# Patient Record
Sex: Male | Born: 1960 | Race: Black or African American | Hispanic: No | Marital: Single | Smoking: Current every day smoker
Health system: Southern US, Community
[De-identification: ages and names within clinical notes are randomized; demographics above are authoritative.]

## PROBLEM LIST (undated history)

## (undated) DIAGNOSIS — I10 Essential (primary) hypertension: Secondary | ICD-10-CM

## (undated) DIAGNOSIS — K852 Alcohol induced acute pancreatitis without necrosis or infection: Secondary | ICD-10-CM

## (undated) DIAGNOSIS — F1010 Alcohol abuse, uncomplicated: Secondary | ICD-10-CM

---

## 1999-12-11 ENCOUNTER — Emergency Department (HOSPITAL_COMMUNITY): Admission: EM | Admit: 1999-12-11 | Discharge: 1999-12-11 | Payer: Self-pay | Admitting: Emergency Medicine

## 2003-07-14 ENCOUNTER — Emergency Department (HOSPITAL_COMMUNITY): Admission: EM | Admit: 2003-07-14 | Discharge: 2003-07-14 | Payer: Self-pay

## 2018-01-30 ENCOUNTER — Emergency Department (HOSPITAL_COMMUNITY): Payer: BLUE CROSS/BLUE SHIELD

## 2018-01-30 ENCOUNTER — Emergency Department (HOSPITAL_COMMUNITY)
Admission: EM | Admit: 2018-01-30 | Discharge: 2018-01-30 | Disposition: A | Discharge status: Home / Self Care | Payer: BLUE CROSS/BLUE SHIELD | Attending: Emergency Medicine | Admitting: Emergency Medicine

## 2018-01-30 ENCOUNTER — Other Ambulatory Visit: Payer: Self-pay

## 2018-01-30 ENCOUNTER — Encounter (HOSPITAL_COMMUNITY): Payer: Self-pay | Admitting: *Deleted

## 2018-01-30 DIAGNOSIS — R221 Localized swelling, mass and lump, neck: Secondary | ICD-10-CM

## 2018-01-30 DIAGNOSIS — I159 Secondary hypertension, unspecified: Secondary | ICD-10-CM

## 2018-01-30 DIAGNOSIS — I10 Essential (primary) hypertension: Secondary | ICD-10-CM | POA: Diagnosis not present

## 2018-01-30 DIAGNOSIS — M25511 Pain in right shoulder: Secondary | ICD-10-CM | POA: Insufficient documentation

## 2018-01-30 HISTORY — DX: Essential (primary) hypertension: I10

## 2018-01-30 LAB — BASIC METABOLIC PANEL
ANION GAP: 13 (ref 5–15)
BUN: 7 mg/dL (ref 6–20)
CHLORIDE: 99 mmol/L — AB (ref 101–111)
CO2: 24 mmol/L (ref 22–32)
Calcium: 9.9 mg/dL (ref 8.9–10.3)
Creatinine, Ser: 0.61 mg/dL (ref 0.61–1.24)
GFR calc Af Amer: >60 mL/min (ref >60)
GLUCOSE: 132 mg/dL — AB (ref 65–99)
POTASSIUM: 3.5 mmol/L (ref 3.5–5.1)
Sodium: 136 mmol/L (ref 135–145)

## 2018-01-30 LAB — I-STAT TROPONIN, ED: Troponin i, poc: 0.01 ng/mL (ref 0.00–0.08)

## 2018-01-30 LAB — CBC
HEMATOCRIT: 45.8 % (ref 39.0–52.0)
HEMOGLOBIN: 15.8 g/dL (ref 13.0–17.0)
MCH: 31.2 pg (ref 26.0–34.0)
MCHC: 34.5 g/dL (ref 30.0–36.0)
MCV: 90.3 fL (ref 78.0–100.0)
Platelets: 257 10*3/uL (ref 150–400)
RBC: 5.07 MIL/uL (ref 4.22–5.81)
RDW: 13.1 % (ref 11.5–15.5)
WBC: 3.8 10*3/uL — ABNORMAL LOW (ref 4.0–10.5)

## 2018-01-30 LAB — TSH: TSH: 0.913 u[IU]/mL (ref 0.350–4.500)

## 2018-01-30 LAB — T4, FREE: Free T4: 0.78 ng/dL (ref 0.61–1.12)

## 2018-01-30 MED ORDER — HYDROCHLOROTHIAZIDE 25 MG PO TABS
25.0000 mg | ORAL_TABLET | Freq: Every day | ORAL | Status: DC
  Administered 2018-01-30: 25 mg via ORAL
  Filled 2018-01-30: qty 1

## 2018-01-30 MED ORDER — HYDROCHLOROTHIAZIDE 25 MG PO TABS: 25.0000 mg | ORAL_TABLET | Freq: Every day | ORAL | 0 refills | Status: DC

## 2018-01-30 NOTE — ED Provider Notes (Signed)
[facility]  CSN: [id number] Arrival date & time: 01/30/18 1322  Chief Complaint(s) Hypertension  HPI Rochele RaringJerry L Vader is a 57 y.o. male with a history of uncontrolled hypertension presents to the emergency department from urgent care for significant hypotension with systolics in the 200s.  He presented to urgent care for right shoulder pain that has been ongoing for 2 weeks after he hit it on a door frame while walking.  Pain is exacerbated with palpation of the posterior right shoulder and range of motion.  Denies any additional trauma.  Denies any chest pain, shortness of breath, headache, abdominal pain swelling.  Denies any other physical complaints.  Plain film was obtained at the urgent care which also noted rightward deviation of the trachea.  Patient denies any known thyroid problems.  Denies any trouble swallowing or respiratory distress.  Patient states that he used to be on hydrochlorothiazide.  Stop taking it due to losing his primary care provider.  HPI  Right should pain, hit it 2 weeks ago.  UC noted HTN and right trache deviation on CXR Past Medical History Past Medical History:  Diagnosis Date  . Hypertension    There are no active problems to display for this patient.  Home Medication(s) Prior to Admission medications   Medication Sig Start Date End Date Taking? Authorizing Provider  acetaminophen (TYLENOL) 650 MG CR tablet Take 650 mg by mouth every 8 (eight) hours as needed for pain.   Yes [provider]  Multiple Vitamins-Minerals (MULTIVITAMIN WITH MINERALS) tablet Take 1 tablet by mouth daily.   Yes [provider]  Omega-3 Fatty Acids (FISH OIL) 1000 MG CAPS Take 2,000 mg by mouth daily.   Yes [provider]  hydrochlorothiazide (HYDRODIURIL) 25 MG tablet Take 1 tablet (25 mg total) by mouth daily. 01/30/18 03/01/18  Nira Connardama, Jc Veron Eduardo, MD                                                                                                Past Surgical History History reviewed. No pertinent surgical history. Family History No family history on file.  Social History Social History   Tobacco Use  . Smoking status: Not on file  Substance Use Topics  . Alcohol use: Not on file  . Drug use: Not on file   Allergies Patient has no known allergies.  Review of Systems Review of Systems All other systems are reviewed and are negative for acute change except as noted in the HPI  Physical Exam Vital Signs  I have reviewed the triage vital signs BP (!) 173/92   Pulse (!) 120   Temp 98.5 F (36.9 C)   Resp (!) 26   SpO2 99%   Physical Exam  Constitutional: He is oriented to person, place, and time. He appears well-developed and well-nourished. No distress.  HENT:  Head: Normocephalic and atraumatic.  Nose: Nose normal.  Eyes: Conjunctivae and EOM are normal. Pupils are equal, round, and reactive to light. Right eye exhibits no discharge. Left eye exhibits no discharge. No scleral icterus.  Neck: Normal  range of motion. Neck supple. Thyroid mass (just left of CT cartilage) present.  Cardiovascular: Normal rate and regular rhythm. Exam reveals no gallop and no friction rub.  No murmur heard. Pulmonary/Chest: Effort normal and breath sounds normal. No stridor. No respiratory distress. He has no rales.  Abdominal: Soft. He exhibits no distension. There is no tenderness.  Musculoskeletal: He exhibits no edema.       Right shoulder: He exhibits tenderness (mild. to posterior deltoid.). He exhibits normal range of motion, no bony tenderness, no crepitus and no deformity.  Neurological: He is alert and oriented to person, place, and time.  Skin: Skin is warm and dry. No rash noted. He is not diaphoretic. No erythema.  Psychiatric: He has a normal mood and affect.  Vitals reviewed.   ED Results and Treatments Labs (all labs  ordered are listed, but only abnormal results are displayed) Labs Reviewed  BASIC METABOLIC PANEL - Abnormal; Notable for the following components:      Result Value   Chloride 99 (*)    Glucose, Bld 132 (*)    All other components within normal limits  CBC - Abnormal; Notable for the following components:   WBC 3.8 (*)    All other components within normal limits  TSH  T4, FREE  I-STAT TROPONIN, ED                                                                                                                         EKG  EKG Interpretation  Date/Time:  Tuesday January 30 2018 13:33:29 EST Ventricular Rate:  112 PR Interval:  196 QRS Duration: 86 QT Interval:  326 QTC Calculation: 444 R Axis:   -55 Text Interpretation:  Sinus tachycardia Left anterior fascicular block Minimal voltage criteria for LVH, may be normal variant Cannot rule out Anterior infarct , age undetermined Abnormal ECG NO STEMI No old tracing to compare Confirmed by Drema Pry [phone]) on 01/30/2018 1:58:50 PM      Radiology No results found. Pertinent labs & imaging results that were available during my care of the patient were reviewed by me and considered in my medical decision making (see chart for details).  Medications Ordered in ED Medications - No data to display  Procedures Procedures  (including critical care time)  Medical Decision Making / ED Course I have reviewed the nursing notes for this encounter and the patient's prior records (if available in EHR or on provided paperwork).    Patient here with asymptomatic hypertension.  Labs without evidence of endorgan damage.  Plain film without acute injuries.  Nodular thyroid noted with normal thyroid panel. Patient was given home dose of hydrochlorothiazide with improved blood pressures. Recommended he establish care  and follow-up with a primary care provider for continued management of his pressure and further workup and management of possible thyroid nodule.  The patient appears reasonably screened and/or stabilized for discharge and I doubt any other medical condition or other [facility] requiring further screening, evaluation, or treatment in the ED at this time prior to discharge.  The patient is safe for discharge with strict return precautions.   Final Clinical Impression(s) / ED Diagnoses Final diagnoses:  Secondary hypertension  Mass of thyroid region   Disposition: Discharge  Condition: Good  I have discussed the results, Dx and Tx plan with the patient who expressed understanding and agree(s) with the plan. Discharge instructions discussed at great length. The patient was given strict return precautions who verbalized understanding of the instructions. No further questions at time of discharge.    ED Discharge Orders        Ordered    hydrochlorothiazide (HYDRODIURIL) 25 MG tablet  Daily     01/30/18 1603       Follow Up: Primary care provider  Call  For close follow up to assess for the thyroid mass noted during your visit      This chart was dictated using voice recognition software.  Despite best efforts to proofread,  errors can occur which can change the documentation meaning.   Nira Conn, MD 01/31/18 [phone]

## 2018-01-30 NOTE — ED Triage Notes (Signed)
Pt reports having elevated BP and rt shoulder and neck pain. [facility] Pt reports this has been ongoing for 1 week. Pt states he has not taken medication in >876months.

## 2018-01-30 NOTE — ED Notes (Signed)
Patient transported to X-ray 

## 2019-01-07 IMAGING — DX DG CHEST 2V
2 series · 2 of 2 positions shown · non-contrast
Comparison: None.

CLINICAL DATA: Chest pain

EXAM:
CHEST  2 VIEW

[chest pa]
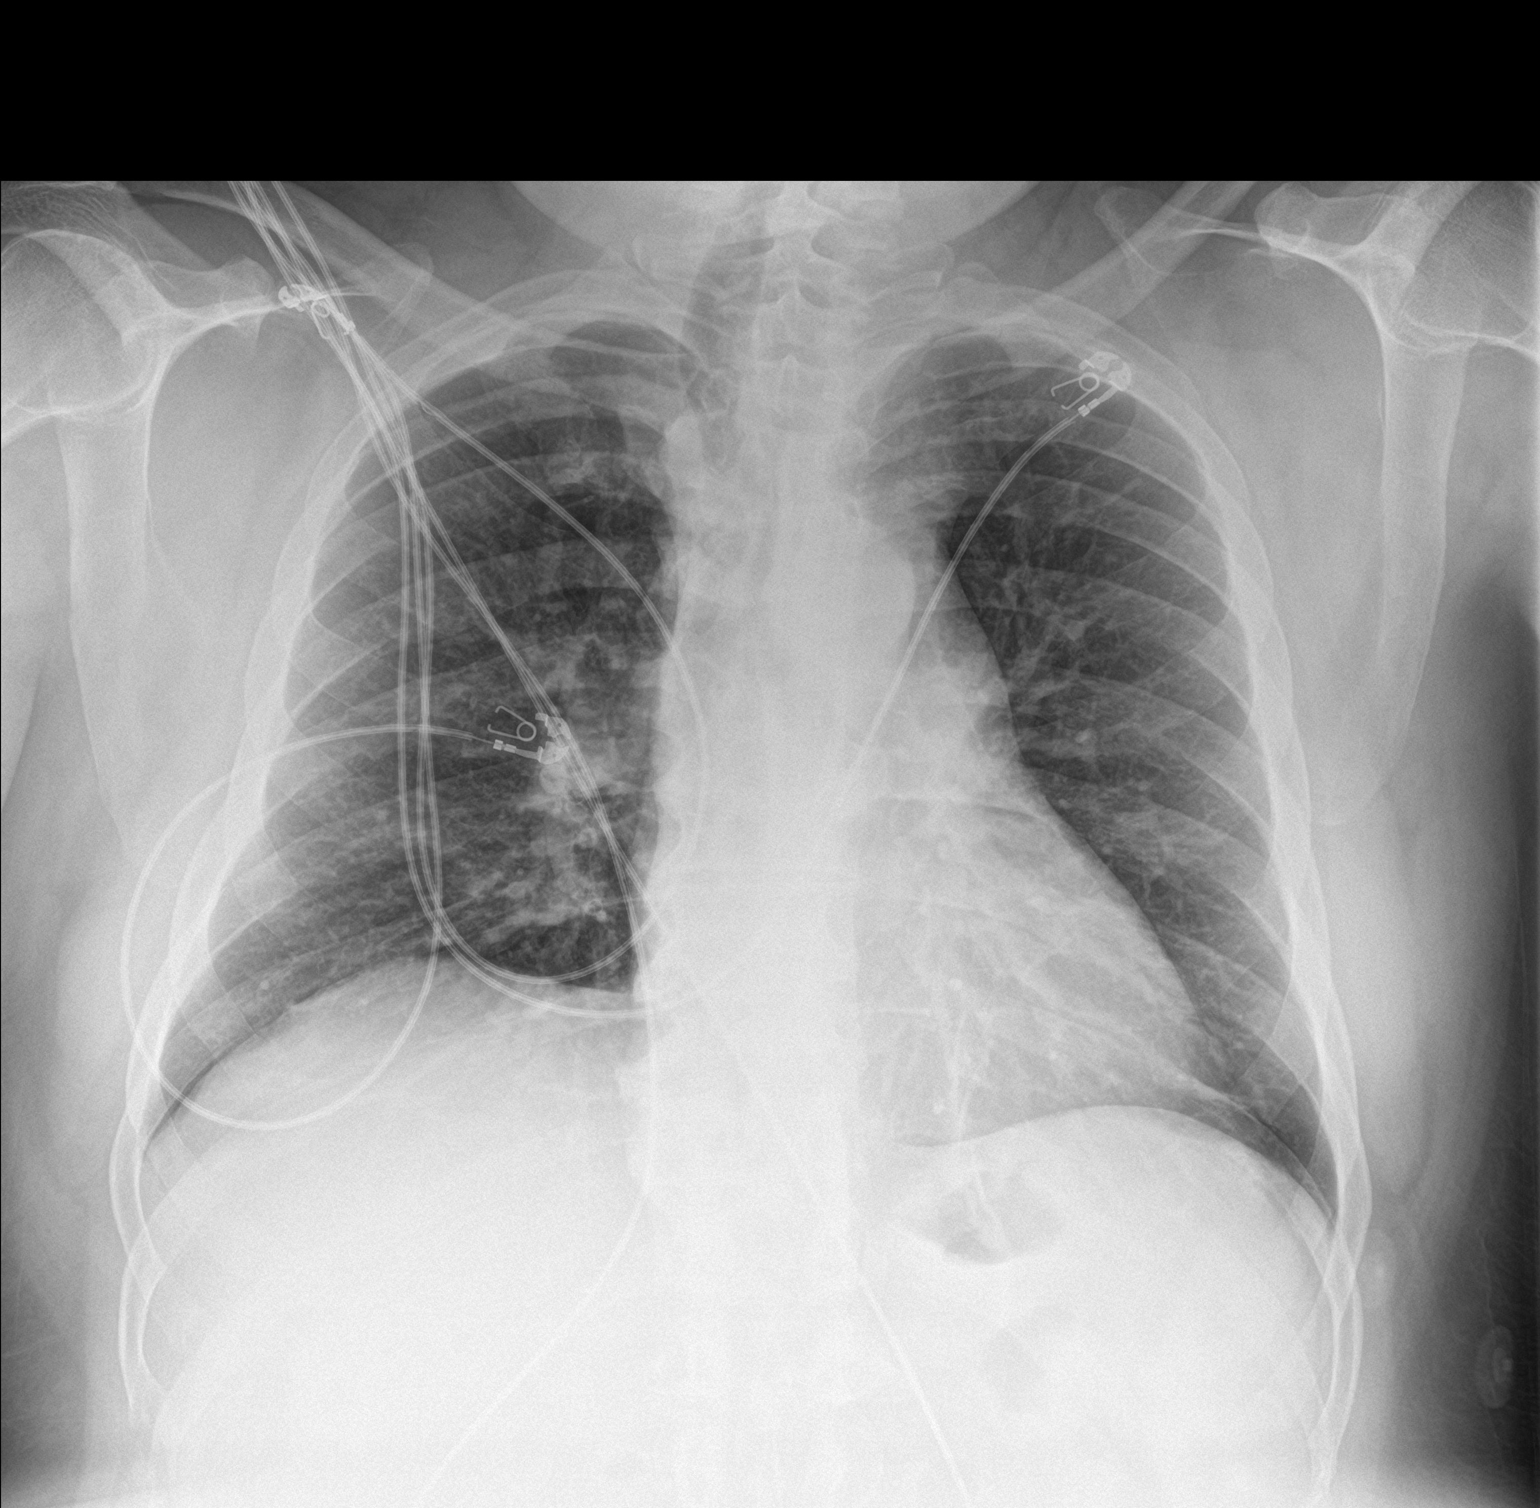

[chest lat]
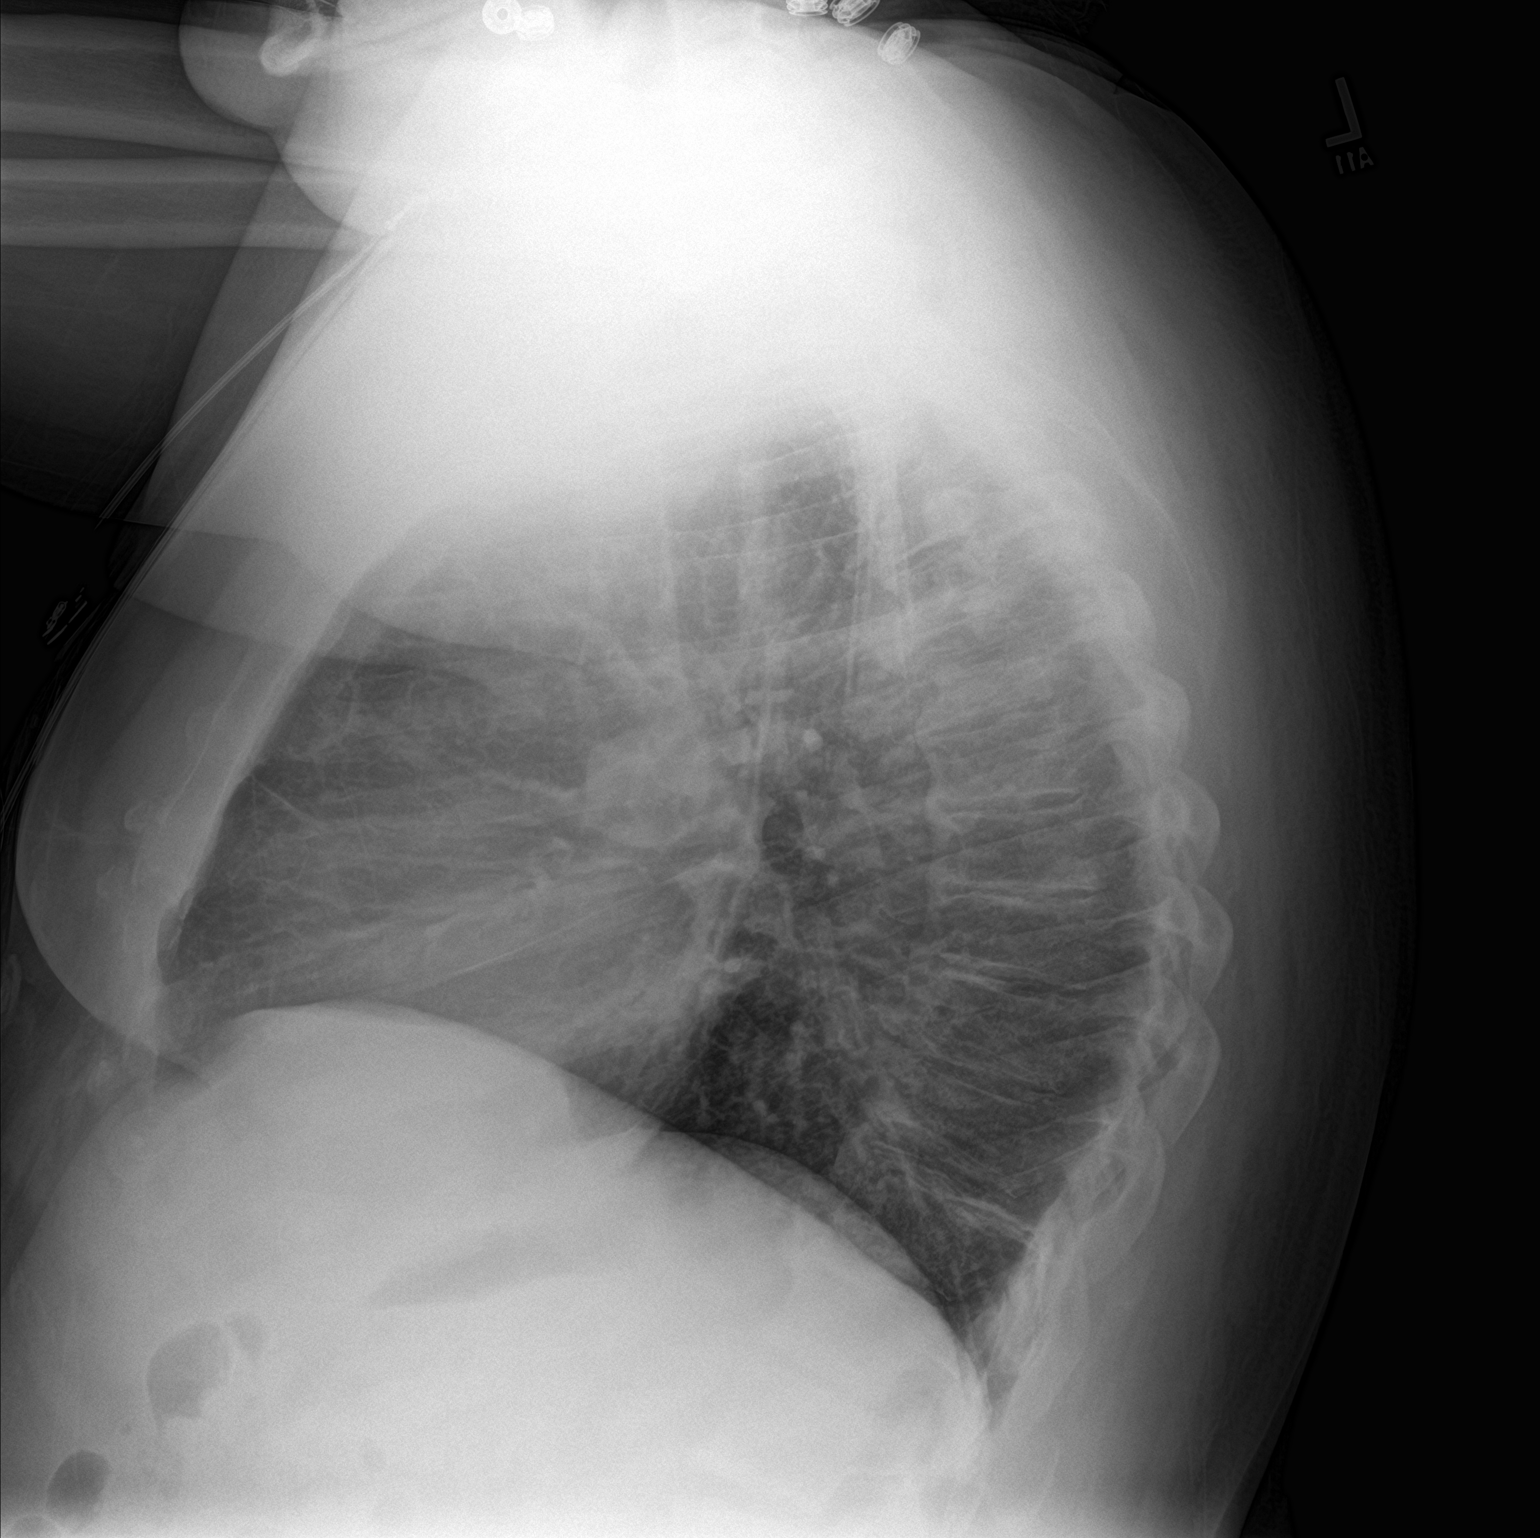

[2 of 2 positions shown; findings below may reference images not displayed]

FINDINGS: There is no edema or consolidation. The heart size and pulmonary
vascularity are normal. No adenopathy. There is degenerative change
in the thoracic spine. There is rightward deviation of the upper
thoracic trachea.
IMPRESSION: No edema or consolidation. Rightward deviation of the upper thoracic
trachea noted. Suspect thyroid enlargement causing this deviation.

## 2023-03-31 ENCOUNTER — Emergency Department (HOSPITAL_COMMUNITY): Payer: Self-pay

## 2023-03-31 ENCOUNTER — Inpatient Hospital Stay (HOSPITAL_COMMUNITY)
Admission: EM | Admit: 2023-03-31 | Discharge: 2023-04-05 | DRG: 439 | Disposition: A | Discharge status: Home / Self Care | Payer: Self-pay | Attending: Internal Medicine | Admitting: Internal Medicine

## 2023-03-31 ENCOUNTER — Other Ambulatory Visit: Payer: Self-pay

## 2023-03-31 DIAGNOSIS — Z5982 Transportation insecurity: Secondary | ICD-10-CM

## 2023-03-31 DIAGNOSIS — E876 Hypokalemia: Secondary | ICD-10-CM | POA: Insufficient documentation

## 2023-03-31 DIAGNOSIS — N200 Calculus of kidney: Secondary | ICD-10-CM | POA: Diagnosis present

## 2023-03-31 DIAGNOSIS — N400 Enlarged prostate without lower urinary tract symptoms: Secondary | ICD-10-CM | POA: Diagnosis present

## 2023-03-31 DIAGNOSIS — R7401 Elevation of levels of liver transaminase levels: Secondary | ICD-10-CM | POA: Insufficient documentation

## 2023-03-31 DIAGNOSIS — R680 Hypothermia, not associated with low environmental temperature: Secondary | ICD-10-CM | POA: Diagnosis present

## 2023-03-31 DIAGNOSIS — R599 Enlarged lymph nodes, unspecified: Secondary | ICD-10-CM | POA: Diagnosis present

## 2023-03-31 DIAGNOSIS — W19XXXA Unspecified fall, initial encounter: Secondary | ICD-10-CM | POA: Diagnosis present

## 2023-03-31 DIAGNOSIS — Z781 Physical restraint status: Secondary | ICD-10-CM

## 2023-03-31 DIAGNOSIS — R296 Repeated falls: Secondary | ICD-10-CM | POA: Diagnosis present

## 2023-03-31 DIAGNOSIS — K8500 Idiopathic acute pancreatitis without necrosis or infection: Secondary | ICD-10-CM

## 2023-03-31 DIAGNOSIS — E669 Obesity, unspecified: Secondary | ICD-10-CM | POA: Diagnosis present

## 2023-03-31 DIAGNOSIS — K7460 Unspecified cirrhosis of liver: Secondary | ICD-10-CM | POA: Diagnosis present

## 2023-03-31 DIAGNOSIS — K8080 Other cholelithiasis without obstruction: Secondary | ICD-10-CM

## 2023-03-31 DIAGNOSIS — R791 Abnormal coagulation profile: Secondary | ICD-10-CM | POA: Diagnosis present

## 2023-03-31 DIAGNOSIS — F1010 Alcohol abuse, uncomplicated: Secondary | ICD-10-CM | POA: Insufficient documentation

## 2023-03-31 DIAGNOSIS — Z7141 Alcohol abuse counseling and surveillance of alcoholic: Secondary | ICD-10-CM

## 2023-03-31 DIAGNOSIS — F10139 Alcohol abuse with withdrawal, unspecified: Secondary | ICD-10-CM | POA: Diagnosis not present

## 2023-03-31 DIAGNOSIS — I700 Atherosclerosis of aorta: Secondary | ICD-10-CM | POA: Diagnosis present

## 2023-03-31 DIAGNOSIS — T68XXXA Hypothermia, initial encounter: Secondary | ICD-10-CM | POA: Insufficient documentation

## 2023-03-31 DIAGNOSIS — I10 Essential (primary) hypertension: Secondary | ICD-10-CM | POA: Insufficient documentation

## 2023-03-31 DIAGNOSIS — E8729 Other acidosis: Secondary | ICD-10-CM | POA: Insufficient documentation

## 2023-03-31 DIAGNOSIS — E46 Unspecified protein-calorie malnutrition: Secondary | ICD-10-CM

## 2023-03-31 DIAGNOSIS — R5381 Other malaise: Secondary | ICD-10-CM | POA: Diagnosis present

## 2023-03-31 DIAGNOSIS — E8809 Other disorders of plasma-protein metabolism, not elsewhere classified: Secondary | ICD-10-CM | POA: Insufficient documentation

## 2023-03-31 DIAGNOSIS — R410 Disorientation, unspecified: Secondary | ICD-10-CM | POA: Diagnosis not present

## 2023-03-31 DIAGNOSIS — K8020 Calculus of gallbladder without cholecystitis without obstruction: Secondary | ICD-10-CM | POA: Diagnosis present

## 2023-03-31 DIAGNOSIS — I878 Other specified disorders of veins: Secondary | ICD-10-CM | POA: Diagnosis present

## 2023-03-31 DIAGNOSIS — K760 Fatty (change of) liver, not elsewhere classified: Secondary | ICD-10-CM | POA: Diagnosis present

## 2023-03-31 DIAGNOSIS — E041 Nontoxic single thyroid nodule: Secondary | ICD-10-CM | POA: Insufficient documentation

## 2023-03-31 DIAGNOSIS — R6510 Systemic inflammatory response syndrome (SIRS) of non-infectious origin without acute organ dysfunction: Secondary | ICD-10-CM | POA: Diagnosis present

## 2023-03-31 DIAGNOSIS — Z79899 Other long term (current) drug therapy: Secondary | ICD-10-CM

## 2023-03-31 DIAGNOSIS — K859 Acute pancreatitis without necrosis or infection, unspecified: Principal | ICD-10-CM | POA: Diagnosis present

## 2023-03-31 DIAGNOSIS — J9811 Atelectasis: Secondary | ICD-10-CM | POA: Diagnosis present

## 2023-03-31 LAB — COMPREHENSIVE METABOLIC PANEL
ALT: 53 U/L — ABNORMAL HIGH (ref 0–44)
AST: 180 U/L — ABNORMAL HIGH (ref 15–41)
Albumin: 2.7 g/dL — ABNORMAL LOW (ref 3.5–5.0)
Alkaline Phosphatase: 124 U/L (ref 38–126)
Anion gap: 18 — ABNORMAL HIGH (ref 5–15)
BUN: 8 mg/dL (ref 6–20)
CO2: 19 mmol/L — ABNORMAL LOW (ref 22–32)
Calcium: 7.9 mg/dL — ABNORMAL LOW (ref 8.9–10.3)
Chloride: 100 mmol/L (ref 98–111)
Creatinine, Ser: 0.63 mg/dL (ref 0.61–1.24)
GFR, Estimated: >60 mL/min (ref >60)
Glucose, Bld: 83 mg/dL (ref 70–99)
Potassium: 3 mmol/L — ABNORMAL LOW (ref 3.5–5.1)
Sodium: 137 mmol/L (ref 135–145)
Total Bilirubin: 7.5 mg/dL — ABNORMAL HIGH (ref 0.3–1.2)
Total Protein: 7.9 g/dL (ref 6.5–8.1)

## 2023-03-31 LAB — CBC WITH DIFFERENTIAL/PLATELET
Abs Immature Granulocytes: 0.03 10*3/uL (ref 0.00–0.07)
Basophils Absolute: 0.1 10*3/uL (ref 0.0–0.1)
Basophils Relative: 1 %
Eosinophils Absolute: 0 10*3/uL (ref 0.0–0.5)
Eosinophils Relative: 0 %
HCT: 38.9 % — ABNORMAL LOW (ref 39.0–52.0)
Hemoglobin: 13.5 g/dL (ref 13.0–17.0)
Immature Granulocytes: 1 %
Lymphocytes Relative: 12 %
Lymphs Abs: 0.6 10*3/uL — ABNORMAL LOW (ref 0.7–4.0)
MCH: 32.8 pg (ref 26.0–34.0)
MCHC: 34.7 g/dL (ref 30.0–36.0)
MCV: 94.6 fL (ref 80.0–100.0)
Monocytes Absolute: 0.3 10*3/uL (ref 0.1–1.0)
Monocytes Relative: 6 %
Neutro Abs: 3.9 10*3/uL (ref 1.7–7.7)
Neutrophils Relative %: 80 %
Platelets: 282 10*3/uL (ref 150–400)
RBC: 4.11 MIL/uL — ABNORMAL LOW (ref 4.22–5.81)
RDW: 13.8 % (ref 11.5–15.5)
WBC: 5 10*3/uL (ref 4.0–10.5)
nRBC: 0 % (ref 0.0–0.2)

## 2023-03-31 LAB — LIPASE, BLOOD: Lipase: 368 U/L — ABNORMAL HIGH (ref 11–51)

## 2023-03-31 MED ORDER — SODIUM CHLORIDE 0.9 % IV BOLUS
500.0000 mL | Freq: Once | INTRAVENOUS | Status: AC
  Administered 2023-03-31: 500 mL via INTRAVENOUS

## 2023-03-31 MED ORDER — SODIUM CHLORIDE (PF) 0.9 % IJ SOLN
INTRAMUSCULAR | Status: AC
  Filled 2023-03-31: qty 100

## 2023-03-31 MED ORDER — IOHEXOL 350 MG/ML SOLN
100.0000 mL | Freq: Once | INTRAVENOUS | Status: AC | PRN
  Administered 2023-03-31: 100 mL via INTRAVENOUS

## 2023-03-31 MED ORDER — SODIUM CHLORIDE 0.9 % IV BOLUS
1000.0000 mL | Freq: Once | INTRAVENOUS | Status: AC
  Administered 2023-03-31: 1000 mL via INTRAVENOUS

## 2023-03-31 NOTE — ED Provider Notes (Signed)
[place] EMERGENCY DEPARTMENT AT [facility] Provider Note   CSN: [id number] Arrival date & time: 03/31/23  1418     History  Chief Complaint  Patient presents with   Marletta Lor    LANDIN TALLON is a 62 y.o. male.  HPI   62 year old male with only reported medical history of HTN presents emergency department after reported fall 2 days ago with worsening weakness/debility.  Patient and his family member at bedside are poor historians.  Sometimes have contradicting stories.  Patient states that 2 days ago on Wednesday when he tried to get up from the commode in the morning he had a fall down onto his back.  He states he did not hit his head, there was no loss of consciousness.  However he was able to get off the ground by himself.  He typically ambulates independently.  He states that his son and wife had to help him into the living room which is where he has been for the past 2 days.  However he states he has been eating, drinking and compliant with his HTN medications.  He endorses generalized weakness and the family member at bedside has concern for "patient not seeming like himself".  The patient's sister is on her way and apparently has a more accurate history which I will attempt to get.  Home Medications Prior to Admission medications   Medication Sig Start Date End Date Taking? Authorizing Provider  acetaminophen (TYLENOL) 650 MG CR tablet Take 650 mg by mouth every 8 (eight) hours as needed for pain.    [provider]  hydrochlorothiazide (HYDRODIURIL) 25 MG tablet Take 1 tablet (25 mg total) by mouth daily. 01/30/18 03/01/18  Nira Conn, MD  Multiple Vitamins-Minerals (MULTIVITAMIN WITH MINERALS) tablet Take 1 tablet by mouth daily.    [provider]  Omega-3 Fatty Acids (FISH OIL) 1000 MG CAPS Take 2,000 mg by mouth daily.    [provider]      Allergies    Patient has no known allergies.    Review of Systems   Review of  Systems  Constitutional:  Positive for appetite change and fatigue. Negative for fever.  Respiratory:  Negative for shortness of breath.   Cardiovascular:  Negative for chest pain.  Gastrointestinal:  Negative for abdominal pain, diarrhea and vomiting.  Genitourinary:  Positive for frequency.  Musculoskeletal:  Negative for back pain, myalgias and neck pain.  Skin:  Negative for rash.  Neurological:  Negative for dizziness, syncope, numbness and headaches.    Physical Exam Updated Vital Signs BP (!) 156/91 (BP Location: Right Arm)   Pulse (!) 103   Temp (!) 93 F (33.9 C) (Rectal)   Resp 18   SpO2 98%  Physical Exam Vitals and nursing note reviewed.  Constitutional:      Appearance: He is obese.  HENT:     Head: Normocephalic.     Mouth/Throat:     Mouth: Mucous membranes are dry.  Eyes:     Extraocular Movements: Extraocular movements intact.     Pupils: Pupils are equal, round, and reactive to light.  Cardiovascular:     Rate and Rhythm: Tachycardia present.  Pulmonary:     Effort: Pulmonary effort is normal. No respiratory distress.     Comments: Decreased breath sounds b/l, exam limited by body habitus  Abdominal:     Palpations: Abdomen is soft.     Tenderness: There is no abdominal tenderness.  Musculoskeletal:  General: No deformity.     Cervical back: No rigidity or tenderness.     Comments: Chronic edema and skin breakdown of BLE with no open or weeping wounds, legs appear and feel vascularly in tact  Skin:    General: Skin is warm.  Neurological:     Mental Status: He is alert and oriented to person, place, and time. Mental status is at baseline.  Psychiatric:        Mood and Affect: Mood normal.     ED Results / Procedures / Treatments   Labs (all labs ordered are listed, but only abnormal results are displayed) Labs Reviewed  CBC WITH DIFFERENTIAL/PLATELET  COMPREHENSIVE METABOLIC PANEL  URINALYSIS, ROUTINE W REFLEX MICROSCOPIC     EKG None  Radiology No results found.  Procedures Procedures    Medications Ordered in ED Medications  sodium chloride 0.9 % bolus 500 mL (has no administration in time range)    ED Course/ Medical Decision Making/ A&P                             Medical Decision Making Amount and/or Complexity of Data Reviewed Labs: ordered. Radiology: ordered.  Risk Prescription drug management. Decision regarding hospitalization.   62 year old male presents emergency department with fall 2 days ago, weakness and general decline.  He is tachycardic on arrival, stable blood pressure, hypothermic.  Patient is a poor historian.  No acute traumatic findings on exam.  Blood work shows transaminitis consistent with daily alcohol use.  However he has an elevated bilirubin over 7. Presumed new.  CT imaging shows no traumatic injury however does identify pancreatitis, lipase is slightly elevated.  There is also identified cholelithiasis with gallbladder wall thickening.  CBD is measuring normal on ultrasound.  However given these findings consulted on-call gastroenterologist, Dr. Dulce Sellar.  He agrees with hospitalist admission and MRCP tomorrow for further evaluation.  Patient appears rather asymptomatic in regards to pancreatitis/gallbladder inflammation.  However he is amendable to staying, continues to be tachycardic, will continue with IV hydration and admit to hospitalist.  Patients evaluation and results requires admission for further treatment and care.  Spoke with hospitalist, reviewed patient's ED course and they accept admission.  Patient agrees with admission plan, offers no new complaints and is stable/unchanged at time of admit.        Final Clinical Impression(s) / ED Diagnoses Final diagnoses:  None    Rx / DC Orders ED Discharge Orders     None         Rozelle Logan, DO 03/31/23 2347

## 2023-03-31 NOTE — ED Notes (Signed)
Pt being transported to CT

## 2023-03-31 NOTE — H&P (Incomplete)
History and Physical    [name]: Brian [name], Brian [name]  [name] coming from: Home  Chief Complaint:  Chief Complaint  [name] presents with   Fall   HPI: Brian [name] is a 62 y.o. male with medical history significant of hypertension who presents to the emergency department due to increasing weakness which started about 2 days ago after sustaining a fall.  [name] was a poor historian and most of the history was obtained from ED physician and ED medical record.  [name] report, [name] sustained a fall to get up from the commode about 2 days ago, he fell on his back without hitting his head and there was Brian LOC.  He was able to eventually get up by himself, though, the wife and son had to help him to the living room he has been staying since the last 2 days he complained of weakness last alcohol intake was yesterday (2 shots of whiskey).  ED Course:  In the emergency department, temperature was 12F, he was tachycardic, BP was 156/91, respiratory rate 18/min, pulse 73 bpm.  O2 sat was 98% on room air.  Workup in the ED showed normal CBC except for hematocrit of 38.9.  BMP was normal except for potassium of 3.0 and bicarb of 19, albumin 2.7, AST 180, ALT 53, ALP 124, total bilirubin 7.5 with anion gap of 18, lipase 368 CT angiography chest, abdomen and pelvis with contrast showed 1. Brian pulmonary embolus. 2. A 4.6 cm left thyroid gland nodule with prevascular space mediastinal soft tissue density and enlarged lymph nodes. Recommend thyroid US  Given mediastinal findings, consider PET-CT following thyroid ultrasound. 3. Acute pancreatitis.  Brian pseudocyst formation. 4. Cholelithiasis with question associated gallbladder wall thickening. Consider right upper quadrant ultrasound for further evaluation. 5. Bilateral lower lobe atelectasis. Superimposed developing infection not fully  excluded. Followup PA and lateral chest X-ray is recommended in 3-4 weeks following therapy to ensure resolution. 6. Other imaging findings of potential clinical significance: Tiny hiatal hernia. Hepatic steatosis and hepatomegaly. Nonobstructive right nephrolithiasis measuring up to 5 mm. Colonic diverticulosis with Brian acute diverticulitis. Prostatomegaly. Trace right fat containing inguinal hernia. Small fat containing left inguinal hernia. Aortic Atherosclerosis 7. Brian acute intrathoracic, intra-abdominal, intrapelvic traumatic injury. 8. Please see separately dictated CT thoracolumbar spine 03/31/2023. CT cervical, thoracic and lumbar spine without contrast showed o acute displaced fracture or traumatic listhesis of the cervical, thoracic, lumbar spine. RUQ ultrasound showed Gallbladder filled with gallstones.  Gallbladder wall thickening"-5 mm, nonspecific.  Negative sonographic Murphy sign.  Brian biliary dilatation.  Hepatic steatosis IV hydration was provided.  Hospitalist was asked admit [name] for further evaluation and management.  Review of Systems: Review of systems as noted in the HPI. All other systems reviewed and are negative.   Past Medical History:  Diagnosis Date   Hypertension    Brian past surgical history on file.  Social History:  has Brian history on file for tobacco use, alcohol use, and drug use.   Brian Known Allergies  Family history: Brian pertinent family history   Prior to Admission medications   Medication Sig Start Date End Date Taking? Authorizing Provider  acetaminophen (TYLENOL) 650 MG CR tablet Take 650 mg by mouth every 8 (eight) hours as needed for pain.    [provider]  hydrochlorothiazide (HYDRODIURIL) 25 MG tablet Take 1 tablet (25 mg total) by mouth daily. 01/30/18  03/01/18  CardamaAmadeo Garnet, MD  Multiple Vitamins-Minerals (MULTIVITAMIN WITH MINERALS) tablet Take 1 tablet by mouth daily.    [provider]  Omega-3 Fatty Acids  (FISH OIL) 1000 MG CAPS Take 2,000 mg by mouth daily.    [provider]    Physical Exam: BP 133/75 (BP Location: Right Arm)   Pulse (!) 108   Temp 98.7 F (37.1 C) (Oral)   Resp 18   SpO2 100%   General: 62 y.o. year-old male ill appearing, but in Brian acute distress.  Alert and oriented x3. HEENT: NCAT, EOMI, dry mucous membrane. Neck: Supple, trachea medial Cardiovascular: Tachycardia.  Regular rate and rhythm with Brian rubs or gallops.  Brian thyromegaly or JVD noted.  +2 lower extremity edema bilaterally. 2/4 pulses in all 4 extremities. Respiratory: Clear to auscultation with Brian wheezes or rales. Good inspiratory effort. Abdomen: Soft, nontender nondistended with normal bowel sounds x4 quadrants. Muskuloskeletal: Brian cyanosis, clubbing  noted bilaterally Neuro: CN II-XII intact, strength 5/5 x 4, sensation, reflexes intact Skin: Bilateral chronic venous stasis.  Skin warm and dry Psychiatry: Judgement and insight appear normal. Mood is appropriate for condition and setting          Labs on Admission:  Basic Metabolic Panel: Recent Labs  Lab 03/31/23 1633  NA 137  K 3.0*  CL 100  CO2 19*  GLUCOSE 83  BUN 8  CREATININE 0.63  CALCIUM 7.9*   Liver Function Tests: Recent Labs  Lab 03/31/23 1633  AST 180*  ALT 53*  ALKPHOS 124  BILITOT 7.5*  PROT 7.9  ALBUMIN 2.7*   Recent Labs  Lab 03/31/23 1633  LIPASE 368*   Brian results for input(s): "AMMONIA" in the last 168 hours. CBC: Recent Labs  Lab 03/31/23 1633  WBC 5.0  NEUTROABS 3.9  HGB 13.5  HCT 38.9*  MCV 94.6  PLT 282   Cardiac Enzymes: Brian results for input(s): "CKTOTAL", "CKMB", "CKMBINDEX", "TROPONINI" in the last 168 hours.  BNP (last 3 results) Brian results for input(s): "BNP" in the last 8760 hours.  ProBNP (last 3 results) Brian results for input(s): "PROBNP" in the last 8760 hours.  CBG: Brian results for input(s): "GLUCAP" in the last 168 hours.  Radiological Exams on Admission: US  Abdomen Limited RUQ (LIVER/GB)  Result Date: 03/31/2023 CLINICAL DATA:  Epigastric pain. EXAM: ULTRASOUND ABDOMEN LIMITED RIGHT UPPER QUADRANT COMPARISON:  CT earlier today FINDINGS: Gallbladder: Multiple shadowing gallstones obscuring the posterior gallbladder wall. Likely gallbladder sludge. There is mild gallbladder wall thickening at 4-5 mm. Brian pericholecystic fluid. Brian sonographic Murphy sign noted by sonographer. Common bile duct: Diameter: 4-5 mm, nondilated. Liver: Heterogeneous and diffusely increased in parenchymal echogenicity. The liver parenchyma is difficult to penetrate. Brian evidence of focal lesion. Brian convincing capsular nodularity. Portal vein is patent on color Doppler imaging with normal direction of blood flow towards the liver. Other: Brian right upper quadrant ascites. IMPRESSION: 1. Gallbladder filled with gallstones. Gallbladder wall thickening of 4-5 mm, nonspecific. Negative sonographic Murphy sign. 2. Brian biliary dilatation. 3. Hepatic steatosis. Electronically Signed   By: Narda Rutherford M.D.   On: 03/31/2023 21:59   CT L-SPINE Brian CHARGE  Result Date: 03/31/2023 CLINICAL DATA:  Polytrauma, blunt.  Fall.  Diffuse abdominal pain. EXAM: CT CERVICAL, THORACIC, AND LUMBAR SPINE WITHOUT CONTRAST TECHNIQUE: Multidetector CT imaging of the cervical, thoracic and lumbar spine was performed without intravenous contrast. Multiplanar CT image reconstructions were also generated. RADIATION DOSE REDUCTION: This exam was performed according  to the departmental dose-optimization program which includes automated exposure control, adjustment of the mA and/or kV according to [name] size and/or use of iterative reconstruction technique. COMPARISON:  None Available. FINDINGS: CT CERVICAL SPINE FINDINGS Alignment: Normal. Skull base and vertebrae: Multilevel moderate degenerative changes spine. Brian associated severe osseous neural foraminal central canal stenosis. Brian acute fracture. Brian aggressive appearing  focal osseous lesion or focal pathologic process. Soft tissues and spinal canal: Brian prevertebral fluid or swelling. Brian visible canal hematoma. Upper chest: Unremarkable. Other: Left thyroid gland nodule-please see separately dictated CT chest 03/31/2023. CT THORACIC SPINE FINDINGS Alignment: Normal. Vertebrae: Severe contiguous osteophyte formation. Multilevel mild facet arthropathy. Posterior longitudinal ligament calcification. Brian acute fracture or focal pathologic process. Paraspinal and other soft tissues: Negative. Disc levels: Maintained. CT LUMBAR SPINE FINDINGS Segmentation: 5 lumbar type vertebrae. Alignment: Normal. Vertebrae: Multilevel moderate degenerative changes spine with posterior disc osteophyte complex formation at the L2-L3, L3-L4, L4-L5, L5-S1 levels. Brian associated severe osseous neural foraminal or central canal stenosis. Brian acute fracture or focal pathologic process. Paraspinal and other soft tissues: Negative. Disc levels: Maintained. Other: Moderate degenerative changes of the right hip. Atherosclerotic plaque. IMPRESSION: 1. Brian acute displaced fracture or traumatic listhesis of the cervical, thoracic, lumbar spine. 2. Thoracic findings suggestive of DISH. 3. Left thyroid gland nodule-please see separately dictated CT chest 03/31/2023. 4.  Aortic Atherosclerosis (ICD10-I70.0). Electronically Signed   By: Tish Frederickson M.D.   On: 03/31/2023 21:20   CT T-SPINE Brian CHARGE  Result Date: 03/31/2023 CLINICAL DATA:  Polytrauma, blunt.  Fall.  Diffuse abdominal pain. EXAM: CT CERVICAL, THORACIC, AND LUMBAR SPINE WITHOUT CONTRAST TECHNIQUE: Multidetector CT imaging of the cervical, thoracic and lumbar spine was performed without intravenous contrast. Multiplanar CT image reconstructions were also generated. RADIATION DOSE REDUCTION: This exam was performed according to the departmental dose-optimization program which includes automated exposure control, adjustment of the mA and/or kV according  to [name] size and/or use of iterative reconstruction technique. COMPARISON:  None Available. FINDINGS: CT CERVICAL SPINE FINDINGS Alignment: Normal. Skull base and vertebrae: Multilevel moderate degenerative changes spine. Brian associated severe osseous neural foraminal central canal stenosis. Brian acute fracture. Brian aggressive appearing focal osseous lesion or focal pathologic process. Soft tissues and spinal canal: Brian prevertebral fluid or swelling. Brian visible canal hematoma. Upper chest: Unremarkable. Other: Left thyroid gland nodule-please see separately dictated CT chest 03/31/2023. CT THORACIC SPINE FINDINGS Alignment: Normal. Vertebrae: Severe contiguous osteophyte formation. Multilevel mild facet arthropathy. Posterior longitudinal ligament calcification. Brian acute fracture or focal pathologic process. Paraspinal and other soft tissues: Negative. Disc levels: Maintained. CT LUMBAR SPINE FINDINGS Segmentation: 5 lumbar type vertebrae. Alignment: Normal. Vertebrae: Multilevel moderate degenerative changes spine with posterior disc osteophyte complex formation at the L2-L3, L3-L4, L4-L5, L5-S1 levels. Brian associated severe osseous neural foraminal or central canal stenosis. Brian acute fracture or focal pathologic process. Paraspinal and other soft tissues: Negative. Disc levels: Maintained. Other: Moderate degenerative changes of the right hip. Atherosclerotic plaque. IMPRESSION: 1. Brian acute displaced fracture or traumatic listhesis of the cervical, thoracic, lumbar spine. 2. Thoracic findings suggestive of DISH. 3. Left thyroid gland nodule-please see separately dictated CT chest 03/31/2023. 4.  Aortic Atherosclerosis (ICD10-I70.0). Electronically Signed   By: Tish Frederickson M.D.   On: 03/31/2023 21:20   CT Cervical Spine Wo Contrast  Result Date: 03/31/2023 CLINICAL DATA:  Polytrauma, blunt.  Fall.  Diffuse abdominal pain. EXAM: CT CERVICAL, THORACIC, AND LUMBAR SPINE WITHOUT CONTRAST TECHNIQUE: Multidetector  CT imaging of the cervical, thoracic and  lumbar spine was performed without intravenous contrast. Multiplanar CT image reconstructions were also generated. RADIATION DOSE REDUCTION: This exam was performed according to the departmental dose-optimization program which includes automated exposure control, adjustment of the mA and/or kV according to [name] size and/or use of iterative reconstruction technique. COMPARISON:  None Available. FINDINGS: CT CERVICAL SPINE FINDINGS Alignment: Normal. Skull base and vertebrae: Multilevel moderate degenerative changes spine. Brian associated severe osseous neural foraminal central canal stenosis. Brian acute fracture. Brian aggressive appearing focal osseous lesion or focal pathologic process. Soft tissues and spinal canal: Brian prevertebral fluid or swelling. Brian visible canal hematoma. Upper chest: Unremarkable. Other: Left thyroid gland nodule-please see separately dictated CT chest 03/31/2023. CT THORACIC SPINE FINDINGS Alignment: Normal. Vertebrae: Severe contiguous osteophyte formation. Multilevel mild facet arthropathy. Posterior longitudinal ligament calcification. Brian acute fracture or focal pathologic process. Paraspinal and other soft tissues: Negative. Disc levels: Maintained. CT LUMBAR SPINE FINDINGS Segmentation: 5 lumbar type vertebrae. Alignment: Normal. Vertebrae: Multilevel moderate degenerative changes spine with posterior disc osteophyte complex formation at the L2-L3, L3-L4, L4-L5, L5-S1 levels. Brian associated severe osseous neural foraminal or central canal stenosis. Brian acute fracture or focal pathologic process. Paraspinal and other soft tissues: Negative. Disc levels: Maintained. Other: Moderate degenerative changes of the right hip. Atherosclerotic plaque. IMPRESSION: 1. Brian acute displaced fracture or traumatic listhesis of the cervical, thoracic, lumbar spine. 2. Thoracic findings suggestive of DISH. 3. Left thyroid gland nodule-please see separately dictated CT  chest 03/31/2023. 4.  Aortic Atherosclerosis (ICD10-I70.0). Electronically Signed   By: Tish Frederickson M.D.   On: 03/31/2023 21:20   CT Angio Chest PE W/Cm &/Or Wo Cm  Result Date: 03/31/2023 CLINICAL DATA:  Pulmonary embolism (PE) suspected, low to intermediate prob, positive D-dimer; Abdominal pain, acute, nonlocalized elevated bili, hypotensive, fall 2 days ago EXAM: CT ANGIOGRAPHY CHEST CT ABDOMEN AND PELVIS WITH CONTRAST TECHNIQUE: Multidetector CT imaging of the chest was performed using the standard protocol during bolus administration of intravenous contrast. Multiplanar CT image reconstructions and MIPs were obtained to evaluate the vascular anatomy. Multidetector CT imaging of the abdomen and pelvis was performed using the standard protocol during bolus administration of intravenous contrast. RADIATION DOSE REDUCTION: This exam was performed according to the departmental dose-optimization program which includes automated exposure control, adjustment of the mA and/or kV according to [name] size and/or use of iterative reconstruction technique. CONTRAST:  OMNIPAQUE IOHEXOL 350 MG/ML SOLN COMPARISON:  None Available. FINDINGS: CTA CHEST FINDINGS Cardiovascular: Satisfactory opacification of the pulmonary arteries to the segmental level. Brian evidence of pulmonary embolism. Normal heart size. Brian significant pericardial effusion. The thoracic aorta is normal in caliber. Mild atherosclerotic plaque of the thoracic aorta. Brian coronary artery calcifications. Mediastinum/Nodes: Fat stranding and soft tissue density with enlarged lymph nodes along the low pre-vascular space (5: 66-106). Enlarged prevascular lymph node measuring 1.6 cm. Brian enlarged hilar or axillary lymph nodes. Trachea and esophagus esophagus demonstrate Brian significant findings. Tiny hiatal hernia. Enlarged left thyroid gland with a heterogeneous hypodense 4.2 x 4.6 cm thyroid gland nodule. Lungs/Pleura: Low lung volumes. Bilateral lower  lobe atelectasis. Brian focal consolidation. Brian pulmonary nodule. Brian pulmonary mass. Brian pleural effusion. Brian pneumothorax. Musculoskeletal: Brian chest wall abnormality. Brian suspicious lytic or blastic osseous lesions. Brian acute displaced fracture. Please see separately dictated CT thoracic spine 03/31/2023. Review of the MIP images confirms the above findings. CT ABDOMEN and PELVIS FINDINGS Hepatobiliary: The liver is enlarged measuring up to 21.5 cm. The hepatic parenchyma is diffusely hypodense compared to the  splenic parenchyma consistent with fatty infiltration. Brian focal liver abnormality. Calcified gallstones within the gallbladder lumen. Question gallbladder wall thickening. Brian definite pericholecystic fluid. Brian biliary dilatation. Pancreas: Peripancreatic fat stranding. Slight haziness of the pancreatic contour proximally. The pancreas appears slightly diffusely atrophic. Brian main pancreatic ductal dilatation. Spleen: Normal in size without focal abnormality. Adrenals/Urinary Tract: Brian adrenal nodule bilaterally. Bilateral kidneys enhance symmetrically. Brian hydronephrosis. Brian hydroureter. Right nephrolithiasis measuring up to 5 mm. Brian left nephrolithiasis. Brian ureterolithiasis bilaterally. The urinary bladder is unremarkable. On delayed imaging, there is Brian urothelial wall thickening and there are Brian filling defects in the opacified portions of the bilateral collecting systems or ureters. Stomach/Bowel: Stomach is within normal limits. Brian evidence of bowel wall thickening or dilatation. Colonic diverticulosis. Appendix appears normal. Vascular/Lymphatic: Brian abdominal aorta or iliac aneurysm. Moderate atherosclerotic plaque of the aorta and its branches. Brian abdominal, pelvic, or inguinal lymphadenopathy. Reproductive: The prostate is enlarged measuring up to 5 cm. Other: Brian intraperitoneal free fluid. Brian intraperitoneal free gas. Brian organized fluid collection. Musculoskeletal: Trace right fat containing inguinal hernia.  Small fat containing left inguinal hernia. Brian suspicious lytic or blastic osseous lesions. Brian acute displaced fracture. Please see separately dictated CT lumbar spine 03/31/2023. Review of the MIP images confirms the above findings. IMPRESSION: 1. Brian pulmonary embolus. 2. A 4.6 cm left thyroid gland nodule with prevascular space mediastinal soft tissue density and enlarged lymph nodes. Recommend thyroid US (ref: J Am Coll Radiol. 2015 Feb;12(2): 143-50). Given mediastinal findings, consider PET-CT following thyroid ultrasound. 3. Acute pancreatitis.  Brian pseudocyst formation. 4. Cholelithiasis with question associated gallbladder wall thickening. Consider right upper quadrant ultrasound for further evaluation. 5. Bilateral lower lobe atelectasis. Superimposed developing infection not fully excluded. Followup PA and lateral chest X-ray is recommended in 3-4 weeks following therapy to ensure resolution. 6. Other imaging findings of potential clinical significance: Tiny hiatal hernia. Hepatic steatosis and hepatomegaly. Nonobstructive right nephrolithiasis measuring up to 5 mm. Colonic diverticulosis with Brian acute diverticulitis. Prostatomegaly. Trace right fat containing inguinal hernia. Small fat containing left inguinal hernia. Aortic Atherosclerosis (ICD10-I70.0). 7. Brian acute intrathoracic, intra-abdominal, intrapelvic traumatic injury. 8. Please see separately dictated CT thoracolumbar spine 03/31/2023. Electronically Signed   By: Tish Frederickson M.D.   On: 03/31/2023 20:53   CT Abdomen Pelvis W Contrast  Result Date: 03/31/2023 CLINICAL DATA:  Pulmonary embolism (PE) suspected, low to intermediate prob, positive D-dimer; Abdominal pain, acute, nonlocalized elevated bili, hypotensive, fall 2 days ago EXAM: CT ANGIOGRAPHY CHEST CT ABDOMEN AND PELVIS WITH CONTRAST TECHNIQUE: Multidetector CT imaging of the chest was performed using the standard protocol during bolus administration of intravenous contrast.  Multiplanar CT image reconstructions and MIPs were obtained to evaluate the vascular anatomy. Multidetector CT imaging of the abdomen and pelvis was performed using the standard protocol during bolus administration of intravenous contrast. RADIATION DOSE REDUCTION: This exam was performed according to the departmental dose-optimization program which includes automated exposure control, adjustment of the mA and/or kV according to [name] size and/or use of iterative reconstruction technique. CONTRAST:  OMNIPAQUE IOHEXOL 350 MG/ML SOLN COMPARISON:  None Available. FINDINGS: CTA CHEST FINDINGS Cardiovascular: Satisfactory opacification of the pulmonary arteries to the segmental level. Brian evidence of pulmonary embolism. Normal heart size. Brian significant pericardial effusion. The thoracic aorta is normal in caliber. Mild atherosclerotic plaque of the thoracic aorta. Brian coronary artery calcifications. Mediastinum/Nodes: Fat stranding and soft tissue density with enlarged lymph nodes along the low pre-vascular space (5: 66-106). Enlarged prevascular lymph node  measuring 1.6 cm. Brian enlarged hilar or axillary lymph nodes. Trachea and esophagus esophagus demonstrate Brian significant findings. Tiny hiatal hernia. Enlarged left thyroid gland with a heterogeneous hypodense 4.2 x 4.6 cm thyroid gland nodule. Lungs/Pleura: Low lung volumes. Bilateral lower lobe atelectasis. Brian focal consolidation. Brian pulmonary nodule. Brian pulmonary mass. Brian pleural effusion. Brian pneumothorax. Musculoskeletal: Brian chest wall abnormality. Brian suspicious lytic or blastic osseous lesions. Brian acute displaced fracture. Please see separately dictated CT thoracic spine 03/31/2023. Review of the MIP images confirms the above findings. CT ABDOMEN and PELVIS FINDINGS Hepatobiliary: The liver is enlarged measuring up to 21.5 cm. The hepatic parenchyma is diffusely hypodense compared to the splenic parenchyma consistent with fatty infiltration. Brian focal liver  abnormality. Calcified gallstones within the gallbladder lumen. Question gallbladder wall thickening. Brian definite pericholecystic fluid. Brian biliary dilatation. Pancreas: Peripancreatic fat stranding. Slight haziness of the pancreatic contour proximally. The pancreas appears slightly diffusely atrophic. Brian main pancreatic ductal dilatation. Spleen: Normal in size without focal abnormality. Adrenals/Urinary Tract: Brian adrenal nodule bilaterally. Bilateral kidneys enhance symmetrically. Brian hydronephrosis. Brian hydroureter. Right nephrolithiasis measuring up to 5 mm. Brian left nephrolithiasis. Brian ureterolithiasis bilaterally. The urinary bladder is unremarkable. On delayed imaging, there is Brian urothelial wall thickening and there are Brian filling defects in the opacified portions of the bilateral collecting systems or ureters. Stomach/Bowel: Stomach is within normal limits. Brian evidence of bowel wall thickening or dilatation. Colonic diverticulosis. Appendix appears normal. Vascular/Lymphatic: Brian abdominal aorta or iliac aneurysm. Moderate atherosclerotic plaque of the aorta and its branches. Brian abdominal, pelvic, or inguinal lymphadenopathy. Reproductive: The prostate is enlarged measuring up to 5 cm. Other: Brian intraperitoneal free fluid. Brian intraperitoneal free gas. Brian organized fluid collection. Musculoskeletal: Trace right fat containing inguinal hernia. Small fat containing left inguinal hernia. Brian suspicious lytic or blastic osseous lesions. Brian acute displaced fracture. Please see separately dictated CT lumbar spine 03/31/2023. Review of the MIP images confirms the above findings. IMPRESSION: 1. Brian pulmonary embolus. 2. A 4.6 cm left thyroid gland nodule with prevascular space mediastinal soft tissue density and enlarged lymph nodes. Recommend thyroid US (ref: J Am Coll Radiol. 2015 Feb;12(2): 143-50). Given mediastinal findings, consider PET-CT following thyroid ultrasound. 3. Acute pancreatitis.  Brian pseudocyst  formation. 4. Cholelithiasis with question associated gallbladder wall thickening. Consider right upper quadrant ultrasound for further evaluation. 5. Bilateral lower lobe atelectasis. Superimposed developing infection not fully excluded. Followup PA and lateral chest X-ray is recommended in 3-4 weeks following therapy to ensure resolution. 6. Other imaging findings of potential clinical significance: Tiny hiatal hernia. Hepatic steatosis and hepatomegaly. Nonobstructive right nephrolithiasis measuring up to 5 mm. Colonic diverticulosis with Brian acute diverticulitis. Prostatomegaly. Trace right fat containing inguinal hernia. Small fat containing left inguinal hernia. Aortic Atherosclerosis (ICD10-I70.0). 7. Brian acute intrathoracic, intra-abdominal, intrapelvic traumatic injury. 8. Please see separately dictated CT thoracolumbar spine 03/31/2023. Electronically Signed   By: Tish Frederickson M.D.   On: 03/31/2023 20:53   DG Chest Port 1 View  Result Date: 03/31/2023 CLINICAL DATA:  fall EXAM: PORTABLE CHEST 1 VIEW COMPARISON:  Chest x-ray 01/30/2018 FINDINGS: The heart and mediastinal contours are unchanged. Bibasilar, right greater than left, patchy and streaky airspace opacities. Brian pulmonary edema. Brian pleural effusion. Brian pneumothorax. Brian acute osseous abnormality. IMPRESSION: Bibasilar, right greater than left, patchy and streaky airspace opacities. Finding likely combination of atelectasis versus infection/inflammation. Followup PA and lateral chest X-ray is recommended in 3-4 weeks following therapy to ensure resolution and exclude underlying malignancy. Electronically Signed  By: Tish Frederickson M.D.   On: 03/31/2023 17:19   DG Pelvis Portable  Result Date: 03/31/2023 CLINICAL DATA:  Trauma EXAM: PORTABLE PELVIS 1 VIEWS COMPARISON:  None Available. FINDINGS: Limited examination consisting of a single view of both hips demonstrates bilateral hip degenerative changes. This examination can not exclude  fractures. Brian obvious displaced fractures are seen. Pelvic ring appears to be intact. IMPRESSION: Bilateral hip degenerative changes. Evaluation for fractures was limited by lack of additional views. Electronically Signed   By: Layla Maw M.D.   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   Alcohol abuse   Hypothermia   Essential hypertension  Acute pancreatitis CT abdomen and pelvis was suggestive of acute pancreatitis, lipase was elevated at 368 He denies any abdominal pain Continue IV Zofran  p.r.n Continue Protonix Continue IV D5NS 100 ml/Hr (thiamine will be given prior to starting the drip) Continue full liquid diet with plan to advance diet as tolerated RUQ U/S showed Gallbladder filled with gallstones, Brian biliary dilatation  Hypokalemia K+ 3.0, this will be replenished  Transaminitis/ hyperbilirubinemia AST 180, ALT 53, ALP 124, total bilirubin 7.5 This is possibly related to [name]'s history of alcoholism MRCP was recommended by gastroenterologist.  [name] EDP Gastroenterologist was consulted and will follow-up in consult [name] EDP  Alcoholic/starvation ketoacidosis Bicarb was in acidic range at 19, K+ was low, CBG was within normal range Continue IV D5 NS Continue to monitor for bicarb improvement.  Potassium will be replenished  Hypoalbuminemia possibly secondary to moderate protein calorie malnutrition Albumin 2.7, protein supplement will be provided  Thyroid gland nodule CT angiography chest showed A 4.6 cm left thyroid gland nodule with prevascular space mediastinal soft tissue  density and enlarged lymph nodes. Thyroid ultrasound was recommended by radiologist  Alcohol abuse Monitor for alcohol withdrawal and consider starting CIWA protocol [name] was counseled on alcohol abuse cessation Continue Thiamine, folic acid and multivitamin  Hypothermia-resolved  Essential hypertension Continue Norvasc 5 mg p.o. daily   DVT prophylaxis: Lovenox  Code Status: Full code  Consults: Gastroenterology  Family Communication: None at bedside  Severity of Illness: The appropriate [name] status for this [name] is INPATIENT. Inpatient status is judged to be reasonable and necessary in order to provide the required intensity of service to ensure the [name]'s safety. The [name]'s presenting symptoms, physical exam findings, and initial radiographic and laboratory data in the context of their chronic comorbidities is felt to place them at high risk for further clinical deterioration. Furthermore, it is not anticipated that the [name] will be medically stable for discharge from the hospital within 2 midnights of admission.   * I certify that at the point of admission it is my clinical judgment that the [name] will require inpatient hospital care spanning beyond 2 midnights from the point of admission due to high intensity of service, high risk for further deterioration and high frequency of surveillance required.*  Author: Frankey Shown, DO 04/01/2023 4:11 AM  For on call review [email or web address].

## 2023-03-31 NOTE — ED Triage Notes (Signed)
Pt bib ems from home pt had fall 2 days ago.

## 2023-04-01 ENCOUNTER — Inpatient Hospital Stay (HOSPITAL_COMMUNITY): Payer: Self-pay

## 2023-04-01 DIAGNOSIS — K859 Acute pancreatitis without necrosis or infection, unspecified: Secondary | ICD-10-CM

## 2023-04-01 DIAGNOSIS — E8729 Other acidosis: Secondary | ICD-10-CM | POA: Insufficient documentation

## 2023-04-01 DIAGNOSIS — T68XXXA Hypothermia, initial encounter: Secondary | ICD-10-CM | POA: Insufficient documentation

## 2023-04-01 DIAGNOSIS — E876 Hypokalemia: Secondary | ICD-10-CM | POA: Insufficient documentation

## 2023-04-01 DIAGNOSIS — E8809 Other disorders of plasma-protein metabolism, not elsewhere classified: Secondary | ICD-10-CM | POA: Insufficient documentation

## 2023-04-01 DIAGNOSIS — R7401 Elevation of levels of liver transaminase levels: Secondary | ICD-10-CM | POA: Insufficient documentation

## 2023-04-01 DIAGNOSIS — E041 Nontoxic single thyroid nodule: Secondary | ICD-10-CM | POA: Insufficient documentation

## 2023-04-01 DIAGNOSIS — I10 Essential (primary) hypertension: Secondary | ICD-10-CM | POA: Insufficient documentation

## 2023-04-01 DIAGNOSIS — F1010 Alcohol abuse, uncomplicated: Secondary | ICD-10-CM | POA: Insufficient documentation

## 2023-04-01 LAB — CBC
HCT: 38 % — ABNORMAL LOW (ref 39.0–52.0)
Hemoglobin: 13.1 g/dL (ref 13.0–17.0)
MCH: 33.3 pg (ref 26.0–34.0)
MCHC: 34.5 g/dL (ref 30.0–36.0)
MCV: 96.7 fL (ref 80.0–100.0)
Platelets: 253 10*3/uL (ref 150–400)
RBC: 3.93 MIL/uL — ABNORMAL LOW (ref 4.22–5.81)
RDW: 14.3 % (ref 11.5–15.5)
WBC: 6.2 10*3/uL (ref 4.0–10.5)
nRBC: 0 % (ref 0.0–0.2)

## 2023-04-01 LAB — COMPREHENSIVE METABOLIC PANEL
ALT: 53 U/L — ABNORMAL HIGH (ref 0–44)
ALT: 53 U/L — ABNORMAL HIGH (ref 0–44)
AST: 168 U/L — ABNORMAL HIGH (ref 15–41)
AST: 178 U/L — ABNORMAL HIGH (ref 15–41)
Albumin: 2.7 g/dL — ABNORMAL LOW (ref 3.5–5.0)
Albumin: 2.7 g/dL — ABNORMAL LOW (ref 3.5–5.0)
Alkaline Phosphatase: 124 U/L (ref 38–126)
Alkaline Phosphatase: 126 U/L (ref 38–126)
Anion gap: 14 (ref 5–15)
Anion gap: 14 (ref 5–15)
BUN: 9 mg/dL (ref 6–20)
BUN: 9 mg/dL (ref 6–20)
CO2: 20 mmol/L — ABNORMAL LOW (ref 22–32)
CO2: 21 mmol/L — ABNORMAL LOW (ref 22–32)
Calcium: 7.5 mg/dL — ABNORMAL LOW (ref 8.9–10.3)
Calcium: 8 mg/dL — ABNORMAL LOW (ref 8.9–10.3)
Chloride: 103 mmol/L (ref 98–111)
Chloride: 105 mmol/L (ref 98–111)
Creatinine, Ser: 0.56 mg/dL — ABNORMAL LOW (ref 0.61–1.24)
Creatinine, Ser: 0.52 mg/dL — ABNORMAL LOW (ref 0.61–1.24)
GFR, Estimated: >60 mL/min (ref >60)
GFR, Estimated: >60 mL/min (ref >60)
Glucose, Bld: 65 mg/dL — ABNORMAL LOW (ref 70–99)
Glucose, Bld: 111 mg/dL — ABNORMAL HIGH (ref 70–99)
Potassium: 3.1 mmol/L — ABNORMAL LOW (ref 3.5–5.1)
Potassium: 3.1 mmol/L — ABNORMAL LOW (ref 3.5–5.1)
Sodium: 137 mmol/L (ref 135–145)
Sodium: 140 mmol/L (ref 135–145)
Total Bilirubin: 7.2 mg/dL — ABNORMAL HIGH (ref 0.3–1.2)
Total Bilirubin: 7.6 mg/dL — ABNORMAL HIGH (ref 0.3–1.2)
Total Protein: 7.4 g/dL (ref 6.5–8.1)
Total Protein: 7.7 g/dL (ref 6.5–8.1)

## 2023-04-01 LAB — PHOSPHORUS: Phosphorus: 4.7 mg/dL — ABNORMAL HIGH (ref 2.5–4.6)

## 2023-04-01 LAB — HIV ANTIBODY (ROUTINE TESTING W REFLEX): HIV Screen 4th Generation wRfx: NONREACTIVE

## 2023-04-01 LAB — MAGNESIUM: Magnesium: 1.8 mg/dL (ref 1.7–2.4)

## 2023-04-01 MED ORDER — GADOBUTROL 1 MMOL/ML IV SOLN
10.0000 mL | Freq: Once | INTRAVENOUS | Status: AC | PRN
  Administered 2023-04-01: 10 mL via INTRAVENOUS

## 2023-04-01 MED ORDER — THIAMINE HCL 100 MG/ML IJ SOLN: 100.0000 mg | Freq: Every day | INTRAMUSCULAR | Status: DC

## 2023-04-01 MED ORDER — THIAMINE MONONITRATE 100 MG PO TABS
100.0000 mg | ORAL_TABLET | Freq: Every day | ORAL | Status: DC
  Administered 2023-04-01: 100 mg via ORAL
  Filled 2023-04-01: qty 1

## 2023-04-01 MED ORDER — DEXTROSE-NACL 5-0.9 % IV SOLN: INTRAVENOUS | Status: AC

## 2023-04-01 MED ORDER — FOLIC ACID 1 MG PO TABS
1.0000 mg | ORAL_TABLET | Freq: Every day | ORAL | Status: DC
  Administered 2023-04-02 – 2023-04-05 (×4): 1 mg via ORAL
  Filled 2023-04-01 (×4): qty 1

## 2023-04-01 MED ORDER — POTASSIUM CHLORIDE CRYS ER 20 MEQ PO TBCR
40.0000 meq | EXTENDED_RELEASE_TABLET | Freq: Once | ORAL | Status: AC
  Administered 2023-04-01: 40 meq via ORAL
  Filled 2023-04-01: qty 2

## 2023-04-01 MED ORDER — ENOXAPARIN SODIUM 40 MG/0.4ML IJ SOSY
40.0000 mg | PREFILLED_SYRINGE | INTRAMUSCULAR | Status: DC
  Administered 2023-04-01 – 2023-04-05 (×5): 40 mg via SUBCUTANEOUS
  Filled 2023-04-01 (×5): qty 0.4

## 2023-04-01 MED ORDER — THIAMINE MONONITRATE 100 MG PO TABS
100.0000 mg | ORAL_TABLET | Freq: Every day | ORAL | Status: DC
  Administered 2023-04-02 – 2023-04-05 (×4): 100 mg via ORAL
  Filled 2023-04-01 (×4): qty 1

## 2023-04-01 MED ORDER — LABETALOL HCL 5 MG/ML IV SOLN
10.0000 mg | INTRAVENOUS | Status: DC | PRN
  Administered 2023-04-01 – 2023-04-03 (×3): 10 mg via INTRAVENOUS
  Filled 2023-04-01 (×4): qty 4

## 2023-04-01 MED ORDER — AMLODIPINE BESYLATE 5 MG PO TABS
5.0000 mg | ORAL_TABLET | Freq: Every day | ORAL | Status: DC
  Administered 2023-04-01 – 2023-04-03 (×3): 5 mg via ORAL
  Filled 2023-04-01 (×3): qty 1

## 2023-04-01 MED ORDER — ACETAMINOPHEN 650 MG RE SUPP: 650.0000 mg | Freq: Four times a day (QID) | RECTAL | Status: DC | PRN

## 2023-04-01 MED ORDER — LORAZEPAM 1 MG PO TABS: 1.0000 mg | ORAL_TABLET | Freq: Four times a day (QID) | ORAL | Status: DC | PRN

## 2023-04-01 MED ORDER — POTASSIUM CHLORIDE 10 MEQ/100ML IV SOLN
10.0000 meq | INTRAVENOUS | Status: AC
  Administered 2023-04-01 (×2): 10 meq via INTRAVENOUS
  Filled 2023-04-01 (×2): qty 100

## 2023-04-01 MED ORDER — FOLIC ACID 1 MG PO TABS
1.0000 mg | ORAL_TABLET | Freq: Every day | ORAL | Status: DC
  Administered 2023-04-01: 1 mg via ORAL
  Filled 2023-04-01: qty 1

## 2023-04-01 MED ORDER — LORAZEPAM 1 MG PO TABS
1.0000 mg | ORAL_TABLET | Freq: Once | ORAL | Status: AC
  Administered 2023-04-01: 1 mg via ORAL
  Filled 2023-04-01: qty 1

## 2023-04-01 MED ORDER — ADULT MULTIVITAMIN W/MINERALS CH
1.0000 | ORAL_TABLET | Freq: Every day | ORAL | Status: DC
  Administered 2023-04-02 – 2023-04-03 (×2): 1 via ORAL
  Filled 2023-04-01 (×2): qty 1

## 2023-04-01 MED ORDER — ONDANSETRON HCL 4 MG/2ML IJ SOLN: 4.0000 mg | Freq: Four times a day (QID) | INTRAMUSCULAR | Status: DC | PRN

## 2023-04-01 MED ORDER — ACETAMINOPHEN 325 MG PO TABS: 650.0000 mg | ORAL_TABLET | Freq: Four times a day (QID) | ORAL | Status: DC | PRN

## 2023-04-01 MED ORDER — ADULT MULTIVITAMIN W/MINERALS CH
1.0000 | ORAL_TABLET | Freq: Every day | ORAL | Status: DC
  Administered 2023-04-01: 1 via ORAL
  Filled 2023-04-01: qty 1

## 2023-04-01 MED ORDER — ENSURE ENLIVE PO LIQD
237.0000 mL | Freq: Two times a day (BID) | ORAL | Status: DC
  Administered 2023-04-01 – 2023-04-02 (×3): 237 mL via ORAL

## 2023-04-01 MED ORDER — LORAZEPAM 1 MG PO TABS
1.0000 mg | ORAL_TABLET | ORAL | Status: AC | PRN
  Administered 2023-04-01 – 2023-04-02 (×2): 1 mg via ORAL
  Administered 2023-04-03 (×3): 2 mg via ORAL
  Filled 2023-04-01: qty 1
  Filled 2023-04-01 (×2): qty 2
  Filled 2023-04-01 (×2): qty 1

## 2023-04-01 MED ORDER — ONDANSETRON HCL 4 MG PO TABS: 4.0000 mg | ORAL_TABLET | Freq: Four times a day (QID) | ORAL | Status: DC | PRN

## 2023-04-01 MED ORDER — THIAMINE HCL 100 MG/ML IJ SOLN
100.0000 mg | Freq: Once | INTRAMUSCULAR | Status: AC
  Administered 2023-04-01: 100 mg via INTRAVENOUS
  Filled 2023-04-01: qty 2

## 2023-04-01 MED ORDER — PANTOPRAZOLE SODIUM 40 MG PO TBEC
40.0000 mg | DELAYED_RELEASE_TABLET | Freq: Every day | ORAL | Status: DC
  Administered 2023-04-01 – 2023-04-03 (×3): 40 mg via ORAL
  Filled 2023-04-01 (×3): qty 1

## 2023-04-01 NOTE — Progress Notes (Signed)
   04/01/23 0900  Assess: MEWS Score  Temp 98.7 F (37.1 C)  BP (!) 161/83  MAP (mmHg) 105  Pulse Rate (!) 117  Resp 20  Level of Consciousness Alert  SpO2 97 %  O2 Device Room Air  Assess: MEWS Score  MEWS Temp 0  MEWS Systolic 0  MEWS Pulse 2  MEWS RR 0  MEWS LOC 0  MEWS Score 2  MEWS Score Color Yellow  Assess: if the MEWS score is Yellow or Red  Were vital signs taken at a resting state? Yes  Focused Assessment No change from prior assessment  Does the patient meet 2 or more of the SIRS criteria? Yes  Does the patient have a confirmed or suspected source of infection? Yes  Provider and Rapid Response Notified? No (MD aware)  MEWS guidelines implemented  Yes, yellow  Treat  MEWS Interventions Considered administering scheduled or prn medications/treatments as ordered  Take Vital Signs  Increase Vital Sign Frequency  Yellow: Q2hr x1, continue Q4hrs until patient remains green for 12hrs  Escalate  MEWS: Escalate Yellow: Discuss with charge nurse and consider notifying provider and/or RRT  Notify: Charge Nurse/RN  Name of Charge Nurse/RN Notified Alda Berthold  Provider Notification  Provider Name/Title Dr Butler Denmark  Date Provider Notified 04/01/23  Time Provider Notified 0920  Method of Notification Face-to-face  Provider response In department  Date of Provider Response 04/01/23  Time of Provider Response 0920  Notify: Rapid Response  Name of Rapid Response RN Notified  (NA)  Assess: SIRS CRITERIA  SIRS Temperature  0  SIRS Pulse 1  SIRS Respirations  0  SIRS WBC 0  SIRS Score Sum  1

## 2023-04-01 NOTE — Progress Notes (Addendum)
[facility] Progress Note  Patient: Brian [name]     ZOX:[id number]  DOA: 03/31/2023   PCP: Patient, No Pcp Per       Brief hospital course: This is a 62 year old male with hypertension who presents 2 days after a fall in the bathroom.  He continues to have pain in has had trouble ambulating. He is a poor historian.   In the ED, noted to have: AST 180 ALT 53 alkaline phosphatase 124, T. bili 7.5 and a lipase of 368.  Anion gap was 18. A CT scan of the chest abdomen pelvis was performed which revealed - Acute pancreatitis without pseudocyst - Cholelithiasis with "question associated gallbladder wall thickening" - 4.6 cm left thyroid gland nodule with prevascular space mediastinal soft tissue density and enlarged lymph nodes-PET/CT recommended in addition to thyroid ultrasound - Hepatic steatosis and hepatomegaly - Nonobstructing right nephrolithiasis measuring up to 5 mm - Prostatomegaly - Colonic reticulosis - Trace right fat-containing inguinal hernia and small fat containing left inguinal hernia  Right upper quadrant ultrasound revealed gallbladder with gallstones and gallbladder wall thickening of 5 mm, nonspecific, negative Murphy sign, hepatic steatosis  Subjective:  No complaints of abdominal pain-he does admit to some shoulder pain but this does not limit his movement  Assessment and Plan: Principal Problem:   Acute pancreatitis- -Possibly gallstone pancreatitis-he admits to drinking but binge drinking and not daily drinking - Can continue full liquids and D5 normal saline at 100 cc an hour  Active Problems:   Hypokalemia -Potassium 3.1, magnesium 1.8 - Replace and follow    Transaminitis and elevated bilirubin - ?  Acute due to alcohol abuse versus chronic due to fatty liver - Follow  Alcohol abuse - CIWA scale with oral Ativan - Thiamine and folic acid ordered  Ketoacidosis - States his appetite is good -Continue to follow oral intake - Ensure  twice daily ordered    Hypothermia/ SIRS -Temperature initially 34 F with tachycardia and blood pressure of 89/60    Hypoalbuminemia -Albumin 2.7    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 - Thyroid ultrasound> 4.9 cm, located in the inferior left thyroid lobe, meets criteria for FNA - will need to arrange oupt f/u - f/u Thyroid functions     Essential hypertension -He states he does not take any medications at home     Code Status: Full Code Consultants: None Level of Care: Level of care: Med-Surg Total time on patient care: 45 DVT prophylaxis:  enoxaparin (LOVENOX) injection 40 mg Start: 04/01/23 1000 SCDs Start: 04/01/23 0401     Objective:   Vitals:   04/01/23 0558 04/01/23 0900 04/01/23 1100 04/01/23 1502  BP: (!) 155/87 (!) 161/83 (!) 178/94 (!) 172/95  Pulse: 98 (!) 117 (!) 113 (!) 132  Resp: Temp: 98.6 F (37 C) 98.7 F (37.1 C) 99.6 F (37.6 C) 98.9 F (37.2 C)  TempSrc: Oral Oral Oral Oral  SpO2: 100% 97% 97% 100%   There were no vitals filed for this visit. Exam: General exam: Appears comfortable  HEENT: oral mucosa moist Respiratory system: Clear to auscultation.  Cardiovascular system: S1 & S2 heard  Gastrointestinal system: Abdomen soft, non-tender, nondistended. Normal bowel sounds   Extremities: No cyanosis, clubbing or edema Psychiatry:  Mood & affect appropriate.      CBC: Recent Labs  Lab 03/31/23 1633 04/01/23 0458  WBC 5.0 6.2  NEUTROABS 3.9  --   HGB 13.5 13.1  HCT 38.9* 38.0*  MCV 94.6 96.7  PLT 282 253   Basic Metabolic Panel: Recent Labs  Lab 03/31/23 1633 04/01/23 0458 04/01/23 1512  NA 137 137 140  K 3.0* 3.1* 3.1*  CL 100 103 105  CO2 19* 20* 21*  GLUCOSE 83 65* 111*  BUN 8 9 9   CREATININE 0.63 0.56* 0.52*  CALCIUM 7.9* 7.5* 8.0*  MG  --  1.8  --   PHOS  --  4.7*  --    GFR: CrCl cannot be calculated (Unknown ideal weight.).  Scheduled Meds:  amLODipine  5 mg Oral Daily   enoxaparin (LOVENOX)  injection  40 mg Subcutaneous Q24H   feeding supplement  237 mL Oral BID BM   folic acid  1 mg Oral Daily   multivitamin with minerals  1 tablet Oral Daily   pantoprazole  40 mg Oral Daily   thiamine  100 mg Oral Daily   Continuous Infusions:  dextrose 5 % and 0.9% NaCl 100 mL/hr at 04/01/23 1000   Imaging and lab data was personally reviewed MR ABDOMEN MRCP W WO CONTAST  Result Date: 04/01/2023 CLINICAL DATA:  Pancreatitis.  History of gallstones. EXAM: MRI ABDOMEN WITHOUT AND WITH CONTRAST (INCLUDING MRCP) TECHNIQUE: Multiplanar multisequence MR imaging of the abdomen was performed both before and after the administration of intravenous contrast. Heavily T2-weighted images of the biliary and pancreatic ducts were obtained, and three-dimensional MRCP images were rendered by post processing. CONTRAST:  10mL GADAVIST GADOBUTROL 1 MMOL/ML IV SOLN COMPARISON:  CT AP 03/31/2023 FINDINGS: Lower chest: No acute abnormality. Hepatobiliary: Marked hepatic steatosis. Hypertrophy of the caudate lobe and lateral segment of left hepatic lobe identified. Contour the liver is slightly nodular. Heterogeneous enhancement of the liver parenchyma is identified. No focal enhancing liver lesions identified. The gallbladder is filled with multiple large stones. The largest is in the gallbladder neck measuring 1.3 cm. There is mild gallbladder wall thickening and edema measuring up to 5 mm, image 34/5. No intrahepatic or common bile duct dilatation. No signs of choledocholithiasis. Pancreas: Changes of acute pancreatitis are again identified with peripancreatic edema. Fluid is seen extending from the pancreas into the retroperitoneum bilaterally. No fluid collections identified to suggest pseudocyst. At the level of the tail the main duct is upper limits of normal measuring 3 mm. No mass identified. No signs of pancreatic necrosis. Spleen:  Within normal limits in size and appearance. Adrenals/Urinary Tract: No masses  identified. No evidence of hydronephrosis. Stomach/Bowel: Small hiatal hernia. Stomach is nondistended. No bowel wall thickening or distension identified. Vascular/Lymphatic: Normal caliber abdominal aorta. Upper abdominal vascularity including the portal vein is patent. Portacaval lymph node measures 1.5 cm, image 49/5. Porta hepatic lymph node measures 1 cm, image 43/5. Multiple prominent left retroperitoneal and gastrohepatic ligament lymph nodes are also noted. Other: No discrete fluid collections identified. Mild upper abdominal scratch set trace perihepatic and perisplenic fluid. Musculoskeletal: No enhancing bone lesions identified. IMPRESSION: 1. Acute pancreatitis. No evidence for pancreatic necrosis or pseudocyst. 2. Gallstones with mild gallbladder wall thickening and edema. No signs of choledocholithiasis. In the setting of acute pancreatitis gallbladder wall edema. Gallbladder wall edema may also be seen in the setting of cirrhosis 3. No significant bile duct dilatation or signs of choledocholithiasis. 4. Marked hepatic steatosis with morphologic features of the liver concerning for cirrhosis. 5. Small hiatal hernia. 6. Increased size of upper abdominal lymph nodes. Nonspecific in the setting of cirrhosis as well as pancreatitis. Electronically Signed   By: Veronda Prude.D.  On: 04/01/2023 14:14   MR 3D Recon At Scanner  Result Date: 04/01/2023 CLINICAL DATA:  Pancreatitis.  History of gallstones. EXAM: MRI ABDOMEN WITHOUT AND WITH CONTRAST (INCLUDING MRCP) TECHNIQUE: Multiplanar multisequence MR imaging of the abdomen was performed both before and after the administration of intravenous contrast. Heavily T2-weighted images of the biliary and pancreatic ducts were obtained, and three-dimensional MRCP images were rendered by post processing. CONTRAST:  10mL GADAVIST GADOBUTROL 1 MMOL/ML IV SOLN COMPARISON:  CT AP 03/31/2023 FINDINGS: Lower chest: No acute abnormality. Hepatobiliary: Marked  hepatic steatosis. Hypertrophy of the caudate lobe and lateral segment of left hepatic lobe identified. Contour the liver is slightly nodular. Heterogeneous enhancement of the liver parenchyma is identified. No focal enhancing liver lesions identified. The gallbladder is filled with multiple large stones. The largest is in the gallbladder neck measuring 1.3 cm. There is mild gallbladder wall thickening and edema measuring up to 5 mm, image 34/5. No intrahepatic or common bile duct dilatation. No signs of choledocholithiasis. Pancreas: Changes of acute pancreatitis are again identified with peripancreatic edema. Fluid is seen extending from the pancreas into the retroperitoneum bilaterally. No fluid collections identified to suggest pseudocyst. At the level of the tail the main duct is upper limits of normal measuring 3 mm. No mass identified. No signs of pancreatic necrosis. Spleen:  Within normal limits in size and appearance. Adrenals/Urinary Tract: No masses identified. No evidence of hydronephrosis. Stomach/Bowel: Small hiatal hernia. Stomach is nondistended. No bowel wall thickening or distension identified. Vascular/Lymphatic: Normal caliber abdominal aorta. Upper abdominal vascularity including the portal vein is patent. Portacaval lymph node measures 1.5 cm, image 49/5. Porta hepatic lymph node measures 1 cm, image 43/5. Multiple prominent left retroperitoneal and gastrohepatic ligament lymph nodes are also noted. Other: No discrete fluid collections identified. Mild upper abdominal scratch set trace perihepatic and perisplenic fluid. Musculoskeletal: No enhancing bone lesions identified. IMPRESSION: 1. Acute pancreatitis. No evidence for pancreatic necrosis or pseudocyst. 2. Gallstones with mild gallbladder wall thickening and edema. No signs of choledocholithiasis. In the setting of acute pancreatitis gallbladder wall edema. Gallbladder wall edema may also be seen in the setting of cirrhosis 3. No  significant bile duct dilatation or signs of choledocholithiasis. 4. Marked hepatic steatosis with morphologic features of the liver concerning for cirrhosis. 5. Small hiatal hernia. 6. Increased size of upper abdominal lymph nodes. Nonspecific in the setting of cirrhosis as well as pancreatitis. Electronically Signed   By: Signa Kell M.D.   On: 04/01/2023 14:14   US THYROID  Result Date: 04/01/2023 CLINICAL DATA:  Thyroid nodule seen on CT angiography of the chest EXAM: THYROID ULTRASOUND TECHNIQUE: Ultrasound examination of the thyroid gland and adjacent soft tissues was performed. COMPARISON:  CT angiography chest 03/31/2023 FINDINGS: Parenchymal Echotexture: Mildly heterogenous Isthmus: 0.3 cm Right lobe: 3.0 x 1.6 x 1.3 cm Left lobe: 7.5 x 3.6 x 4.6 cm _________________________________________________________ Estimated total number of nodules >/= 1 cm: 1 Number of spongiform nodules >/=  2 cm not described below (TR1): 0 Number of mixed cystic and solid nodules >/= 1.5 cm not described below (TR2): 0 _________________________________________________________ Nodule # 1: Location: Left; inferior Maximum size: 4.9 cm; Other 2 dimensions: 4.6 x 4.7 cm Composition: solid/almost completely solid (2) Echogenicity: isoechoic (1) Shape: not taller-than-wide (0) Margins: smooth (0) Echogenic foci: none (0) ACR TI-RADS total points: 3. ACR TI-RADS risk category: TR3 (3 points). ACR TI-RADS recommendations: **Given size (>/= 2.5 cm) and appearance, fine needle aspiration of this mildly suspicious nodule should  be considered based on TI-RADS criteria. This nodule corresponds to the abnormality seen on chest CT _________________________________________________________ IMPRESSION: Nodule 1 (TI-RADS 3), measuring 4.9 cm, located in the inferior left thyroid lobe, meets criteria for FNA. The above is in keeping with the ACR TI-RADS recommendations - J Am Coll Radiol 2017;14:587-595. Electronically Signed   By: Acquanetta Belling M.D.   On: 04/01/2023 06:08   US Abdomen Limited RUQ (LIVER/GB)  Result Date: 03/31/2023 CLINICAL DATA:  Epigastric pain. EXAM: ULTRASOUND ABDOMEN LIMITED RIGHT UPPER QUADRANT COMPARISON:  CT earlier today FINDINGS: Gallbladder: Multiple shadowing gallstones obscuring the posterior gallbladder wall. Likely gallbladder sludge. There is mild gallbladder wall thickening at 4-5 mm. No pericholecystic fluid. No sonographic Murphy sign noted by sonographer. Common bile duct: Diameter: 4-5 mm, nondilated. Liver: Heterogeneous and diffusely increased in parenchymal echogenicity. The liver parenchyma is difficult to penetrate. No evidence of focal lesion. No convincing capsular nodularity. Portal vein is patent on color Doppler imaging with normal direction of blood flow towards the liver. Other: No right upper quadrant ascites. IMPRESSION: 1. Gallbladder filled with gallstones. Gallbladder wall thickening of 4-5 mm, nonspecific. Negative sonographic Murphy sign. 2. No biliary dilatation. 3. Hepatic steatosis. Electronically Signed   By: Narda Rutherford M.D.   On: 03/31/2023 21:59   CT L-SPINE NO CHARGE  Result Date: 03/31/2023 CLINICAL DATA:  Polytrauma, blunt.  Fall.  Diffuse abdominal pain. EXAM: CT CERVICAL, THORACIC, AND LUMBAR SPINE WITHOUT CONTRAST TECHNIQUE: Multidetector CT imaging of the cervical, thoracic and lumbar spine was performed without intravenous contrast. Multiplanar CT image reconstructions were also generated. RADIATION DOSE REDUCTION: This exam was performed according to the departmental dose-optimization program which includes automated exposure control, adjustment of the mA and/or kV according to patient size and/or use of iterative reconstruction technique. COMPARISON:  None Available. FINDINGS: CT CERVICAL SPINE FINDINGS Alignment: Normal. Skull base and vertebrae: Multilevel moderate degenerative changes spine. No associated severe osseous neural foraminal central canal stenosis. No  acute fracture. No aggressive appearing focal osseous lesion or focal pathologic process. Soft tissues and spinal canal: No prevertebral fluid or swelling. No visible canal hematoma. Upper chest: Unremarkable. Other: Left thyroid gland nodule-please see separately dictated CT chest 03/31/2023. CT THORACIC SPINE FINDINGS Alignment: Normal. Vertebrae: Severe contiguous osteophyte formation. Multilevel mild facet arthropathy. Posterior longitudinal ligament calcification. No acute fracture or focal pathologic process. Paraspinal and other soft tissues: Negative. Disc levels: Maintained. CT LUMBAR SPINE FINDINGS Segmentation: 5 lumbar type vertebrae. Alignment: Normal. Vertebrae: Multilevel moderate degenerative changes spine with posterior disc osteophyte complex formation at the L2-L3, L3-L4, L4-L5, L5-S1 levels. No associated severe osseous neural foraminal or central canal stenosis. No acute fracture or focal pathologic process. Paraspinal and other soft tissues: Negative. Disc levels: Maintained. Other: Moderate degenerative changes of the right hip. Atherosclerotic plaque. IMPRESSION: 1. No acute displaced fracture or traumatic listhesis of the cervical, thoracic, lumbar spine. 2. Thoracic findings suggestive of DISH. 3. Left thyroid gland nodule-please see separately dictated CT chest 03/31/2023. 4.  Aortic Atherosclerosis (ICD10-I70.0). Electronically Signed   By: Tish Frederickson M.D.   On: 03/31/2023 21:20   CT T-SPINE NO CHARGE  Result Date: 03/31/2023 CLINICAL DATA:  Polytrauma, blunt.  Fall.  Diffuse abdominal pain. EXAM: CT CERVICAL, THORACIC, AND LUMBAR SPINE WITHOUT CONTRAST TECHNIQUE: Multidetector CT imaging of the cervical, thoracic and lumbar spine was performed without intravenous contrast. Multiplanar CT image reconstructions were also generated. RADIATION DOSE REDUCTION: This exam was performed according to the departmental dose-optimization program which includes automated exposure control,  adjustment of the mA and/or kV according to patient size and/or use of iterative reconstruction technique. COMPARISON:  None Available. FINDINGS: CT CERVICAL SPINE FINDINGS Alignment: Normal. Skull base and vertebrae: Multilevel moderate degenerative changes spine. No associated severe osseous neural foraminal central canal stenosis. No acute fracture. No aggressive appearing focal osseous lesion or focal pathologic process. Soft tissues and spinal canal: No prevertebral fluid or swelling. No visible canal hematoma. Upper chest: Unremarkable. Other: Left thyroid gland nodule-please see separately dictated CT chest 03/31/2023. CT THORACIC SPINE FINDINGS Alignment: Normal. Vertebrae: Severe contiguous osteophyte formation. Multilevel mild facet arthropathy. Posterior longitudinal ligament calcification. No acute fracture or focal pathologic process. Paraspinal and other soft tissues: Negative. Disc levels: Maintained. CT LUMBAR SPINE FINDINGS Segmentation: 5 lumbar type vertebrae. Alignment: Normal. Vertebrae: Multilevel moderate degenerative changes spine with posterior disc osteophyte complex formation at the L2-L3, L3-L4, L4-L5, L5-S1 levels. No associated severe osseous neural foraminal or central canal stenosis. No acute fracture or focal pathologic process. Paraspinal and other soft tissues: Negative. Disc levels: Maintained. Other: Moderate degenerative changes of the right hip. Atherosclerotic plaque. IMPRESSION: 1. No acute displaced fracture or traumatic listhesis of the cervical, thoracic, lumbar spine. 2. Thoracic findings suggestive of DISH. 3. Left thyroid gland nodule-please see separately dictated CT chest 03/31/2023. 4.  Aortic Atherosclerosis (ICD10-I70.0). Electronically Signed   By: Tish Frederickson M.D.   On: 03/31/2023 21:20   CT Cervical Spine Wo Contrast  Result Date: 03/31/2023 CLINICAL DATA:  Polytrauma, blunt.  Fall.  Diffuse abdominal pain. EXAM: CT CERVICAL, THORACIC, AND LUMBAR SPINE  WITHOUT CONTRAST TECHNIQUE: Multidetector CT imaging of the cervical, thoracic and lumbar spine was performed without intravenous contrast. Multiplanar CT image reconstructions were also generated. RADIATION DOSE REDUCTION: This exam was performed according to the departmental dose-optimization program which includes automated exposure control, adjustment of the mA and/or kV according to patient size and/or use of iterative reconstruction technique. COMPARISON:  None Available. FINDINGS: CT CERVICAL SPINE FINDINGS Alignment: Normal. Skull base and vertebrae: Multilevel moderate degenerative changes spine. No associated severe osseous neural foraminal central canal stenosis. No acute fracture. No aggressive appearing focal osseous lesion or focal pathologic process. Soft tissues and spinal canal: No prevertebral fluid or swelling. No visible canal hematoma. Upper chest: Unremarkable. Other: Left thyroid gland nodule-please see separately dictated CT chest 03/31/2023. CT THORACIC SPINE FINDINGS Alignment: Normal. Vertebrae: Severe contiguous osteophyte formation. Multilevel mild facet arthropathy. Posterior longitudinal ligament calcification. No acute fracture or focal pathologic process. Paraspinal and other soft tissues: Negative. Disc levels: Maintained. CT LUMBAR SPINE FINDINGS Segmentation: 5 lumbar type vertebrae. Alignment: Normal. Vertebrae: Multilevel moderate degenerative changes spine with posterior disc osteophyte complex formation at the L2-L3, L3-L4, L4-L5, L5-S1 levels. No associated severe osseous neural foraminal or central canal stenosis. No acute fracture or focal pathologic process. Paraspinal and other soft tissues: Negative. Disc levels: Maintained. Other: Moderate degenerative changes of the right hip. Atherosclerotic plaque. IMPRESSION: 1. No acute displaced fracture or traumatic listhesis of the cervical, thoracic, lumbar spine. 2. Thoracic findings suggestive of DISH. 3. Left thyroid gland  nodule-please see separately dictated CT chest 03/31/2023. 4.  Aortic Atherosclerosis (ICD10-I70.0). Electronically Signed   By: Tish Frederickson M.D.   On: 03/31/2023 21:20   CT Angio Chest PE W/Cm &/Or Wo Cm  Result Date: 03/31/2023 CLINICAL DATA:  Pulmonary embolism (PE) suspected, low to intermediate prob, positive D-dimer; Abdominal pain, acute, nonlocalized elevated bili, hypotensive, fall 2 days ago EXAM: CT ANGIOGRAPHY CHEST CT ABDOMEN AND PELVIS WITH CONTRAST TECHNIQUE: Multidetector  CT imaging of the chest was performed using the standard protocol during bolus administration of intravenous contrast. Multiplanar CT image reconstructions and MIPs were obtained to evaluate the vascular anatomy. Multidetector CT imaging of the abdomen and pelvis was performed using the standard protocol during bolus administration of intravenous contrast. RADIATION DOSE REDUCTION: This exam was performed according to the departmental dose-optimization program which includes automated exposure control, adjustment of the mA and/or kV according to patient size and/or use of iterative reconstruction technique. CONTRAST:  OMNIPAQUE IOHEXOL 350 MG/ML SOLN COMPARISON:  None Available. FINDINGS: CTA CHEST FINDINGS Cardiovascular: Satisfactory opacification of the pulmonary arteries to the segmental level. No evidence of pulmonary embolism. Normal heart size. No significant pericardial effusion. The thoracic aorta is normal in caliber. Mild atherosclerotic plaque of the thoracic aorta. No coronary artery calcifications. Mediastinum/Nodes: Fat stranding and soft tissue density with enlarged lymph nodes along the low pre-vascular space (5: 66-106). Enlarged prevascular lymph node measuring 1.6 cm. No enlarged hilar or axillary lymph nodes. Trachea and esophagus esophagus demonstrate no significant findings. Tiny hiatal hernia. Enlarged left thyroid gland with a heterogeneous hypodense 4.2 x 4.6 cm thyroid gland nodule.  Lungs/Pleura: Low lung volumes. Bilateral lower lobe atelectasis. No focal consolidation. No pulmonary nodule. No pulmonary mass. No pleural effusion. No pneumothorax. Musculoskeletal: No chest wall abnormality. No suspicious lytic or blastic osseous lesions. No acute displaced fracture. Please see separately dictated CT thoracic spine 03/31/2023. Review of the MIP images confirms the above findings. CT ABDOMEN and PELVIS FINDINGS Hepatobiliary: The liver is enlarged measuring up to 21.5 cm. The hepatic parenchyma is diffusely hypodense compared to the splenic parenchyma consistent with fatty infiltration. No focal liver abnormality. Calcified gallstones within the gallbladder lumen. Question gallbladder wall thickening. No definite pericholecystic fluid. No biliary dilatation. Pancreas: Peripancreatic fat stranding. Slight haziness of the pancreatic contour proximally. The pancreas appears slightly diffusely atrophic. No main pancreatic ductal dilatation. Spleen: Normal in size without focal abnormality. Adrenals/Urinary Tract: No adrenal nodule bilaterally. Bilateral kidneys enhance symmetrically. No hydronephrosis. No hydroureter. Right nephrolithiasis measuring up to 5 mm. No left nephrolithiasis. No ureterolithiasis bilaterally. The urinary bladder is unremarkable. On delayed imaging, there is no urothelial wall thickening and there are no filling defects in the opacified portions of the bilateral collecting systems or ureters. Stomach/Bowel: Stomach is within normal limits. No evidence of bowel wall thickening or dilatation. Colonic diverticulosis. Appendix appears normal. Vascular/Lymphatic: No abdominal aorta or iliac aneurysm. Moderate atherosclerotic plaque of the aorta and its branches. No abdominal, pelvic, or inguinal lymphadenopathy. Reproductive: The prostate is enlarged measuring up to 5 cm. Other: No intraperitoneal free fluid. No intraperitoneal free gas. No organized fluid collection.  Musculoskeletal: Trace right fat containing inguinal hernia. Small fat containing left inguinal hernia. No suspicious lytic or blastic osseous lesions. No acute displaced fracture. Please see separately dictated CT lumbar spine 03/31/2023. Review of the MIP images confirms the above findings. IMPRESSION: 1. No pulmonary embolus. 2. A 4.6 cm left thyroid gland nodule with prevascular space mediastinal soft tissue density and enlarged lymph nodes. Recommend thyroid US (ref: J Am Coll Radiol. 2015 Feb;12(2): 143-50). Given mediastinal findings, consider PET-CT following thyroid ultrasound. 3. Acute pancreatitis.  No pseudocyst formation. 4. Cholelithiasis with question associated gallbladder wall thickening. Consider right upper quadrant ultrasound for further evaluation. 5. Bilateral lower lobe atelectasis. Superimposed developing infection not fully excluded. Followup PA and lateral chest X-ray is recommended in 3-4 weeks following therapy to ensure resolution. 6. Other imaging findings of potential clinical significance:  Tiny hiatal hernia. Hepatic steatosis and hepatomegaly. Nonobstructive right nephrolithiasis measuring up to 5 mm. Colonic diverticulosis with no acute diverticulitis. Prostatomegaly. Trace right fat containing inguinal hernia. Small fat containing left inguinal hernia. Aortic Atherosclerosis (ICD10-I70.0). 7. No acute intrathoracic, intra-abdominal, intrapelvic traumatic injury. 8. Please see separately dictated CT thoracolumbar spine 03/31/2023. Electronically Signed   By: Tish Frederickson M.D.   On: 03/31/2023 20:53   CT Abdomen Pelvis W Contrast  Result Date: 03/31/2023 CLINICAL DATA:  Pulmonary embolism (PE) suspected, low to intermediate prob, positive D-dimer; Abdominal pain, acute, nonlocalized elevated bili, hypotensive, fall 2 days ago EXAM: CT ANGIOGRAPHY CHEST CT ABDOMEN AND PELVIS WITH CONTRAST TECHNIQUE: Multidetector CT imaging of the chest was performed using the standard protocol  during bolus administration of intravenous contrast. Multiplanar CT image reconstructions and MIPs were obtained to evaluate the vascular anatomy. Multidetector CT imaging of the abdomen and pelvis was performed using the standard protocol during bolus administration of intravenous contrast. RADIATION DOSE REDUCTION: This exam was performed according to the departmental dose-optimization program which includes automated exposure control, adjustment of the mA and/or kV according to patient size and/or use of iterative reconstruction technique. CONTRAST:  OMNIPAQUE IOHEXOL 350 MG/ML SOLN COMPARISON:  None Available. FINDINGS: CTA CHEST FINDINGS Cardiovascular: Satisfactory opacification of the pulmonary arteries to the segmental level. No evidence of pulmonary embolism. Normal heart size. No significant pericardial effusion. The thoracic aorta is normal in caliber. Mild atherosclerotic plaque of the thoracic aorta. No coronary artery calcifications. Mediastinum/Nodes: Fat stranding and soft tissue density with enlarged lymph nodes along the low pre-vascular space (5: 66-106). Enlarged prevascular lymph node measuring 1.6 cm. No enlarged hilar or axillary lymph nodes. Trachea and esophagus esophagus demonstrate no significant findings. Tiny hiatal hernia. Enlarged left thyroid gland with a heterogeneous hypodense 4.2 x 4.6 cm thyroid gland nodule. Lungs/Pleura: Low lung volumes. Bilateral lower lobe atelectasis. No focal consolidation. No pulmonary nodule. No pulmonary mass. No pleural effusion. No pneumothorax. Musculoskeletal: No chest wall abnormality. No suspicious lytic or blastic osseous lesions. No acute displaced fracture. Please see separately dictated CT thoracic spine 03/31/2023. Review of the MIP images confirms the above findings. CT ABDOMEN and PELVIS FINDINGS Hepatobiliary: The liver is enlarged measuring up to 21.5 cm. The hepatic parenchyma is diffusely hypodense compared to the splenic  parenchyma consistent with fatty infiltration. No focal liver abnormality. Calcified gallstones within the gallbladder lumen. Question gallbladder wall thickening. No definite pericholecystic fluid. No biliary dilatation. Pancreas: Peripancreatic fat stranding. Slight haziness of the pancreatic contour proximally. The pancreas appears slightly diffusely atrophic. No main pancreatic ductal dilatation. Spleen: Normal in size without focal abnormality. Adrenals/Urinary Tract: No adrenal nodule bilaterally. Bilateral kidneys enhance symmetrically. No hydronephrosis. No hydroureter. Right nephrolithiasis measuring up to 5 mm. No left nephrolithiasis. No ureterolithiasis bilaterally. The urinary bladder is unremarkable. On delayed imaging, there is no urothelial wall thickening and there are no filling defects in the opacified portions of the bilateral collecting systems or ureters. Stomach/Bowel: Stomach is within normal limits. No evidence of bowel wall thickening or dilatation. Colonic diverticulosis. Appendix appears normal. Vascular/Lymphatic: No abdominal aorta or iliac aneurysm. Moderate atherosclerotic plaque of the aorta and its branches. No abdominal, pelvic, or inguinal lymphadenopathy. Reproductive: The prostate is enlarged measuring up to 5 cm. Other: No intraperitoneal free fluid. No intraperitoneal free gas. No organized fluid collection. Musculoskeletal: Trace right fat containing inguinal hernia. Small fat containing left inguinal hernia. No suspicious lytic or blastic osseous lesions. No acute displaced fracture. Please see separately  dictated CT lumbar spine 03/31/2023. Review of the MIP images confirms the above findings. IMPRESSION: 1. No pulmonary embolus. 2. A 4.6 cm left thyroid gland nodule with prevascular space mediastinal soft tissue density and enlarged lymph nodes. Recommend thyroid US (ref: J Am Coll Radiol. 2015 Feb;12(2): 143-50). Given mediastinal findings, consider PET-CT following  thyroid ultrasound. 3. Acute pancreatitis.  No pseudocyst formation. 4. Cholelithiasis with question associated gallbladder wall thickening. Consider right upper quadrant ultrasound for further evaluation. 5. Bilateral lower lobe atelectasis. Superimposed developing infection not fully excluded. Followup PA and lateral chest X-ray is recommended in 3-4 weeks following therapy to ensure resolution. 6. Other imaging findings of potential clinical significance: Tiny hiatal hernia. Hepatic steatosis and hepatomegaly. Nonobstructive right nephrolithiasis measuring up to 5 mm. Colonic diverticulosis with no acute diverticulitis. Prostatomegaly. Trace right fat containing inguinal hernia. Small fat containing left inguinal hernia. Aortic Atherosclerosis (ICD10-I70.0). 7. No acute intrathoracic, intra-abdominal, intrapelvic traumatic injury. 8. Please see separately dictated CT thoracolumbar spine 03/31/2023. Electronically Signed   By: Tish Frederickson M.D.   On: 03/31/2023 20:53   DG Chest Port 1 View  Result Date: 03/31/2023 CLINICAL DATA:  fall EXAM: PORTABLE CHEST 1 VIEW COMPARISON:  Chest x-ray 01/30/2018 FINDINGS: The heart and mediastinal contours are unchanged. Bibasilar, right greater than left, patchy and streaky airspace opacities. No pulmonary edema. No pleural effusion. No pneumothorax. No acute osseous abnormality. IMPRESSION: Bibasilar, right greater than left, patchy and streaky airspace opacities. Finding likely combination of atelectasis versus infection/inflammation. Followup PA and lateral chest X-ray is recommended in 3-4 weeks following therapy to ensure resolution and exclude underlying malignancy. Electronically Signed   By: Tish Frederickson M.D.   On: 03/31/2023 17:19   DG Pelvis Portable  Result Date: 03/31/2023 CLINICAL DATA:  Trauma EXAM: PORTABLE PELVIS 1 VIEWS COMPARISON:  None Available. FINDINGS: Limited examination consisting of a single view of both hips demonstrates bilateral hip  degenerative changes. This examination can not exclude fractures. No obvious displaced fractures are seen. Pelvic ring appears to be intact. IMPRESSION: Bilateral hip degenerative changes. Evaluation for fractures was limited by lack of additional views. Electronically Signed   By: Layla Maw M.D.   On: 03/31/2023 17:19    LOS: 1 day   Author: Calvert Cantor  04/01/2023 4:20 PM  To contact [facility]>   Check the care team in [facility] and look for the attending/consulting [facility] provider listed  Log into [email or web address] and use [place]'s universal password   Go to> "[facility]"  and find provider  If you still have difficulty reaching the provider, please page the [facility] (Director on Call) for the Hospitalists listed on amion

## 2023-04-01 NOTE — Progress Notes (Signed)
Went to see patient today.    He is in MRI.  I will revisit tomorrow.

## 2023-04-02 LAB — CBC
HCT: 34.1 % — ABNORMAL LOW (ref 39.0–52.0)
Hemoglobin: 11.5 g/dL — ABNORMAL LOW (ref 13.0–17.0)
MCH: 32.5 pg (ref 26.0–34.0)
MCHC: 33.7 g/dL (ref 30.0–36.0)
MCV: 96.3 fL (ref 80.0–100.0)
Platelets: 161 10*3/uL (ref 150–400)
RBC: 3.54 MIL/uL — ABNORMAL LOW (ref 4.22–5.81)
RDW: 14.2 % (ref 11.5–15.5)
WBC: 3.9 10*3/uL — ABNORMAL LOW (ref 4.0–10.5)
nRBC: 0 % (ref 0.0–0.2)

## 2023-04-02 LAB — BASIC METABOLIC PANEL
Anion gap: 9 (ref 5–15)
BUN: 5 mg/dL — ABNORMAL LOW (ref 6–20)
CO2: 25 mmol/L (ref 22–32)
Calcium: 8.4 mg/dL — ABNORMAL LOW (ref 8.9–10.3)
Chloride: 101 mmol/L (ref 98–111)
Creatinine, Ser: 0.42 mg/dL — ABNORMAL LOW (ref 0.61–1.24)
GFR, Estimated: >60 mL/min (ref >60)
Glucose, Bld: 154 mg/dL — ABNORMAL HIGH (ref 70–99)
Potassium: 3.4 mmol/L — ABNORMAL LOW (ref 3.5–5.1)
Sodium: 135 mmol/L (ref 135–145)

## 2023-04-02 LAB — COMPREHENSIVE METABOLIC PANEL
ALT: 47 U/L — ABNORMAL HIGH (ref 0–44)
AST: 155 U/L — ABNORMAL HIGH (ref 15–41)
Albumin: 2.5 g/dL — ABNORMAL LOW (ref 3.5–5.0)
Alkaline Phosphatase: 111 U/L (ref 38–126)
Anion gap: 10 (ref 5–15)
BUN: 6 mg/dL (ref 6–20)
CO2: 25 mmol/L (ref 22–32)
Calcium: 8 mg/dL — ABNORMAL LOW (ref 8.9–10.3)
Chloride: 102 mmol/L (ref 98–111)
Creatinine, Ser: 0.44 mg/dL — ABNORMAL LOW (ref 0.61–1.24)
GFR, Estimated: >60 mL/min (ref >60)
Glucose, Bld: 132 mg/dL — ABNORMAL HIGH (ref 70–99)
Potassium: 2.8 mmol/L — ABNORMAL LOW (ref 3.5–5.1)
Sodium: 137 mmol/L (ref 135–145)
Total Bilirubin: 7.7 mg/dL — ABNORMAL HIGH (ref 0.3–1.2)
Total Protein: 7.1 g/dL (ref 6.5–8.1)

## 2023-04-02 LAB — TSH: TSH: 2.355 u[IU]/mL (ref 0.350–4.500)

## 2023-04-02 LAB — PHOSPHORUS: Phosphorus: 1.4 mg/dL — ABNORMAL LOW (ref 2.5–4.6)

## 2023-04-02 LAB — PROTIME-INR
INR: 1.4 — ABNORMAL HIGH (ref 0.8–1.2)
Prothrombin Time: 16.6 seconds — ABNORMAL HIGH (ref 11.4–15.2)

## 2023-04-02 LAB — MAGNESIUM: Magnesium: 1.7 mg/dL (ref 1.7–2.4)

## 2023-04-02 LAB — LIPASE, BLOOD: Lipase: 207 U/L — ABNORMAL HIGH (ref 11–51)

## 2023-04-02 LAB — T4, FREE: Free T4: 0.95 ng/dL (ref 0.61–1.12)

## 2023-04-02 MED ORDER — POTASSIUM CHLORIDE IN NACL 40-0.9 MEQ/L-% IV SOLN
INTRAVENOUS | Status: DC
  Filled 2023-04-02 (×9): qty 1000

## 2023-04-02 MED ORDER — SIMETHICONE 80 MG PO CHEW
80.0000 mg | CHEWABLE_TABLET | Freq: Four times a day (QID) | ORAL | Status: DC | PRN
  Administered 2023-04-02: 80 mg via ORAL
  Filled 2023-04-02: qty 1

## 2023-04-02 MED ORDER — SODIUM PHOSPHATES 45 MMOLE/15ML IV SOLN
30.0000 mmol | Freq: Once | INTRAVENOUS | Status: AC
  Administered 2023-04-02: 30 mmol via INTRAVENOUS
  Filled 2023-04-02: qty 10

## 2023-04-02 MED ORDER — POTASSIUM CHLORIDE CRYS ER 20 MEQ PO TBCR
40.0000 meq | EXTENDED_RELEASE_TABLET | ORAL | Status: AC
  Administered 2023-04-02 (×2): 40 meq via ORAL
  Filled 2023-04-02 (×2): qty 2

## 2023-04-02 NOTE — Progress Notes (Signed)
[facility] Progress Note  Patient: Brian [name]     KGM:[id number]  DOA: 03/31/2023   PCP: Patient, No Pcp Per       Brief hospital course: This is a 62 year old male with hypertension who presents 2 days after a fall in the bathroom.  He continues to have pain in has had trouble ambulating. He is a poor historian.   In the ED, noted to have: AST 180 ALT 53 alkaline phosphatase 124, T. bili 7.5 and a lipase of 368.  Anion gap was 18. A CT scan of the chest abdomen pelvis was performed which revealed - Acute pancreatitis without pseudocyst - Cholelithiasis with "question associated gallbladder wall thickening" - 4.6 cm left thyroid gland nodule with prevascular space mediastinal soft tissue density and enlarged lymph nodes-PET/CT recommended in addition to thyroid ultrasound - Hepatic steatosis and hepatomegaly - Nonobstructing right nephrolithiasis measuring up to 5 mm - Prostatomegaly - Colonic reticulosis - Trace right fat-containing inguinal hernia and small fat containing left inguinal hernia  Right upper quadrant ultrasound revealed gallbladder with gallstones and gallbladder wall thickening of 5 mm, nonspecific, negative Murphy sign, hepatic steatosis  Subjective:  Has no complaints today.  Assessment and Plan: Principal Problem:   Acute pancreatitis- - had some back pain but no abdominal pain -Possibly gallstone pancreatitis-he admits to drinking but binge drinking and not daily drinking - advance diet to solids  Active Problems:   Hypokalemia -Potassium 3.1, magnesium 1.8 - Replacing      Transaminitis and elevated bilirubin  - ?  Acute due to alcohol abuse versus chronic due to fatty liver - only slightly improved   elevated INR INR 1.4 - likely related to ETOH- give Vit K  Alcohol abuse - CIWA scale with oral Ativan - Thiamine and folic acid ordered  Ketoacidosis - States his appetite is good -Continue to follow oral intake - Ensure twice  daily ordered    Hypothermia/ SIRS Fever today -Temperature initially 28 F with tachycardia and blood pressure of 89/60 - TSH and Free T 4 normal - temp 100.2    Hypoalbuminemia -Albumin 2.7    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 - Thyroid ultrasound> 4.9 cm, located in the inferior left thyroid lobe, meets criteria for FNA - will need to arrange oupt f/u - Thyroid functions - TSH and Free T 4 normal    Essential hypertension -He states he does not take any medications at home     Code Status: Full Code Consultants: None Level of Care: Level of care: Med-Surg Total time on patient care: 45 DVT prophylaxis:  enoxaparin (LOVENOX) injection 40 mg Start: 04/01/23 1000 SCDs Start: 04/01/23 0401     Objective:   Vitals:   04/02/23 0158 04/02/23 0537 04/02/23 0912 04/02/23 1603  BP: (!) 151/103 (!) 155/79 (!) 138/93 (!) 144/114  Pulse: (!) 117 (!) 112 (!) 109 66  Resp: 16 16 20 20   Temp: 98.8 F (37.1 C) 98.5 F (36.9 C) 100.2 F (37.9 C) 99.2 F (37.3 C)  TempSrc: Oral Oral Oral Oral  SpO2: 93% 95% 95% 97%   There were no vitals filed for this visit. Exam: General exam: Appears comfortable  HEENT: oral mucosa moist Respiratory system: Clear to auscultation.  Cardiovascular system: S1 & S2 heard  Gastrointestinal system: Abdomen soft, non-tender, nondistended. Normal bowel sounds   Extremities: No cyanosis, clubbing or edema Psychiatry:  Mood & affect appropriate.      CBC: Recent Labs  Lab 03/31/23 1633  04/01/23 0458 04/02/23 0526  WBC 5.0 6.2 3.9*  NEUTROABS 3.9  --   --   HGB 13.5 13.1 11.5*  HCT 38.9* 38.0* 34.1*  MCV 94.6 96.7 96.3  PLT 282 253 161    Basic Metabolic Panel: Recent Labs  Lab 03/31/23 1633 04/01/23 0458 04/01/23 1512 04/02/23 0526 04/02/23 1517  NA 137 137 140 137 135  K 3.0* 3.1* 3.1* 2.8* 3.4*  CL 100 103 105 102 101  CO2 19* 20* 21* 25 25  GLUCOSE 83 65* 111* 132* 154*  BUN 8 9 9 6  5*  CREATININE 0.63 0.56* 0.52*  0.44* 0.42*  CALCIUM 7.9* 7.5* 8.0* 8.0* 8.4*  MG  --  1.8  --  1.7  --   PHOS  --  4.7*  --  1.4*  --     GFR: CrCl cannot be calculated (Unknown ideal weight.).  Scheduled Meds:  amLODipine  5 mg Oral Daily   enoxaparin (LOVENOX) injection  40 mg Subcutaneous Q24H   feeding supplement  237 mL Oral BID BM   folic acid  1 mg Oral Daily   multivitamin with minerals  1 tablet Oral Daily   pantoprazole  40 mg Oral Daily   thiamine  100 mg Oral Daily   Or   thiamine  100 mg Intravenous Daily   Continuous Infusions:  0.9 % NaCl with KCl 40 mEq / L 100 mL/hr at 04/02/23 0943   Imaging and lab data was personally reviewed MR ABDOMEN MRCP W WO CONTAST  Result Date: 04/01/2023 CLINICAL DATA:  Pancreatitis.  History of gallstones. EXAM: MRI ABDOMEN WITHOUT AND WITH CONTRAST (INCLUDING MRCP) TECHNIQUE: Multiplanar multisequence MR imaging of the abdomen was performed both before and after the administration of intravenous contrast. Heavily T2-weighted images of the biliary and pancreatic ducts were obtained, and three-dimensional MRCP images were rendered by post processing. CONTRAST:  10mL GADAVIST GADOBUTROL 1 MMOL/ML IV SOLN COMPARISON:  CT AP 03/31/2023 FINDINGS: Lower chest: No acute abnormality. Hepatobiliary: Marked hepatic steatosis. Hypertrophy of the caudate lobe and lateral segment of left hepatic lobe identified. Contour the liver is slightly nodular. Heterogeneous enhancement of the liver parenchyma is identified. No focal enhancing liver lesions identified. The gallbladder is filled with multiple large stones. The largest is in the gallbladder neck measuring 1.3 cm. There is mild gallbladder wall thickening and edema measuring up to 5 mm, image 34/5. No intrahepatic or common bile duct dilatation. No signs of choledocholithiasis. Pancreas: Changes of acute pancreatitis are again identified with peripancreatic edema. Fluid is seen extending from the pancreas into the retroperitoneum  bilaterally. No fluid collections identified to suggest pseudocyst. At the level of the tail the main duct is upper limits of normal measuring 3 mm. No mass identified. No signs of pancreatic necrosis. Spleen:  Within normal limits in size and appearance. Adrenals/Urinary Tract: No masses identified. No evidence of hydronephrosis. Stomach/Bowel: Small hiatal hernia. Stomach is nondistended. No bowel wall thickening or distension identified. Vascular/Lymphatic: Normal caliber abdominal aorta. Upper abdominal vascularity including the portal vein is patent. Portacaval lymph node measures 1.5 cm, image 49/5. Porta hepatic lymph node measures 1 cm, image 43/5. Multiple prominent left retroperitoneal and gastrohepatic ligament lymph nodes are also noted. Other: No discrete fluid collections identified. Mild upper abdominal scratch set trace perihepatic and perisplenic fluid. Musculoskeletal: No enhancing bone lesions identified. IMPRESSION: 1. Acute pancreatitis. No evidence for pancreatic necrosis or pseudocyst. 2. Gallstones with mild gallbladder wall thickening and edema. No signs of choledocholithiasis. In  the setting of acute pancreatitis gallbladder wall edema. Gallbladder wall edema may also be seen in the setting of cirrhosis 3. No significant bile duct dilatation or signs of choledocholithiasis. 4. Marked hepatic steatosis with morphologic features of the liver concerning for cirrhosis. 5. Small hiatal hernia. 6. Increased size of upper abdominal lymph nodes. Nonspecific in the setting of cirrhosis as well as pancreatitis. Electronically Signed   By: Signa Kell M.D.   On: 04/01/2023 14:14   MR 3D Recon At Scanner  Result Date: 04/01/2023 CLINICAL DATA:  Pancreatitis.  History of gallstones. EXAM: MRI ABDOMEN WITHOUT AND WITH CONTRAST (INCLUDING MRCP) TECHNIQUE: Multiplanar multisequence MR imaging of the abdomen was performed both before and after the administration of intravenous contrast. Heavily  T2-weighted images of the biliary and pancreatic ducts were obtained, and three-dimensional MRCP images were rendered by post processing. CONTRAST:  10mL GADAVIST GADOBUTROL 1 MMOL/ML IV SOLN COMPARISON:  CT AP 03/31/2023 FINDINGS: Lower chest: No acute abnormality. Hepatobiliary: Marked hepatic steatosis. Hypertrophy of the caudate lobe and lateral segment of left hepatic lobe identified. Contour the liver is slightly nodular. Heterogeneous enhancement of the liver parenchyma is identified. No focal enhancing liver lesions identified. The gallbladder is filled with multiple large stones. The largest is in the gallbladder neck measuring 1.3 cm. There is mild gallbladder wall thickening and edema measuring up to 5 mm, image 34/5. No intrahepatic or common bile duct dilatation. No signs of choledocholithiasis. Pancreas: Changes of acute pancreatitis are again identified with peripancreatic edema. Fluid is seen extending from the pancreas into the retroperitoneum bilaterally. No fluid collections identified to suggest pseudocyst. At the level of the tail the main duct is upper limits of normal measuring 3 mm. No mass identified. No signs of pancreatic necrosis. Spleen:  Within normal limits in size and appearance. Adrenals/Urinary Tract: No masses identified. No evidence of hydronephrosis. Stomach/Bowel: Small hiatal hernia. Stomach is nondistended. No bowel wall thickening or distension identified. Vascular/Lymphatic: Normal caliber abdominal aorta. Upper abdominal vascularity including the portal vein is patent. Portacaval lymph node measures 1.5 cm, image 49/5. Porta hepatic lymph node measures 1 cm, image 43/5. Multiple prominent left retroperitoneal and gastrohepatic ligament lymph nodes are also noted. Other: No discrete fluid collections identified. Mild upper abdominal scratch set trace perihepatic and perisplenic fluid. Musculoskeletal: No enhancing bone lesions identified. IMPRESSION: 1. Acute pancreatitis.  No evidence for pancreatic necrosis or pseudocyst. 2. Gallstones with mild gallbladder wall thickening and edema. No signs of choledocholithiasis. In the setting of acute pancreatitis gallbladder wall edema. Gallbladder wall edema may also be seen in the setting of cirrhosis 3. No significant bile duct dilatation or signs of choledocholithiasis. 4. Marked hepatic steatosis with morphologic features of the liver concerning for cirrhosis. 5. Small hiatal hernia. 6. Increased size of upper abdominal lymph nodes. Nonspecific in the setting of cirrhosis as well as pancreatitis. Electronically Signed   By: Signa Kell M.D.   On: 04/01/2023 14:14   US THYROID  Result Date: 04/01/2023 CLINICAL DATA:  Thyroid nodule seen on CT angiography of the chest EXAM: THYROID ULTRASOUND TECHNIQUE: Ultrasound examination of the thyroid gland and adjacent soft tissues was performed. COMPARISON:  CT angiography chest 03/31/2023 FINDINGS: Parenchymal Echotexture: Mildly heterogenous Isthmus: 0.3 cm Right lobe: 3.0 x 1.6 x 1.3 cm Left lobe: 7.5 x 3.6 x 4.6 cm _________________________________________________________ Estimated total number of nodules >/= 1 cm: 1 Number of spongiform nodules >/=  2 cm not described below (TR1): 0 Number of mixed cystic and solid nodules >/=  1.5 cm not described below (TR2): 0 _________________________________________________________ Nodule # 1: Location: Left; inferior Maximum size: 4.9 cm; Other 2 dimensions: 4.6 x 4.7 cm Composition: solid/almost completely solid (2) Echogenicity: isoechoic (1) Shape: not taller-than-wide (0) Margins: smooth (0) Echogenic foci: none (0) ACR TI-RADS total points: 3. ACR TI-RADS risk category: TR3 (3 points). ACR TI-RADS recommendations: **Given size (>/= 2.5 cm) and appearance, fine needle aspiration of this mildly suspicious nodule should be considered based on TI-RADS criteria. This nodule corresponds to the abnormality seen on chest CT  _________________________________________________________ IMPRESSION: Nodule 1 (TI-RADS 3), measuring 4.9 cm, located in the inferior left thyroid lobe, meets criteria for FNA. The above is in keeping with the ACR TI-RADS recommendations - J Am Coll Radiol 2017;14:587-595. Electronically Signed   By: Acquanetta Belling M.D.   On: 04/01/2023 06:08   US Abdomen Limited RUQ (LIVER/GB)  Result Date: 03/31/2023 CLINICAL DATA:  Epigastric pain. EXAM: ULTRASOUND ABDOMEN LIMITED RIGHT UPPER QUADRANT COMPARISON:  CT earlier today FINDINGS: Gallbladder: Multiple shadowing gallstones obscuring the posterior gallbladder wall. Likely gallbladder sludge. There is mild gallbladder wall thickening at 4-5 mm. No pericholecystic fluid. No sonographic Murphy sign noted by sonographer. Common bile duct: Diameter: 4-5 mm, nondilated. Liver: Heterogeneous and diffusely increased in parenchymal echogenicity. The liver parenchyma is difficult to penetrate. No evidence of focal lesion. No convincing capsular nodularity. Portal vein is patent on color Doppler imaging with normal direction of blood flow towards the liver. Other: No right upper quadrant ascites. IMPRESSION: 1. Gallbladder filled with gallstones. Gallbladder wall thickening of 4-5 mm, nonspecific. Negative sonographic Murphy sign. 2. No biliary dilatation. 3. Hepatic steatosis. Electronically Signed   By: Narda Rutherford M.D.   On: 03/31/2023 21:59   CT L-SPINE NO CHARGE  Result Date: 03/31/2023 CLINICAL DATA:  Polytrauma, blunt.  Fall.  Diffuse abdominal pain. EXAM: CT CERVICAL, THORACIC, AND LUMBAR SPINE WITHOUT CONTRAST TECHNIQUE: Multidetector CT imaging of the cervical, thoracic and lumbar spine was performed without intravenous contrast. Multiplanar CT image reconstructions were also generated. RADIATION DOSE REDUCTION: This exam was performed according to the departmental dose-optimization program which includes automated exposure control, adjustment of the mA and/or  kV according to patient size and/or use of iterative reconstruction technique. COMPARISON:  None Available. FINDINGS: CT CERVICAL SPINE FINDINGS Alignment: Normal. Skull base and vertebrae: Multilevel moderate degenerative changes spine. No associated severe osseous neural foraminal central canal stenosis. No acute fracture. No aggressive appearing focal osseous lesion or focal pathologic process. Soft tissues and spinal canal: No prevertebral fluid or swelling. No visible canal hematoma. Upper chest: Unremarkable. Other: Left thyroid gland nodule-please see separately dictated CT chest 03/31/2023. CT THORACIC SPINE FINDINGS Alignment: Normal. Vertebrae: Severe contiguous osteophyte formation. Multilevel mild facet arthropathy. Posterior longitudinal ligament calcification. No acute fracture or focal pathologic process. Paraspinal and other soft tissues: Negative. Disc levels: Maintained. CT LUMBAR SPINE FINDINGS Segmentation: 5 lumbar type vertebrae. Alignment: Normal. Vertebrae: Multilevel moderate degenerative changes spine with posterior disc osteophyte complex formation at the L2-L3, L3-L4, L4-L5, L5-S1 levels. No associated severe osseous neural foraminal or central canal stenosis. No acute fracture or focal pathologic process. Paraspinal and other soft tissues: Negative. Disc levels: Maintained. Other: Moderate degenerative changes of the right hip. Atherosclerotic plaque. IMPRESSION: 1. No acute displaced fracture or traumatic listhesis of the cervical, thoracic, lumbar spine. 2. Thoracic findings suggestive of DISH. 3. Left thyroid gland nodule-please see separately dictated CT chest 03/31/2023. 4.  Aortic Atherosclerosis (ICD10-I70.0). Electronically Signed   By: Normajean Glasgow.D.  On: 03/31/2023 21:20   CT T-SPINE NO CHARGE  Result Date: 03/31/2023 CLINICAL DATA:  Polytrauma, blunt.  Fall.  Diffuse abdominal pain. EXAM: CT CERVICAL, THORACIC, AND LUMBAR SPINE WITHOUT CONTRAST TECHNIQUE:  Multidetector CT imaging of the cervical, thoracic and lumbar spine was performed without intravenous contrast. Multiplanar CT image reconstructions were also generated. RADIATION DOSE REDUCTION: This exam was performed according to the departmental dose-optimization program which includes automated exposure control, adjustment of the mA and/or kV according to patient size and/or use of iterative reconstruction technique. COMPARISON:  None Available. FINDINGS: CT CERVICAL SPINE FINDINGS Alignment: Normal. Skull base and vertebrae: Multilevel moderate degenerative changes spine. No associated severe osseous neural foraminal central canal stenosis. No acute fracture. No aggressive appearing focal osseous lesion or focal pathologic process. Soft tissues and spinal canal: No prevertebral fluid or swelling. No visible canal hematoma. Upper chest: Unremarkable. Other: Left thyroid gland nodule-please see separately dictated CT chest 03/31/2023. CT THORACIC SPINE FINDINGS Alignment: Normal. Vertebrae: Severe contiguous osteophyte formation. Multilevel mild facet arthropathy. Posterior longitudinal ligament calcification. No acute fracture or focal pathologic process. Paraspinal and other soft tissues: Negative. Disc levels: Maintained. CT LUMBAR SPINE FINDINGS Segmentation: 5 lumbar type vertebrae. Alignment: Normal. Vertebrae: Multilevel moderate degenerative changes spine with posterior disc osteophyte complex formation at the L2-L3, L3-L4, L4-L5, L5-S1 levels. No associated severe osseous neural foraminal or central canal stenosis. No acute fracture or focal pathologic process. Paraspinal and other soft tissues: Negative. Disc levels: Maintained. Other: Moderate degenerative changes of the right hip. Atherosclerotic plaque. IMPRESSION: 1. No acute displaced fracture or traumatic listhesis of the cervical, thoracic, lumbar spine. 2. Thoracic findings suggestive of DISH. 3. Left thyroid gland nodule-please see separately  dictated CT chest 03/31/2023. 4.  Aortic Atherosclerosis (ICD10-I70.0). Electronically Signed   By: Tish Frederickson M.D.   On: 03/31/2023 21:20   CT Cervical Spine Wo Contrast  Result Date: 03/31/2023 CLINICAL DATA:  Polytrauma, blunt.  Fall.  Diffuse abdominal pain. EXAM: CT CERVICAL, THORACIC, AND LUMBAR SPINE WITHOUT CONTRAST TECHNIQUE: Multidetector CT imaging of the cervical, thoracic and lumbar spine was performed without intravenous contrast. Multiplanar CT image reconstructions were also generated. RADIATION DOSE REDUCTION: This exam was performed according to the departmental dose-optimization program which includes automated exposure control, adjustment of the mA and/or kV according to patient size and/or use of iterative reconstruction technique. COMPARISON:  None Available. FINDINGS: CT CERVICAL SPINE FINDINGS Alignment: Normal. Skull base and vertebrae: Multilevel moderate degenerative changes spine. No associated severe osseous neural foraminal central canal stenosis. No acute fracture. No aggressive appearing focal osseous lesion or focal pathologic process. Soft tissues and spinal canal: No prevertebral fluid or swelling. No visible canal hematoma. Upper chest: Unremarkable. Other: Left thyroid gland nodule-please see separately dictated CT chest 03/31/2023. CT THORACIC SPINE FINDINGS Alignment: Normal. Vertebrae: Severe contiguous osteophyte formation. Multilevel mild facet arthropathy. Posterior longitudinal ligament calcification. No acute fracture or focal pathologic process. Paraspinal and other soft tissues: Negative. Disc levels: Maintained. CT LUMBAR SPINE FINDINGS Segmentation: 5 lumbar type vertebrae. Alignment: Normal. Vertebrae: Multilevel moderate degenerative changes spine with posterior disc osteophyte complex formation at the L2-L3, L3-L4, L4-L5, L5-S1 levels. No associated severe osseous neural foraminal or central canal stenosis. No acute fracture or focal pathologic process.  Paraspinal and other soft tissues: Negative. Disc levels: Maintained. Other: Moderate degenerative changes of the right hip. Atherosclerotic plaque. IMPRESSION: 1. No acute displaced fracture or traumatic listhesis of the cervical, thoracic, lumbar spine. 2. Thoracic findings suggestive of DISH. 3. Left thyroid gland nodule-please  see separately dictated CT chest 03/31/2023. 4.  Aortic Atherosclerosis (ICD10-I70.0). Electronically Signed   By: Tish Frederickson M.D.   On: 03/31/2023 21:20   CT Angio Chest PE W/Cm &/Or Wo Cm  Result Date: 03/31/2023 CLINICAL DATA:  Pulmonary embolism (PE) suspected, low to intermediate prob, positive D-dimer; Abdominal pain, acute, nonlocalized elevated bili, hypotensive, fall 2 days ago EXAM: CT ANGIOGRAPHY CHEST CT ABDOMEN AND PELVIS WITH CONTRAST TECHNIQUE: Multidetector CT imaging of the chest was performed using the standard protocol during bolus administration of intravenous contrast. Multiplanar CT image reconstructions and MIPs were obtained to evaluate the vascular anatomy. Multidetector CT imaging of the abdomen and pelvis was performed using the standard protocol during bolus administration of intravenous contrast. RADIATION DOSE REDUCTION: This exam was performed according to the departmental dose-optimization program which includes automated exposure control, adjustment of the mA and/or kV according to patient size and/or use of iterative reconstruction technique. CONTRAST:  OMNIPAQUE IOHEXOL 350 MG/ML SOLN COMPARISON:  None Available. FINDINGS: CTA CHEST FINDINGS Cardiovascular: Satisfactory opacification of the pulmonary arteries to the segmental level. No evidence of pulmonary embolism. Normal heart size. No significant pericardial effusion. The thoracic aorta is normal in caliber. Mild atherosclerotic plaque of the thoracic aorta. No coronary artery calcifications. Mediastinum/Nodes: Fat stranding and soft tissue density with enlarged lymph nodes along the  low pre-vascular space (5: 66-106). Enlarged prevascular lymph node measuring 1.6 cm. No enlarged hilar or axillary lymph nodes. Trachea and esophagus esophagus demonstrate no significant findings. Tiny hiatal hernia. Enlarged left thyroid gland with a heterogeneous hypodense 4.2 x 4.6 cm thyroid gland nodule. Lungs/Pleura: Low lung volumes. Bilateral lower lobe atelectasis. No focal consolidation. No pulmonary nodule. No pulmonary mass. No pleural effusion. No pneumothorax. Musculoskeletal: No chest wall abnormality. No suspicious lytic or blastic osseous lesions. No acute displaced fracture. Please see separately dictated CT thoracic spine 03/31/2023. Review of the MIP images confirms the above findings. CT ABDOMEN and PELVIS FINDINGS Hepatobiliary: The liver is enlarged measuring up to 21.5 cm. The hepatic parenchyma is diffusely hypodense compared to the splenic parenchyma consistent with fatty infiltration. No focal liver abnormality. Calcified gallstones within the gallbladder lumen. Question gallbladder wall thickening. No definite pericholecystic fluid. No biliary dilatation. Pancreas: Peripancreatic fat stranding. Slight haziness of the pancreatic contour proximally. The pancreas appears slightly diffusely atrophic. No main pancreatic ductal dilatation. Spleen: Normal in size without focal abnormality. Adrenals/Urinary Tract: No adrenal nodule bilaterally. Bilateral kidneys enhance symmetrically. No hydronephrosis. No hydroureter. Right nephrolithiasis measuring up to 5 mm. No left nephrolithiasis. No ureterolithiasis bilaterally. The urinary bladder is unremarkable. On delayed imaging, there is no urothelial wall thickening and there are no filling defects in the opacified portions of the bilateral collecting systems or ureters. Stomach/Bowel: Stomach is within normal limits. No evidence of bowel wall thickening or dilatation. Colonic diverticulosis. Appendix appears normal. Vascular/Lymphatic: No  abdominal aorta or iliac aneurysm. Moderate atherosclerotic plaque of the aorta and its branches. No abdominal, pelvic, or inguinal lymphadenopathy. Reproductive: The prostate is enlarged measuring up to 5 cm. Other: No intraperitoneal free fluid. No intraperitoneal free gas. No organized fluid collection. Musculoskeletal: Trace right fat containing inguinal hernia. Small fat containing left inguinal hernia. No suspicious lytic or blastic osseous lesions. No acute displaced fracture. Please see separately dictated CT lumbar spine 03/31/2023. Review of the MIP images confirms the above findings. IMPRESSION: 1. No pulmonary embolus. 2. A 4.6 cm left thyroid gland nodule with prevascular space mediastinal soft tissue density and enlarged lymph nodes. Recommend thyroid  US (ref: J Am Coll Radiol. 2015 Feb;12(2): 143-50). Given mediastinal findings, consider PET-CT following thyroid ultrasound. 3. Acute pancreatitis.  No pseudocyst formation. 4. Cholelithiasis with question associated gallbladder wall thickening. Consider right upper quadrant ultrasound for further evaluation. 5. Bilateral lower lobe atelectasis. Superimposed developing infection not fully excluded. Followup PA and lateral chest X-ray is recommended in 3-4 weeks following therapy to ensure resolution. 6. Other imaging findings of potential clinical significance: Tiny hiatal hernia. Hepatic steatosis and hepatomegaly. Nonobstructive right nephrolithiasis measuring up to 5 mm. Colonic diverticulosis with no acute diverticulitis. Prostatomegaly. Trace right fat containing inguinal hernia. Small fat containing left inguinal hernia. Aortic Atherosclerosis (ICD10-I70.0). 7. No acute intrathoracic, intra-abdominal, intrapelvic traumatic injury. 8. Please see separately dictated CT thoracolumbar spine 03/31/2023. Electronically Signed   By: Tish Frederickson M.D.   On: 03/31/2023 20:53   CT Abdomen Pelvis W Contrast  Result Date: 03/31/2023 CLINICAL DATA:   Pulmonary embolism (PE) suspected, low to intermediate prob, positive D-dimer; Abdominal pain, acute, nonlocalized elevated bili, hypotensive, fall 2 days ago EXAM: CT ANGIOGRAPHY CHEST CT ABDOMEN AND PELVIS WITH CONTRAST TECHNIQUE: Multidetector CT imaging of the chest was performed using the standard protocol during bolus administration of intravenous contrast. Multiplanar CT image reconstructions and MIPs were obtained to evaluate the vascular anatomy. Multidetector CT imaging of the abdomen and pelvis was performed using the standard protocol during bolus administration of intravenous contrast. RADIATION DOSE REDUCTION: This exam was performed according to the departmental dose-optimization program which includes automated exposure control, adjustment of the mA and/or kV according to patient size and/or use of iterative reconstruction technique. CONTRAST:  OMNIPAQUE IOHEXOL 350 MG/ML SOLN COMPARISON:  None Available. FINDINGS: CTA CHEST FINDINGS Cardiovascular: Satisfactory opacification of the pulmonary arteries to the segmental level. No evidence of pulmonary embolism. Normal heart size. No significant pericardial effusion. The thoracic aorta is normal in caliber. Mild atherosclerotic plaque of the thoracic aorta. No coronary artery calcifications. Mediastinum/Nodes: Fat stranding and soft tissue density with enlarged lymph nodes along the low pre-vascular space (5: 66-106). Enlarged prevascular lymph node measuring 1.6 cm. No enlarged hilar or axillary lymph nodes. Trachea and esophagus esophagus demonstrate no significant findings. Tiny hiatal hernia. Enlarged left thyroid gland with a heterogeneous hypodense 4.2 x 4.6 cm thyroid gland nodule. Lungs/Pleura: Low lung volumes. Bilateral lower lobe atelectasis. No focal consolidation. No pulmonary nodule. No pulmonary mass. No pleural effusion. No pneumothorax. Musculoskeletal: No chest wall abnormality. No suspicious lytic or blastic osseous lesions. No  acute displaced fracture. Please see separately dictated CT thoracic spine 03/31/2023. Review of the MIP images confirms the above findings. CT ABDOMEN and PELVIS FINDINGS Hepatobiliary: The liver is enlarged measuring up to 21.5 cm. The hepatic parenchyma is diffusely hypodense compared to the splenic parenchyma consistent with fatty infiltration. No focal liver abnormality. Calcified gallstones within the gallbladder lumen. Question gallbladder wall thickening. No definite pericholecystic fluid. No biliary dilatation. Pancreas: Peripancreatic fat stranding. Slight haziness of the pancreatic contour proximally. The pancreas appears slightly diffusely atrophic. No main pancreatic ductal dilatation. Spleen: Normal in size without focal abnormality. Adrenals/Urinary Tract: No adrenal nodule bilaterally. Bilateral kidneys enhance symmetrically. No hydronephrosis. No hydroureter. Right nephrolithiasis measuring up to 5 mm. No left nephrolithiasis. No ureterolithiasis bilaterally. The urinary bladder is unremarkable. On delayed imaging, there is no urothelial wall thickening and there are no filling defects in the opacified portions of the bilateral collecting systems or ureters. Stomach/Bowel: Stomach is within normal limits. No evidence of bowel wall thickening or dilatation. Colonic diverticulosis.  Appendix appears normal. Vascular/Lymphatic: No abdominal aorta or iliac aneurysm. Moderate atherosclerotic plaque of the aorta and its branches. No abdominal, pelvic, or inguinal lymphadenopathy. Reproductive: The prostate is enlarged measuring up to 5 cm. Other: No intraperitoneal free fluid. No intraperitoneal free gas. No organized fluid collection. Musculoskeletal: Trace right fat containing inguinal hernia. Small fat containing left inguinal hernia. No suspicious lytic or blastic osseous lesions. No acute displaced fracture. Please see separately dictated CT lumbar spine 03/31/2023. Review of the MIP images confirms  the above findings. IMPRESSION: 1. No pulmonary embolus. 2. A 4.6 cm left thyroid gland nodule with prevascular space mediastinal soft tissue density and enlarged lymph nodes. Recommend thyroid US (ref: J Am Coll Radiol. 2015 Feb;12(2): 143-50). Given mediastinal findings, consider PET-CT following thyroid ultrasound. 3. Acute pancreatitis.  No pseudocyst formation. 4. Cholelithiasis with question associated gallbladder wall thickening. Consider right upper quadrant ultrasound for further evaluation. 5. Bilateral lower lobe atelectasis. Superimposed developing infection not fully excluded. Followup PA and lateral chest X-ray is recommended in 3-4 weeks following therapy to ensure resolution. 6. Other imaging findings of potential clinical significance: Tiny hiatal hernia. Hepatic steatosis and hepatomegaly. Nonobstructive right nephrolithiasis measuring up to 5 mm. Colonic diverticulosis with no acute diverticulitis. Prostatomegaly. Trace right fat containing inguinal hernia. Small fat containing left inguinal hernia. Aortic Atherosclerosis (ICD10-I70.0). 7. No acute intrathoracic, intra-abdominal, intrapelvic traumatic injury. 8. Please see separately dictated CT thoracolumbar spine 03/31/2023. Electronically Signed   By: Tish Frederickson M.D.   On: 03/31/2023 20:53   DG Chest Port 1 View  Result Date: 03/31/2023 CLINICAL DATA:  fall EXAM: PORTABLE CHEST 1 VIEW COMPARISON:  Chest x-ray 01/30/2018 FINDINGS: The heart and mediastinal contours are unchanged. Bibasilar, right greater than left, patchy and streaky airspace opacities. No pulmonary edema. No pleural effusion. No pneumothorax. No acute osseous abnormality. IMPRESSION: Bibasilar, right greater than left, patchy and streaky airspace opacities. Finding likely combination of atelectasis versus infection/inflammation. Followup PA and lateral chest X-ray is recommended in 3-4 weeks following therapy to ensure resolution and exclude underlying malignancy.  Electronically Signed   By: Tish Frederickson M.D.   On: 03/31/2023 17:19   DG Pelvis Portable  Result Date: 03/31/2023 CLINICAL DATA:  Trauma EXAM: PORTABLE PELVIS 1 VIEWS COMPARISON:  None Available. FINDINGS: Limited examination consisting of a single view of both hips demonstrates bilateral hip degenerative changes. This examination can not exclude fractures. No obvious displaced fractures are seen. Pelvic ring appears to be intact. IMPRESSION: Bilateral hip degenerative changes. Evaluation for fractures was limited by lack of additional views. Electronically Signed   By: Layla Maw M.D.   On: 03/31/2023 17:19    LOS: 2 days   Author: Calvert Cantor  04/02/2023 4:34 PM  To contact [facility]>   Check the care team in [facility] and look for the attending/consulting [facility] provider listed  Log into [email or web address] and use [place]'s universal password   Go to> "[facility]"  and find provider  If you still have difficulty reaching the provider, please page the [facility] (Director on Call) for the Hospitalists listed on amion

## 2023-04-02 NOTE — Consult Note (Signed)
[facility] Consultation Note  Referring Provider: [facility] Primary Care Physician:  Patient, No Pcp Per Primary Gastroenterologist:  Gentry Fitz  Reason for Consultation:  pancreatitis  HPI: Brian [name] is a 62 y.o. male admitted after fall.  Drinks alcohol regularly.  Has gallstones.  Imaging/labs suggestive pancreatitis, but patient has NO abdominal pain.  Eating fine.  No nausea, vomiting, blood in stool, unintentional weigh tloss.   Past Medical History:  Diagnosis Date   Hypertension     No past surgical history on file.  Prior to Admission medications   Medication Sig Start Date End Date Taking? Authorizing Provider  hydrochlorothiazide (HYDRODIURIL) 25 MG tablet Take 1 tablet (25 mg total) by mouth daily. 01/30/18 04/02/23 Yes Cardama, Amadeo Garnet, MD  Multiple Vitamins-Minerals (MULTIVITAMIN WITH MINERALS) tablet Take 1 tablet by mouth daily.   Yes [provider]  Omega-3 Fatty Acids (FISH OIL) 1000 MG CAPS Take 2,000 mg by mouth daily.   Yes [provider]    Current Facility-Administered Medications  Medication Dose Route Frequency Provider Last Rate Last Admin   0.9 % NaCl with KCl 40 mEq / L  infusion   Intravenous Continuous Calvert Cantor, MD 100 mL/hr at 04/02/23 0943 New Bag at 04/02/23 0943   acetaminophen (TYLENOL) tablet 650 mg  650 mg Oral Q6H PRN Frankey Shown, DO       Or   acetaminophen (TYLENOL) suppository 650 mg  650 mg Rectal Q6H PRN Adefeso, Oladapo, DO       amLODipine (NORVASC) tablet 5 mg  5 mg Oral Daily Adefeso, Oladapo, DO   5 mg at 04/02/23 0938   enoxaparin (LOVENOX) injection 40 mg  40 mg Subcutaneous Q24H Adefeso, Oladapo, DO   40 mg at 04/02/23 0937   feeding supplement (ENSURE ENLIVE / ENSURE PLUS) liquid 237 mL  237 mL Oral BID BM Adefeso, Oladapo, DO   237 mL at 04/02/23 0946   folic acid (FOLVITE) tablet 1 mg  1 mg Oral Daily Calvert Cantor, MD   1 mg at 04/02/23 1610   labetalol (NORMODYNE)  injection 10 mg  10 mg Intravenous Q2H PRN Calvert Cantor, MD   10 mg at 04/01/23 1800   LORazepam (ATIVAN) tablet 1-4 mg  1-4 mg Oral Q1H PRN Calvert Cantor, MD   1 mg at 04/01/23 2025   multivitamin with minerals tablet 1 tablet  1 tablet Oral Daily Calvert Cantor, MD   1 tablet at 04/02/23 0937   ondansetron (ZOFRAN) tablet 4 mg  4 mg Oral Q6H PRN Adefeso, Oladapo, DO       Or   ondansetron (ZOFRAN) injection 4 mg  4 mg Intravenous Q6H PRN Adefeso, Oladapo, DO       pantoprazole (PROTONIX) EC tablet 40 mg  40 mg Oral Daily Adefeso, Oladapo, DO   40 mg at 04/02/23 9604   potassium chloride SA (KLOR-CON M) CR tablet 40 mEq  40 mEq Oral Q4H Calvert Cantor, MD   40 mEq at 04/02/23 5409   thiamine (VITAMIN B1) tablet 100 mg  100 mg Oral Daily Calvert Cantor, MD   100 mg at 04/02/23 8119   Or   thiamine (VITAMIN B1) injection 100 mg  100 mg Intravenous Daily Calvert Cantor, MD        Allergies as of 03/31/2023   (No Known Allergies)    No family history on file.  Social History   Socioeconomic History   Marital status: Single    Spouse name: Not on  file   Number of children: Not on file   Years of education: Not on file   Highest education level: Not on file  Occupational History   Not on file  Tobacco Use   Smoking status: Not on file   Smokeless tobacco: Not on file  Substance and Sexual Activity   Alcohol use: Not on file   Drug use: Not on file   Sexual activity: Not on file  Other Topics Concern   Not on file  Social History Narrative   Not on file   Social Determinants of Health   Financial Resource Strain: Not on file  Food Insecurity: No Food Insecurity (04/01/2023)   Hunger Vital Sign    Worried About Running Out of Food in the Last Year: Never true    Ran Out of Food in the Last Year: Never true  Transportation Needs: Unmet Transportation Needs (04/01/2023)   PRAPARE - Administrator, Civil Service (Medical): Yes    Lack of Transportation (Non-Medical):  Yes  Physical Activity: Not on file  Stress: Not on file  Social Connections: Not on file  Intimate Partner Violence: Not At Risk (04/01/2023)   Humiliation, Afraid, Rape, and Kick questionnaire    Fear of Current or Ex-Partner: No    Emotionally Abused: No    Physically Abused: No    Sexually Abused: No    Review of Systems: As per HPI, all others negative  Physical Exam: Vital signs in last 24 hours: Temp:  [98.4 F (36.9 C)-100.2 F (37.9 C)] 100.2 F (37.9 C) (04/14 0912) Pulse Rate:  [109-132] 109 (04/14 0912) Resp:  [16-20] 20 (04/14 0912) BP: (138-191)/(79-121) 138/93 (04/14 0912) SpO2:  [93 %-100 %] 95 % (04/14 0912) Last BM Date : 04/01/23 General:   Alert,  Well-developed, well-nourished, pleasant and cooperative in NAD Head:  Normocephalic and atraumatic. Eyes:  Sclera clear, no icterus.   Conjunctiva pink. Ears:  Normal auditory acuity. Nose:  No deformity, discharge,  or lesions. Mouth:  No deformity or lesions.  Oropharynx pink & moist. Neck:  Supple; no masses or thyromegaly. Lungs:  No respiratory distress Abdomen:  Soft, protuberant, non-tender, No masses, hepatosplenomegaly or hernias noted. Normal bowel sounds, without guarding, and without rebound.     Msk:  Symmetrical without gross deformities. Normal posture. Pulses:  Normal pulses noted. Extremities:  Without clubbing or edema. Neurologic:  Alert and  oriented x4;  grossly normal neurologically. Skin:  Intact without significant lesions or rashes. Psych:  Alert and cooperative. Normal mood and affect.   Lab Results: Recent Labs    03/31/23 1633 04/01/23 0458 04/02/23 0526  WBC 5.0 6.2 3.9*  HGB 13.5 13.1 11.5*  HCT 38.9* 38.0* 34.1*  PLT 282 253 161   BMET Recent Labs    04/01/23 0458 04/01/23 1512 04/02/23 0526  NA 137 140 137  K 3.1* 3.1* 2.8*  CL 103 105 102  CO2 20* 21* 25  GLUCOSE 65* 111* 132*  BUN 9 9 6   CREATININE 0.56* 0.52* 0.44*  CALCIUM 7.5* 8.0* 8.0*   LFT Recent  Labs    04/02/23 0526  PROT 7.1  ALBUMIN 2.5*  AST 155*  ALT 47*  ALKPHOS 111  BILITOT 7.7*   PT/INR Recent Labs    04/02/23 1114  LABPROT 16.6*  INR 1.4*    Studies/Results: MR ABDOMEN MRCP W WO CONTAST  Result Date: 04/01/2023 CLINICAL DATA:  Pancreatitis.  History of gallstones. EXAM: MRI ABDOMEN WITHOUT AND WITH  CONTRAST (INCLUDING MRCP) TECHNIQUE: Multiplanar multisequence MR imaging of the abdomen was performed both before and after the administration of intravenous contrast. Heavily T2-weighted images of the biliary and pancreatic ducts were obtained, and three-dimensional MRCP images were rendered by post processing. CONTRAST:  10mL GADAVIST GADOBUTROL 1 MMOL/ML IV SOLN COMPARISON:  CT AP 03/31/2023 FINDINGS: Lower chest: No acute abnormality. Hepatobiliary: Marked hepatic steatosis. Hypertrophy of the caudate lobe and lateral segment of left hepatic lobe identified. Contour the liver is slightly nodular. Heterogeneous enhancement of the liver parenchyma is identified. No focal enhancing liver lesions identified. The gallbladder is filled with multiple large stones. The largest is in the gallbladder neck measuring 1.3 cm. There is mild gallbladder wall thickening and edema measuring up to 5 mm, image 34/5. No intrahepatic or common bile duct dilatation. No signs of choledocholithiasis. Pancreas: Changes of acute pancreatitis are again identified with peripancreatic edema. Fluid is seen extending from the pancreas into the retroperitoneum bilaterally. No fluid collections identified to suggest pseudocyst. At the level of the tail the main duct is upper limits of normal measuring 3 mm. No mass identified. No signs of pancreatic necrosis. Spleen:  Within normal limits in size and appearance. Adrenals/Urinary Tract: No masses identified. No evidence of hydronephrosis. Stomach/Bowel: Small hiatal hernia. Stomach is nondistended. No bowel wall thickening or distension identified.  Vascular/Lymphatic: Normal caliber abdominal aorta. Upper abdominal vascularity including the portal vein is patent. Portacaval lymph node measures 1.5 cm, image 49/5. Porta hepatic lymph node measures 1 cm, image 43/5. Multiple prominent left retroperitoneal and gastrohepatic ligament lymph nodes are also noted. Other: No discrete fluid collections identified. Mild upper abdominal scratch set trace perihepatic and perisplenic fluid. Musculoskeletal: No enhancing bone lesions identified. IMPRESSION: 1. Acute pancreatitis. No evidence for pancreatic necrosis or pseudocyst. 2. Gallstones with mild gallbladder wall thickening and edema. No signs of choledocholithiasis. In the setting of acute pancreatitis gallbladder wall edema. Gallbladder wall edema may also be seen in the setting of cirrhosis 3. No significant bile duct dilatation or signs of choledocholithiasis. 4. Marked hepatic steatosis with morphologic features of the liver concerning for cirrhosis. 5. Small hiatal hernia. 6. Increased size of upper abdominal lymph nodes. Nonspecific in the setting of cirrhosis as well as pancreatitis. Electronically Signed   By: Signa Kell M.D.   On: 04/01/2023 14:14   MR 3D Recon At Scanner  Result Date: 04/01/2023 CLINICAL DATA:  Pancreatitis.  History of gallstones. EXAM: MRI ABDOMEN WITHOUT AND WITH CONTRAST (INCLUDING MRCP) TECHNIQUE: Multiplanar multisequence MR imaging of the abdomen was performed both before and after the administration of intravenous contrast. Heavily T2-weighted images of the biliary and pancreatic ducts were obtained, and three-dimensional MRCP images were rendered by post processing. CONTRAST:  10mL GADAVIST GADOBUTROL 1 MMOL/ML IV SOLN COMPARISON:  CT AP 03/31/2023 FINDINGS: Lower chest: No acute abnormality. Hepatobiliary: Marked hepatic steatosis. Hypertrophy of the caudate lobe and lateral segment of left hepatic lobe identified. Contour the liver is slightly nodular. Heterogeneous  enhancement of the liver parenchyma is identified. No focal enhancing liver lesions identified. The gallbladder is filled with multiple large stones. The largest is in the gallbladder neck measuring 1.3 cm. There is mild gallbladder wall thickening and edema measuring up to 5 mm, image 34/5. No intrahepatic or common bile duct dilatation. No signs of choledocholithiasis. Pancreas: Changes of acute pancreatitis are again identified with peripancreatic edema. Fluid is seen extending from the pancreas into the retroperitoneum bilaterally. No fluid collections identified to suggest pseudocyst. At the level  of the tail the main duct is upper limits of normal measuring 3 mm. No mass identified. No signs of pancreatic necrosis. Spleen:  Within normal limits in size and appearance. Adrenals/Urinary Tract: No masses identified. No evidence of hydronephrosis. Stomach/Bowel: Small hiatal hernia. Stomach is nondistended. No bowel wall thickening or distension identified. Vascular/Lymphatic: Normal caliber abdominal aorta. Upper abdominal vascularity including the portal vein is patent. Portacaval lymph node measures 1.5 cm, image 49/5. Porta hepatic lymph node measures 1 cm, image 43/5. Multiple prominent left retroperitoneal and gastrohepatic ligament lymph nodes are also noted. Other: No discrete fluid collections identified. Mild upper abdominal scratch set trace perihepatic and perisplenic fluid. Musculoskeletal: No enhancing bone lesions identified. IMPRESSION: 1. Acute pancreatitis. No evidence for pancreatic necrosis or pseudocyst. 2. Gallstones with mild gallbladder wall thickening and edema. No signs of choledocholithiasis. In the setting of acute pancreatitis gallbladder wall edema. Gallbladder wall edema may also be seen in the setting of cirrhosis 3. No significant bile duct dilatation or signs of choledocholithiasis. 4. Marked hepatic steatosis with morphologic features of the liver concerning for cirrhosis. 5.  Small hiatal hernia. 6. Increased size of upper abdominal lymph nodes. Nonspecific in the setting of cirrhosis as well as pancreatitis. Electronically Signed   By: Signa Kell M.D.   On: 04/01/2023 14:14   US THYROID  Result Date: 04/01/2023 CLINICAL DATA:  Thyroid nodule seen on CT angiography of the chest EXAM: THYROID ULTRASOUND TECHNIQUE: Ultrasound examination of the thyroid gland and adjacent soft tissues was performed. COMPARISON:  CT angiography chest 03/31/2023 FINDINGS: Parenchymal Echotexture: Mildly heterogenous Isthmus: 0.3 cm Right lobe: 3.0 x 1.6 x 1.3 cm Left lobe: 7.5 x 3.6 x 4.6 cm _________________________________________________________ Estimated total number of nodules >/= 1 cm: 1 Number of spongiform nodules >/=  2 cm not described below (TR1): 0 Number of mixed cystic and solid nodules >/= 1.5 cm not described below (TR2): 0 _________________________________________________________ Nodule # 1: Location: Left; inferior Maximum size: 4.9 cm; Other 2 dimensions: 4.6 x 4.7 cm Composition: solid/almost completely solid (2) Echogenicity: isoechoic (1) Shape: not taller-than-wide (0) Margins: smooth (0) Echogenic foci: none (0) ACR TI-RADS total points: 3. ACR TI-RADS risk category: TR3 (3 points). ACR TI-RADS recommendations: **Given size (>/= 2.5 cm) and appearance, fine needle aspiration of this mildly suspicious nodule should be considered based on TI-RADS criteria. This nodule corresponds to the abnormality seen on chest CT _________________________________________________________ IMPRESSION: Nodule 1 (TI-RADS 3), measuring 4.9 cm, located in the inferior left thyroid lobe, meets criteria for FNA. The above is in keeping with the ACR TI-RADS recommendations - J Am Coll Radiol 2017;14:587-595. Electronically Signed   By: Acquanetta Belling M.D.   On: 04/01/2023 06:08   US Abdomen Limited RUQ (LIVER/GB)  Result Date: 03/31/2023 CLINICAL DATA:  Epigastric pain. EXAM: ULTRASOUND ABDOMEN LIMITED  RIGHT UPPER QUADRANT COMPARISON:  CT earlier today FINDINGS: Gallbladder: Multiple shadowing gallstones obscuring the posterior gallbladder wall. Likely gallbladder sludge. There is mild gallbladder wall thickening at 4-5 mm. No pericholecystic fluid. No sonographic Murphy sign noted by sonographer. Common bile duct: Diameter: 4-5 mm, nondilated. Liver: Heterogeneous and diffusely increased in parenchymal echogenicity. The liver parenchyma is difficult to penetrate. No evidence of focal lesion. No convincing capsular nodularity. Portal vein is patent on color Doppler imaging with normal direction of blood flow towards the liver. Other: No right upper quadrant ascites. IMPRESSION: 1. Gallbladder filled with gallstones. Gallbladder wall thickening of 4-5 mm, nonspecific. Negative sonographic Murphy sign. 2. No biliary dilatation. 3. Hepatic  steatosis. Electronically Signed   By: Narda Rutherford M.D.   On: 03/31/2023 21:59   CT L-SPINE NO CHARGE  Result Date: 03/31/2023 CLINICAL DATA:  Polytrauma, blunt.  Fall.  Diffuse abdominal pain. EXAM: CT CERVICAL, THORACIC, AND LUMBAR SPINE WITHOUT CONTRAST TECHNIQUE: Multidetector CT imaging of the cervical, thoracic and lumbar spine was performed without intravenous contrast. Multiplanar CT image reconstructions were also generated. RADIATION DOSE REDUCTION: This exam was performed according to the departmental dose-optimization program which includes automated exposure control, adjustment of the mA and/or kV according to patient size and/or use of iterative reconstruction technique. COMPARISON:  None Available. FINDINGS: CT CERVICAL SPINE FINDINGS Alignment: Normal. Skull base and vertebrae: Multilevel moderate degenerative changes spine. No associated severe osseous neural foraminal central canal stenosis. No acute fracture. No aggressive appearing focal osseous lesion or focal pathologic process. Soft tissues and spinal canal: No prevertebral fluid or swelling. No  visible canal hematoma. Upper chest: Unremarkable. Other: Left thyroid gland nodule-please see separately dictated CT chest 03/31/2023. CT THORACIC SPINE FINDINGS Alignment: Normal. Vertebrae: Severe contiguous osteophyte formation. Multilevel mild facet arthropathy. Posterior longitudinal ligament calcification. No acute fracture or focal pathologic process. Paraspinal and other soft tissues: Negative. Disc levels: Maintained. CT LUMBAR SPINE FINDINGS Segmentation: 5 lumbar type vertebrae. Alignment: Normal. Vertebrae: Multilevel moderate degenerative changes spine with posterior disc osteophyte complex formation at the L2-L3, L3-L4, L4-L5, L5-S1 levels. No associated severe osseous neural foraminal or central canal stenosis. No acute fracture or focal pathologic process. Paraspinal and other soft tissues: Negative. Disc levels: Maintained. Other: Moderate degenerative changes of the right hip. Atherosclerotic plaque. IMPRESSION: 1. No acute displaced fracture or traumatic listhesis of the cervical, thoracic, lumbar spine. 2. Thoracic findings suggestive of DISH. 3. Left thyroid gland nodule-please see separately dictated CT chest 03/31/2023. 4.  Aortic Atherosclerosis (ICD10-I70.0). Electronically Signed   By: Tish Frederickson M.D.   On: 03/31/2023 21:20   CT T-SPINE NO CHARGE  Result Date: 03/31/2023 CLINICAL DATA:  Polytrauma, blunt.  Fall.  Diffuse abdominal pain. EXAM: CT CERVICAL, THORACIC, AND LUMBAR SPINE WITHOUT CONTRAST TECHNIQUE: Multidetector CT imaging of the cervical, thoracic and lumbar spine was performed without intravenous contrast. Multiplanar CT image reconstructions were also generated. RADIATION DOSE REDUCTION: This exam was performed according to the departmental dose-optimization program which includes automated exposure control, adjustment of the mA and/or kV according to patient size and/or use of iterative reconstruction technique. COMPARISON:  None Available. FINDINGS: CT CERVICAL  SPINE FINDINGS Alignment: Normal. Skull base and vertebrae: Multilevel moderate degenerative changes spine. No associated severe osseous neural foraminal central canal stenosis. No acute fracture. No aggressive appearing focal osseous lesion or focal pathologic process. Soft tissues and spinal canal: No prevertebral fluid or swelling. No visible canal hematoma. Upper chest: Unremarkable. Other: Left thyroid gland nodule-please see separately dictated CT chest 03/31/2023. CT THORACIC SPINE FINDINGS Alignment: Normal. Vertebrae: Severe contiguous osteophyte formation. Multilevel mild facet arthropathy. Posterior longitudinal ligament calcification. No acute fracture or focal pathologic process. Paraspinal and other soft tissues: Negative. Disc levels: Maintained. CT LUMBAR SPINE FINDINGS Segmentation: 5 lumbar type vertebrae. Alignment: Normal. Vertebrae: Multilevel moderate degenerative changes spine with posterior disc osteophyte complex formation at the L2-L3, L3-L4, L4-L5, L5-S1 levels. No associated severe osseous neural foraminal or central canal stenosis. No acute fracture or focal pathologic process. Paraspinal and other soft tissues: Negative. Disc levels: Maintained. Other: Moderate degenerative changes of the right hip. Atherosclerotic plaque. IMPRESSION: 1. No acute displaced fracture or traumatic listhesis of the cervical, thoracic, lumbar spine. 2.  Thoracic findings suggestive of DISH. 3. Left thyroid gland nodule-please see separately dictated CT chest 03/31/2023. 4.  Aortic Atherosclerosis (ICD10-I70.0). Electronically Signed   By: Tish Frederickson M.D.   On: 03/31/2023 21:20   CT Cervical Spine Wo Contrast  Result Date: 03/31/2023 CLINICAL DATA:  Polytrauma, blunt.  Fall.  Diffuse abdominal pain. EXAM: CT CERVICAL, THORACIC, AND LUMBAR SPINE WITHOUT CONTRAST TECHNIQUE: Multidetector CT imaging of the cervical, thoracic and lumbar spine was performed without intravenous contrast. Multiplanar CT  image reconstructions were also generated. RADIATION DOSE REDUCTION: This exam was performed according to the departmental dose-optimization program which includes automated exposure control, adjustment of the mA and/or kV according to patient size and/or use of iterative reconstruction technique. COMPARISON:  None Available. FINDINGS: CT CERVICAL SPINE FINDINGS Alignment: Normal. Skull base and vertebrae: Multilevel moderate degenerative changes spine. No associated severe osseous neural foraminal central canal stenosis. No acute fracture. No aggressive appearing focal osseous lesion or focal pathologic process. Soft tissues and spinal canal: No prevertebral fluid or swelling. No visible canal hematoma. Upper chest: Unremarkable. Other: Left thyroid gland nodule-please see separately dictated CT chest 03/31/2023. CT THORACIC SPINE FINDINGS Alignment: Normal. Vertebrae: Severe contiguous osteophyte formation. Multilevel mild facet arthropathy. Posterior longitudinal ligament calcification. No acute fracture or focal pathologic process. Paraspinal and other soft tissues: Negative. Disc levels: Maintained. CT LUMBAR SPINE FINDINGS Segmentation: 5 lumbar type vertebrae. Alignment: Normal. Vertebrae: Multilevel moderate degenerative changes spine with posterior disc osteophyte complex formation at the L2-L3, L3-L4, L4-L5, L5-S1 levels. No associated severe osseous neural foraminal or central canal stenosis. No acute fracture or focal pathologic process. Paraspinal and other soft tissues: Negative. Disc levels: Maintained. Other: Moderate degenerative changes of the right hip. Atherosclerotic plaque. IMPRESSION: 1. No acute displaced fracture or traumatic listhesis of the cervical, thoracic, lumbar spine. 2. Thoracic findings suggestive of DISH. 3. Left thyroid gland nodule-please see separately dictated CT chest 03/31/2023. 4.  Aortic Atherosclerosis (ICD10-I70.0). Electronically Signed   By: Tish Frederickson M.D.   On:  03/31/2023 21:20   CT Angio Chest PE W/Cm &/Or Wo Cm  Result Date: 03/31/2023 CLINICAL DATA:  Pulmonary embolism (PE) suspected, low to intermediate prob, positive D-dimer; Abdominal pain, acute, nonlocalized elevated bili, hypotensive, fall 2 days ago EXAM: CT ANGIOGRAPHY CHEST CT ABDOMEN AND PELVIS WITH CONTRAST TECHNIQUE: Multidetector CT imaging of the chest was performed using the standard protocol during bolus administration of intravenous contrast. Multiplanar CT image reconstructions and MIPs were obtained to evaluate the vascular anatomy. Multidetector CT imaging of the abdomen and pelvis was performed using the standard protocol during bolus administration of intravenous contrast. RADIATION DOSE REDUCTION: This exam was performed according to the departmental dose-optimization program which includes automated exposure control, adjustment of the mA and/or kV according to patient size and/or use of iterative reconstruction technique. CONTRAST:  OMNIPAQUE IOHEXOL 350 MG/ML SOLN COMPARISON:  None Available. FINDINGS: CTA CHEST FINDINGS Cardiovascular: Satisfactory opacification of the pulmonary arteries to the segmental level. No evidence of pulmonary embolism. Normal heart size. No significant pericardial effusion. The thoracic aorta is normal in caliber. Mild atherosclerotic plaque of the thoracic aorta. No coronary artery calcifications. Mediastinum/Nodes: Fat stranding and soft tissue density with enlarged lymph nodes along the low pre-vascular space (5: 66-106). Enlarged prevascular lymph node measuring 1.6 cm. No enlarged hilar or axillary lymph nodes. Trachea and esophagus esophagus demonstrate no significant findings. Tiny hiatal hernia. Enlarged left thyroid gland with a heterogeneous hypodense 4.2 x 4.6 cm thyroid gland nodule. Lungs/Pleura: Low lung volumes.  Bilateral lower lobe atelectasis. No focal consolidation. No pulmonary nodule. No pulmonary mass. No pleural effusion. No  pneumothorax. Musculoskeletal: No chest wall abnormality. No suspicious lytic or blastic osseous lesions. No acute displaced fracture. Please see separately dictated CT thoracic spine 03/31/2023. Review of the MIP images confirms the above findings. CT ABDOMEN and PELVIS FINDINGS Hepatobiliary: The liver is enlarged measuring up to 21.5 cm. The hepatic parenchyma is diffusely hypodense compared to the splenic parenchyma consistent with fatty infiltration. No focal liver abnormality. Calcified gallstones within the gallbladder lumen. Question gallbladder wall thickening. No definite pericholecystic fluid. No biliary dilatation. Pancreas: Peripancreatic fat stranding. Slight haziness of the pancreatic contour proximally. The pancreas appears slightly diffusely atrophic. No main pancreatic ductal dilatation. Spleen: Normal in size without focal abnormality. Adrenals/Urinary Tract: No adrenal nodule bilaterally. Bilateral kidneys enhance symmetrically. No hydronephrosis. No hydroureter. Right nephrolithiasis measuring up to 5 mm. No left nephrolithiasis. No ureterolithiasis bilaterally. The urinary bladder is unremarkable. On delayed imaging, there is no urothelial wall thickening and there are no filling defects in the opacified portions of the bilateral collecting systems or ureters. Stomach/Bowel: Stomach is within normal limits. No evidence of bowel wall thickening or dilatation. Colonic diverticulosis. Appendix appears normal. Vascular/Lymphatic: No abdominal aorta or iliac aneurysm. Moderate atherosclerotic plaque of the aorta and its branches. No abdominal, pelvic, or inguinal lymphadenopathy. Reproductive: The prostate is enlarged measuring up to 5 cm. Other: No intraperitoneal free fluid. No intraperitoneal free gas. No organized fluid collection. Musculoskeletal: Trace right fat containing inguinal hernia. Small fat containing left inguinal hernia. No suspicious lytic or blastic osseous lesions. No acute  displaced fracture. Please see separately dictated CT lumbar spine 03/31/2023. Review of the MIP images confirms the above findings. IMPRESSION: 1. No pulmonary embolus. 2. A 4.6 cm left thyroid gland nodule with prevascular space mediastinal soft tissue density and enlarged lymph nodes. Recommend thyroid US (ref: J Am Coll Radiol. 2015 Feb;12(2): 143-50). Given mediastinal findings, consider PET-CT following thyroid ultrasound. 3. Acute pancreatitis.  No pseudocyst formation. 4. Cholelithiasis with question associated gallbladder wall thickening. Consider right upper quadrant ultrasound for further evaluation. 5. Bilateral lower lobe atelectasis. Superimposed developing infection not fully excluded. Followup PA and lateral chest X-ray is recommended in 3-4 weeks following therapy to ensure resolution. 6. Other imaging findings of potential clinical significance: Tiny hiatal hernia. Hepatic steatosis and hepatomegaly. Nonobstructive right nephrolithiasis measuring up to 5 mm. Colonic diverticulosis with no acute diverticulitis. Prostatomegaly. Trace right fat containing inguinal hernia. Small fat containing left inguinal hernia. Aortic Atherosclerosis (ICD10-I70.0). 7. No acute intrathoracic, intra-abdominal, intrapelvic traumatic injury. 8. Please see separately dictated CT thoracolumbar spine 03/31/2023. Electronically Signed   By: Tish Frederickson M.D.   On: 03/31/2023 20:53   CT Abdomen Pelvis W Contrast  Result Date: 03/31/2023 CLINICAL DATA:  Pulmonary embolism (PE) suspected, low to intermediate prob, positive D-dimer; Abdominal pain, acute, nonlocalized elevated bili, hypotensive, fall 2 days ago EXAM: CT ANGIOGRAPHY CHEST CT ABDOMEN AND PELVIS WITH CONTRAST TECHNIQUE: Multidetector CT imaging of the chest was performed using the standard protocol during bolus administration of intravenous contrast. Multiplanar CT image reconstructions and MIPs were obtained to evaluate the vascular anatomy. Multidetector  CT imaging of the abdomen and pelvis was performed using the standard protocol during bolus administration of intravenous contrast. RADIATION DOSE REDUCTION: This exam was performed according to the departmental dose-optimization program which includes automated exposure control, adjustment of the mA and/or kV according to patient size and/or use of iterative reconstruction technique. CONTRAST:   OMNIPAQUE IOHEXOL 350 MG/ML SOLN COMPARISON:  None Available. FINDINGS: CTA CHEST FINDINGS Cardiovascular: Satisfactory opacification of the pulmonary arteries to the segmental level. No evidence of pulmonary embolism. Normal heart size. No significant pericardial effusion. The thoracic aorta is normal in caliber. Mild atherosclerotic plaque of the thoracic aorta. No coronary artery calcifications. Mediastinum/Nodes: Fat stranding and soft tissue density with enlarged lymph nodes along the low pre-vascular space (5: 66-106). Enlarged prevascular lymph node measuring 1.6 cm. No enlarged hilar or axillary lymph nodes. Trachea and esophagus esophagus demonstrate no significant findings. Tiny hiatal hernia. Enlarged left thyroid gland with a heterogeneous hypodense 4.2 x 4.6 cm thyroid gland nodule. Lungs/Pleura: Low lung volumes. Bilateral lower lobe atelectasis. No focal consolidation. No pulmonary nodule. No pulmonary mass. No pleural effusion. No pneumothorax. Musculoskeletal: No chest wall abnormality. No suspicious lytic or blastic osseous lesions. No acute displaced fracture. Please see separately dictated CT thoracic spine 03/31/2023. Review of the MIP images confirms the above findings. CT ABDOMEN and PELVIS FINDINGS Hepatobiliary: The liver is enlarged measuring up to 21.5 cm. The hepatic parenchyma is diffusely hypodense compared to the splenic parenchyma consistent with fatty infiltration. No focal liver abnormality. Calcified gallstones within the gallbladder lumen. Question gallbladder wall thickening. No  definite pericholecystic fluid. No biliary dilatation. Pancreas: Peripancreatic fat stranding. Slight haziness of the pancreatic contour proximally. The pancreas appears slightly diffusely atrophic. No main pancreatic ductal dilatation. Spleen: Normal in size without focal abnormality. Adrenals/Urinary Tract: No adrenal nodule bilaterally. Bilateral kidneys enhance symmetrically. No hydronephrosis. No hydroureter. Right nephrolithiasis measuring up to 5 mm. No left nephrolithiasis. No ureterolithiasis bilaterally. The urinary bladder is unremarkable. On delayed imaging, there is no urothelial wall thickening and there are no filling defects in the opacified portions of the bilateral collecting systems or ureters. Stomach/Bowel: Stomach is within normal limits. No evidence of bowel wall thickening or dilatation. Colonic diverticulosis. Appendix appears normal. Vascular/Lymphatic: No abdominal aorta or iliac aneurysm. Moderate atherosclerotic plaque of the aorta and its branches. No abdominal, pelvic, or inguinal lymphadenopathy. Reproductive: The prostate is enlarged measuring up to 5 cm. Other: No intraperitoneal free fluid. No intraperitoneal free gas. No organized fluid collection. Musculoskeletal: Trace right fat containing inguinal hernia. Small fat containing left inguinal hernia. No suspicious lytic or blastic osseous lesions. No acute displaced fracture. Please see separately dictated CT lumbar spine 03/31/2023. Review of the MIP images confirms the above findings. IMPRESSION: 1. No pulmonary embolus. 2. A 4.6 cm left thyroid gland nodule with prevascular space mediastinal soft tissue density and enlarged lymph nodes. Recommend thyroid US (ref: J Am Coll Radiol. 2015 Feb;12(2): 143-50). Given mediastinal findings, consider PET-CT following thyroid ultrasound. 3. Acute pancreatitis.  No pseudocyst formation. 4. Cholelithiasis with question associated gallbladder wall thickening. Consider right upper quadrant  ultrasound for further evaluation. 5. Bilateral lower lobe atelectasis. Superimposed developing infection not fully excluded. Followup PA and lateral chest X-ray is recommended in 3-4 weeks following therapy to ensure resolution. 6. Other imaging findings of potential clinical significance: Tiny hiatal hernia. Hepatic steatosis and hepatomegaly. Nonobstructive right nephrolithiasis measuring up to 5 mm. Colonic diverticulosis with no acute diverticulitis. Prostatomegaly. Trace right fat containing inguinal hernia. Small fat containing left inguinal hernia. Aortic Atherosclerosis (ICD10-I70.0). 7. No acute intrathoracic, intra-abdominal, intrapelvic traumatic injury. 8. Please see separately dictated CT thoracolumbar spine 03/31/2023. Electronically Signed   By: Tish Frederickson M.D.   On: 03/31/2023 20:53   DG Chest Port 1 View  Result Date: 03/31/2023 CLINICAL DATA:  fall EXAM: PORTABLE CHEST 1  VIEW COMPARISON:  Chest x-ray 01/30/2018 FINDINGS: The heart and mediastinal contours are unchanged. Bibasilar, right greater than left, patchy and streaky airspace opacities. No pulmonary edema. No pleural effusion. No pneumothorax. No acute osseous abnormality. IMPRESSION: Bibasilar, right greater than left, patchy and streaky airspace opacities. Finding likely combination of atelectasis versus infection/inflammation. Followup PA and lateral chest X-ray is recommended in 3-4 weeks following therapy to ensure resolution and exclude underlying malignancy. Electronically Signed   By: Tish Frederickson M.D.   On: 03/31/2023 17:19   DG Pelvis Portable  Result Date: 03/31/2023 CLINICAL DATA:  Trauma EXAM: PORTABLE PELVIS 1 VIEWS COMPARISON:  None Available. FINDINGS: Limited examination consisting of a single view of both hips demonstrates bilateral hip degenerative changes. This examination can not exclude fractures. No obvious displaced fractures are seen. Pelvic ring appears to be intact. IMPRESSION: Bilateral hip  degenerative changes. Evaluation for fractures was limited by lack of additional views. Electronically Signed   By: Layla Maw M.D.   On: 03/31/2023 17:19    Impression:   Elevated LFTs.  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   Imaging and labs consistent with pancreatitis.  Despite that, he has no abdominal pain and no clinical features to suggest pancreatitis. Gallstones. Alcohol use/abuse.  Plan:   Advance diet as tolerated. Alcohol cessation. Given lack of symptoms referable either to pancreatitis or biliary colic, notwithstanding the patient's higher operative risk, I agree with surgery that we should not consider cholecystectomy at this time. Follow LFTs and PT/INR.  Unless dramatic uptrend, would not pursue steroids at this time. [facility] will follow.   LOS: 2 days   Oakley Orban M  04/02/2023, 12:55 PM  Cell [phone] If no answer or after 5 PM call [phone]

## 2023-04-02 NOTE — Consult Note (Signed)
Reason for Consult:Pancreatitis/ gallstones Referring Physician: [name] is an 62 y.o. male.  HPI: This is a 62 year old male with hypertension and alcohol abuse who was admitted 03/31/23 with weakness and a fall.  On admission, he was noted to have significantly abnormal LFT's with a total bilirubin of 7.5.  No INR was checked.  Lipase was 368 on 4/12.  CT scan showed signs of acute pancreatitis, as well as cholelithiasis with possible gallbladder wall thickening.  Hepatic steatosis and hepatomegaly.  [place] confirmed gallstones and wall thickeness 5 mm.  MRCP showed no signs of choledocholithiasis or acute cholecystitis.  Changes consistent with cirrhosis.    The patient admits to drinking at least 3 glasses of "dark liquor" daily.  He has had several falls recently, but has never been diagnosed with cirrhosis or liver failure.  He has had no abdominal pain, nausea, or vomiting this admission.   Past Medical History:  Diagnosis Date   Hypertension     No past surgical history on file.  No family history on file.  Social History: Etoh abuse  Allergies: No Known Allergies  Medications:  Prior to Admission medications   Medication Sig Start Date End Date Taking? Authorizing Provider  acetaminophen (TYLENOL) 650 MG CR tablet Take 650 mg by mouth every 8 (eight) hours as needed for pain.    [provider]  hydrochlorothiazide (HYDRODIURIL) 25 MG tablet Take 1 tablet (25 mg total) by mouth daily. 01/30/18 03/01/18  Nira Conn, MD  Multiple Vitamins-Minerals (MULTIVITAMIN WITH MINERALS) tablet Take 1 tablet by mouth daily.    [provider]  Omega-3 Fatty Acids (FISH OIL) 1000 MG CAPS Take 2,000 mg by mouth daily.    [provider]     Results for orders placed or performed during the hospital encounter of 03/31/23 (from the past 48 hour(s))  CBC with Differential     Status: Abnormal   Collection Time: 03/31/23  4:33 PM  Result Value  Ref Range   WBC 5.0 4.0 - 10.5 K/uL   RBC 4.11 (L) 4.22 - 5.81 MIL/uL   Hemoglobin 13.5 13.0 - 17.0 g/dL   HCT 29.7 (L) 98.9 - 21.1 %   MCV 94.6 80.0 - 100.0 fL   MCH 32.8 26.0 - 34.0 pg   MCHC 34.7 30.0 - 36.0 g/dL   RDW 94.1 74.0 - 81.4 %   Platelets 282 150 - 400 K/uL   nRBC 0.0 0.0 - 0.2 %   Neutrophils Relative % 80 %   Neutro Abs 3.9 1.7 - 7.7 K/uL   Lymphocytes Relative 12 %   Lymphs Abs 0.6 (L) 0.7 - 4.0 K/uL   Monocytes Relative 6 %   Monocytes Absolute 0.3 0.1 - 1.0 K/uL   Eosinophils Relative 0 %   Eosinophils Absolute 0.0 0.0 - 0.5 K/uL   Basophils Relative 1 %   Basophils Absolute 0.1 0.0 - 0.1 K/uL   Immature Granulocytes 1 %   Abs Immature Granulocytes 0.03 0.00 - 0.07 K/uL    Comment: Performed at [facility], [address] [place], [place] [address]  Comprehensive metabolic panel     Status: Abnormal   Collection Time: 03/31/23  4:33 PM  Result Value Ref Range   Sodium 137 135 - 145 mmol/L   Potassium 3.0 (L) 3.5 - 5.1 mmol/L   Chloride 100 98 - 111 mmol/L   CO2 19 (L) 22 - 32 mmol/L   Glucose, Bld 83  70 - 99 mg/dL    Comment: Glucose reference range applies only to samples taken after fasting for at least 8 hours.   BUN 8 6 - 20 mg/dL   Creatinine, Ser 1.61 0.61 - 1.24 mg/dL   Calcium 7.9 (L) 8.9 - 10.3 mg/dL   Total Protein 7.9 6.5 - 8.1 g/dL   Albumin 2.7 (L) 3.5 - 5.0 g/dL   AST 096 (H) 15 - 41 U/L   ALT 53 (H) 0 - 44 U/L   Alkaline Phosphatase 124 38 - 126 U/L   Total Bilirubin 7.5 (H) 0.3 - 1.2 mg/dL   GFR, Estimated >04 >54 mL/min    Comment: (NOTE) Calculated using the CKD-EPI Creatinine Equation (2021)    Anion gap 18 (H) 5 - 15    Comment: Performed at [facility], [address] [place], [place] [address]  Lipase, blood     Status: Abnormal   Collection Time: 03/31/23  4:33 PM  Result Value Ref Range   Lipase 368 (H) 11 - 51 U/L    Comment: Performed at [facility], [address] [place], [place] [address]  HIV Antibody (routine testing w rflx)     Status: None   Collection Time: 04/01/23  4:58 AM  Result Value Ref Range   HIV Screen 4th Generation wRfx Non Reactive Non Reactive    Comment: Performed at [facility], [address], [place], [place] [address]  Comprehensive metabolic panel     Status: Abnormal   Collection Time: 04/01/23  4:58 AM  Result Value Ref Range   Sodium 137 135 - 145 mmol/L   Potassium 3.1 (L) 3.5 - 5.1 mmol/L   Chloride 103 98 - 111 mmol/L   CO2 20 (L) 22 - 32 mmol/L   Glucose, Bld 65 (L) 70 - 99 mg/dL    Comment: Glucose reference range applies only to samples taken after fasting for at least 8 hours.   BUN 9 6 - 20 mg/dL   Creatinine, Ser 1.30 (L) 0.61 - 1.24 mg/dL   Calcium 7.5 (L) 8.9 - 10.3 mg/dL   Total Protein 7.4 6.5 - 8.1 g/dL   Albumin 2.7 (L) 3.5 - 5.0 g/dL   AST 865 (H) 15 - 41 U/L   ALT 53 (H) 0 - 44 U/L   Alkaline Phosphatase 124 38 - 126 U/L   Total Bilirubin 7.2 (H) 0.3 - 1.2 mg/dL   GFR, Estimated >78 >46 mL/min    Comment: (NOTE) Calculated using the CKD-EPI Creatinine Equation (2021)    Anion gap 14 5 - 15    Comment: Performed at [facility], [address], [place], [place] [address]  CBC     Status: Abnormal   Collection Time: 04/01/23  4:58 AM  Result Value Ref Range   WBC 6.2 4.0 - 10.5 K/uL   RBC 3.93 (L) 4.22 - 5.81 MIL/uL   Hemoglobin 13.1 13.0 - 17.0 g/dL   HCT 28.4 (L) 13.2 - 44.0 %   MCV 96.7 80.0 - 100.0 fL   MCH 33.3 26.0 - 34.0 pg   MCHC 34.5 30.0 - 36.0 g/dL   RDW 10.2 72.5 - 36.6 %   Platelets 253 150 - 400 K/uL   nRBC 0.0 0.0 - 0.2 %    Comment: Performed at [facility], [address], [place], [place] [address]  Magnesium     Status: None   Collection Time: 04/01/23  4:58 AM  Result Value Ref Range   Magnesium 1.8 1.7 - 2.4 mg/dL    Comment: Performed at [facility], [address], [place], [place]  [address]  Phosphorus     Status: Abnormal   Collection Time: 04/01/23  4:58 AM  Result Value Ref Range   Phosphorus 4.7 (H) 2.5 - 4.6 mg/dL    Comment: Performed at [facility], [address], [place], [place] [address]  Comprehensive metabolic panel     Status: Abnormal   Collection Time: 04/01/23  3:12 PM  Result Value Ref Range   Sodium 140 135 - 145 mmol/L   Potassium 3.1 (L) 3.5 - 5.1 mmol/L   Chloride 105 98 - 111 mmol/L   CO2 21 (L) 22 - 32 mmol/L   Glucose, Bld 111 (H) 70 - 99 mg/dL    Comment: Glucose reference range applies only to samples taken after fasting for at least 8 hours.   BUN 9 6 - 20 mg/dL   Creatinine, Ser 0.98 (L) 0.61 - 1.24 mg/dL   Calcium 8.0 (L) 8.9 - 10.3 mg/dL   Total Protein 7.7 6.5 - 8.1 g/dL   Albumin 2.7 (L) 3.5 - 5.0 g/dL   AST 119 (H) 15 - 41 U/L   ALT 53 (H) 0 - 44 U/L   Alkaline Phosphatase 126 38 - 126 U/L   Total Bilirubin 7.6 (H) 0.3 - 1.2 mg/dL   GFR, Estimated >14 >78 mL/min    Comment: (NOTE) Calculated using the CKD-EPI Creatinine Equation (2021)    Anion gap 14 5 - 15    Comment: Performed at [facility], [address], [place], [place] [address]  TSH     Status: None   Collection Time: 04/02/23  5:26 AM  Result Value Ref Range   TSH 2.355 0.350 - 4.500 uIU/mL    Comment: Performed by a 3rd Generation assay with a functional sensitivity of <=0.01 uIU/mL. Performed at [facility], [address], [place], [place] [address]   Comprehensive metabolic panel     Status: Abnormal   Collection Time: 04/02/23  5:26 AM  Result Value Ref Range   Sodium 137 135 - 145 mmol/L   Potassium 2.8 (L) 3.5 - 5.1 mmol/L   Chloride 102 98 - 111 mmol/L   CO2 25 22 - 32 mmol/L   Glucose, Bld 132 (H) 70 - 99 mg/dL    Comment: Glucose reference range applies only to samples taken after fasting for at least 8 hours.   BUN 6 6 - 20 mg/dL   Creatinine, Ser 5.78 (L) 0.61 - 1.24 mg/dL   Calcium  8.0 (L) 8.9 - 10.3 mg/dL   Total Protein 7.1 6.5 - 8.1 g/dL   Albumin 2.5 (L) 3.5 - 5.0 g/dL   AST 469 (H) 15 - 41 U/L   ALT 47 (H) 0 - 44 U/L   Alkaline Phosphatase 111 38 - 126 U/L   Total Bilirubin 7.7 (H) 0.3 - 1.2 mg/dL   GFR, Estimated >62 >95 mL/min    Comment: (NOTE) Calculated using the CKD-EPI Creatinine Equation (2021)    Anion gap 10 5 - 15    Comment: Performed at [facility], [address], [place], [place] [address]  CBC     Status: Abnormal   Collection Time: 04/02/23  5:26 AM  Result Value Ref Range   WBC 3.9 (L) 4.0 - 10.5 K/uL   RBC 3.54 (  L) 4.22 - 5.81 MIL/uL   Hemoglobin 11.5 (L) 13.0 - 17.0 g/dL   HCT 09.8 (L) 11.9 - 14.7 %   MCV 96.3 80.0 - 100.0 fL   MCH 32.5 26.0 - 34.0 pg   MCHC 33.7 30.0 - 36.0 g/dL   RDW 82.9 56.2 - 13.0 %   Platelets 161 150 - 400 K/uL   nRBC 0.0 0.0 - 0.2 %    Comment: Performed at [facility], [address] [place], [place] [address]    MR ABDOMEN MRCP W WO CONTAST  Result Date: 04/01/2023 CLINICAL DATA:  Pancreatitis.  History of gallstones. EXAM: MRI ABDOMEN WITHOUT AND WITH CONTRAST (INCLUDING MRCP) TECHNIQUE: Multiplanar multisequence MR imaging of the abdomen was performed both before and after the administration of intravenous contrast. Heavily T2-weighted images of the biliary and pancreatic ducts were obtained, and three-dimensional MRCP images were rendered by post processing. CONTRAST:  10mL GADAVIST GADOBUTROL 1 MMOL/ML IV SOLN COMPARISON:  CT AP 03/31/2023 FINDINGS: Lower chest: No acute abnormality. Hepatobiliary: Marked hepatic steatosis. Hypertrophy of the caudate lobe and lateral segment of left hepatic lobe identified. Contour the liver is slightly nodular. Heterogeneous enhancement of the liver parenchyma is identified. No focal enhancing liver lesions identified. The gallbladder is filled with multiple large stones. The largest is in the gallbladder neck measuring 1.3 cm. There  is mild gallbladder wall thickening and edema measuring up to 5 mm, image 34/5. No intrahepatic or common bile duct dilatation. No signs of choledocholithiasis. Pancreas: Changes of acute pancreatitis are again identified with peripancreatic edema. Fluid is seen extending from the pancreas into the retroperitoneum bilaterally. No fluid collections identified to suggest pseudocyst. At the level of the tail the main duct is upper limits of normal measuring 3 mm. No mass identified. No signs of pancreatic necrosis. Spleen:  Within normal limits in size and appearance. Adrenals/Urinary Tract: No masses identified. No evidence of hydronephrosis. Stomach/Bowel: Small hiatal hernia. Stomach is nondistended. No bowel wall thickening or distension identified. Vascular/Lymphatic: Normal caliber abdominal aorta. Upper abdominal vascularity including the portal vein is patent. Portacaval lymph node measures 1.5 cm, image 49/5. Porta hepatic lymph node measures 1 cm, image 43/5. Multiple prominent left retroperitoneal and gastrohepatic ligament lymph nodes are also noted. Other: No discrete fluid collections identified. Mild upper abdominal scratch set trace perihepatic and perisplenic fluid. Musculoskeletal: No enhancing bone lesions identified. IMPRESSION: 1. Acute pancreatitis. No evidence for pancreatic necrosis or pseudocyst. 2. Gallstones with mild gallbladder wall thickening and edema. No signs of choledocholithiasis. In the setting of acute pancreatitis gallbladder wall edema. Gallbladder wall edema may also be seen in the setting of cirrhosis 3. No significant bile duct dilatation or signs of choledocholithiasis. 4. Marked hepatic steatosis with morphologic features of the liver concerning for cirrhosis. 5. Small hiatal hernia. 6. Increased size of upper abdominal lymph nodes. Nonspecific in the setting of cirrhosis as well as pancreatitis. Electronically Signed   By: Signa Kell M.D.   On: 04/01/2023 14:14   MR 3D  Recon At Scanner  Result Date: 04/01/2023 CLINICAL DATA:  Pancreatitis.  History of gallstones. EXAM: MRI ABDOMEN WITHOUT AND WITH CONTRAST (INCLUDING MRCP) TECHNIQUE: Multiplanar multisequence MR imaging of the abdomen was performed both before and after the administration of intravenous contrast. Heavily T2-weighted images of the biliary and pancreatic ducts were obtained, and three-dimensional MRCP images were rendered by post processing. CONTRAST:  10mL GADAVIST GADOBUTROL 1 MMOL/ML IV SOLN COMPARISON:  CT AP 03/31/2023 FINDINGS: Lower chest: No  acute abnormality. Hepatobiliary: Marked hepatic steatosis. Hypertrophy of the caudate lobe and lateral segment of left hepatic lobe identified. Contour the liver is slightly nodular. Heterogeneous enhancement of the liver parenchyma is identified. No focal enhancing liver lesions identified. The gallbladder is filled with multiple large stones. The largest is in the gallbladder neck measuring 1.3 cm. There is mild gallbladder wall thickening and edema measuring up to 5 mm, image 34/5. No intrahepatic or common bile duct dilatation. No signs of choledocholithiasis. Pancreas: Changes of acute pancreatitis are again identified with peripancreatic edema. Fluid is seen extending from the pancreas into the retroperitoneum bilaterally. No fluid collections identified to suggest pseudocyst. At the level of the tail the main duct is upper limits of normal measuring 3 mm. No mass identified. No signs of pancreatic necrosis. Spleen:  Within normal limits in size and appearance. Adrenals/Urinary Tract: No masses identified. No evidence of hydronephrosis. Stomach/Bowel: Small hiatal hernia. Stomach is nondistended. No bowel wall thickening or distension identified. Vascular/Lymphatic: Normal caliber abdominal aorta. Upper abdominal vascularity including the portal vein is patent. Portacaval lymph node measures 1.5 cm, image 49/5. Porta hepatic lymph node measures 1 cm, image 43/5.  Multiple prominent left retroperitoneal and gastrohepatic ligament lymph nodes are also noted. Other: No discrete fluid collections identified. Mild upper abdominal scratch set trace perihepatic and perisplenic fluid. Musculoskeletal: No enhancing bone lesions identified. IMPRESSION: 1. Acute pancreatitis. No evidence for pancreatic necrosis or pseudocyst. 2. Gallstones with mild gallbladder wall thickening and edema. No signs of choledocholithiasis. In the setting of acute pancreatitis gallbladder wall edema. Gallbladder wall edema may also be seen in the setting of cirrhosis 3. No significant bile duct dilatation or signs of choledocholithiasis. 4. Marked hepatic steatosis with morphologic features of the liver concerning for cirrhosis. 5. Small hiatal hernia. 6. Increased size of upper abdominal lymph nodes. Nonspecific in the setting of cirrhosis as well as pancreatitis. Electronically Signed   By: Signa Kell M.D.   On: 04/01/2023 14:14   US THYROID  Result Date: 04/01/2023 CLINICAL DATA:  Thyroid nodule seen on CT angiography of the chest EXAM: THYROID ULTRASOUND TECHNIQUE: Ultrasound examination of the thyroid gland and adjacent soft tissues was performed. COMPARISON:  CT angiography chest 03/31/2023 FINDINGS: Parenchymal Echotexture: Mildly heterogenous Isthmus: 0.3 cm Right lobe: 3.0 x 1.6 x 1.3 cm Left lobe: 7.5 x 3.6 x 4.6 cm _________________________________________________________ Estimated total number of nodules >/= 1 cm: 1 Number of spongiform nodules >/=  2 cm not described below (TR1): 0 Number of mixed cystic and solid nodules >/= 1.5 cm not described below (TR2): 0 _________________________________________________________ Nodule # 1: Location: Left; inferior Maximum size: 4.9 cm; Other 2 dimensions: 4.6 x 4.7 cm Composition: solid/almost completely solid (2) Echogenicity: isoechoic (1) Shape: not taller-than-wide (0) Margins: smooth (0) Echogenic foci: none (0) ACR TI-RADS total points: 3.  ACR TI-RADS risk category: TR3 (3 points). ACR TI-RADS recommendations: **Given size (>/= 2.5 cm) and appearance, fine needle aspiration of this mildly suspicious nodule should be considered based on TI-RADS criteria. This nodule corresponds to the abnormality seen on chest CT _________________________________________________________ IMPRESSION: Nodule 1 (TI-RADS 3), measuring 4.9 cm, located in the inferior left thyroid lobe, meets criteria for FNA. The above is in keeping with the ACR TI-RADS recommendations - J Am Coll Radiol 2017;14:587-595. Electronically Signed   By: Acquanetta Belling M.D.   On: 04/01/2023 06:08   US Abdomen Limited RUQ (LIVER/GB)  Result Date: 03/31/2023 CLINICAL DATA:  Epigastric pain. EXAM: ULTRASOUND ABDOMEN LIMITED RIGHT UPPER  QUADRANT COMPARISON:  CT earlier today FINDINGS: Gallbladder: Multiple shadowing gallstones obscuring the posterior gallbladder wall. Likely gallbladder sludge. There is mild gallbladder wall thickening at 4-5 mm. No pericholecystic fluid. No sonographic Murphy sign noted by sonographer. Common bile duct: Diameter: 4-5 mm, nondilated. Liver: Heterogeneous and diffusely increased in parenchymal echogenicity. The liver parenchyma is difficult to penetrate. No evidence of focal lesion. No convincing capsular nodularity. Portal vein is patent on color Doppler imaging with normal direction of blood flow towards the liver. Other: No right upper quadrant ascites. IMPRESSION: 1. Gallbladder filled with gallstones. Gallbladder wall thickening of 4-5 mm, nonspecific. Negative sonographic Murphy sign. 2. No biliary dilatation. 3. Hepatic steatosis. Electronically Signed   By: Narda Rutherford M.D.   On: 03/31/2023 21:59   CT L-SPINE NO CHARGE  Result Date: 03/31/2023 CLINICAL DATA:  Polytrauma, blunt.  Fall.  Diffuse abdominal pain. EXAM: CT CERVICAL, THORACIC, AND LUMBAR SPINE WITHOUT CONTRAST TECHNIQUE: Multidetector CT imaging of the cervical, thoracic and lumbar spine  was performed without intravenous contrast. Multiplanar CT image reconstructions were also generated. RADIATION DOSE REDUCTION: This exam was performed according to the departmental dose-optimization program which includes automated exposure control, adjustment of the mA and/or kV according to patient size and/or use of iterative reconstruction technique. COMPARISON:  None Available. FINDINGS: CT CERVICAL SPINE FINDINGS Alignment: Normal. Skull base and vertebrae: Multilevel moderate degenerative changes spine. No associated severe osseous neural foraminal central canal stenosis. No acute fracture. No aggressive appearing focal osseous lesion or focal pathologic process. Soft tissues and spinal canal: No prevertebral fluid or swelling. No visible canal hematoma. Upper chest: Unremarkable. Other: Left thyroid gland nodule-please see separately dictated CT chest 03/31/2023. CT THORACIC SPINE FINDINGS Alignment: Normal. Vertebrae: Severe contiguous osteophyte formation. Multilevel mild facet arthropathy. Posterior longitudinal ligament calcification. No acute fracture or focal pathologic process. Paraspinal and other soft tissues: Negative. Disc levels: Maintained. CT LUMBAR SPINE FINDINGS Segmentation: 5 lumbar type vertebrae. Alignment: Normal. Vertebrae: Multilevel moderate degenerative changes spine with posterior disc osteophyte complex formation at the L2-L3, L3-L4, L4-L5, L5-S1 levels. No associated severe osseous neural foraminal or central canal stenosis. No acute fracture or focal pathologic process. Paraspinal and other soft tissues: Negative. Disc levels: Maintained. Other: Moderate degenerative changes of the right hip. Atherosclerotic plaque. IMPRESSION: 1. No acute displaced fracture or traumatic listhesis of the cervical, thoracic, lumbar spine. 2. Thoracic findings suggestive of DISH. 3. Left thyroid gland nodule-please see separately dictated CT chest 03/31/2023. 4.  Aortic Atherosclerosis  (ICD10-I70.0). Electronically Signed   By: Tish Frederickson M.D.   On: 03/31/2023 21:20   CT T-SPINE NO CHARGE  Result Date: 03/31/2023 CLINICAL DATA:  Polytrauma, blunt.  Fall.  Diffuse abdominal pain. EXAM: CT CERVICAL, THORACIC, AND LUMBAR SPINE WITHOUT CONTRAST TECHNIQUE: Multidetector CT imaging of the cervical, thoracic and lumbar spine was performed without intravenous contrast. Multiplanar CT image reconstructions were also generated. RADIATION DOSE REDUCTION: This exam was performed according to the departmental dose-optimization program which includes automated exposure control, adjustment of the mA and/or kV according to patient size and/or use of iterative reconstruction technique. COMPARISON:  None Available. FINDINGS: CT CERVICAL SPINE FINDINGS Alignment: Normal. Skull base and vertebrae: Multilevel moderate degenerative changes spine. No associated severe osseous neural foraminal central canal stenosis. No acute fracture. No aggressive appearing focal osseous lesion or focal pathologic process. Soft tissues and spinal canal: No prevertebral fluid or swelling. No visible canal hematoma. Upper chest: Unremarkable. Other: Left thyroid gland nodule-please see separately dictated CT chest 03/31/2023. CT THORACIC  SPINE FINDINGS Alignment: Normal. Vertebrae: Severe contiguous osteophyte formation. Multilevel mild facet arthropathy. Posterior longitudinal ligament calcification. No acute fracture or focal pathologic process. Paraspinal and other soft tissues: Negative. Disc levels: Maintained. CT LUMBAR SPINE FINDINGS Segmentation: 5 lumbar type vertebrae. Alignment: Normal. Vertebrae: Multilevel moderate degenerative changes spine with posterior disc osteophyte complex formation at the L2-L3, L3-L4, L4-L5, L5-S1 levels. No associated severe osseous neural foraminal or central canal stenosis. No acute fracture or focal pathologic process. Paraspinal and other soft tissues: Negative. Disc levels:  Maintained. Other: Moderate degenerative changes of the right hip. Atherosclerotic plaque. IMPRESSION: 1. No acute displaced fracture or traumatic listhesis of the cervical, thoracic, lumbar spine. 2. Thoracic findings suggestive of DISH. 3. Left thyroid gland nodule-please see separately dictated CT chest 03/31/2023. 4.  Aortic Atherosclerosis (ICD10-I70.0). Electronically Signed   By: Tish Frederickson M.D.   On: 03/31/2023 21:20   CT Cervical Spine Wo Contrast  Result Date: 03/31/2023 CLINICAL DATA:  Polytrauma, blunt.  Fall.  Diffuse abdominal pain. EXAM: CT CERVICAL, THORACIC, AND LUMBAR SPINE WITHOUT CONTRAST TECHNIQUE: Multidetector CT imaging of the cervical, thoracic and lumbar spine was performed without intravenous contrast. Multiplanar CT image reconstructions were also generated. RADIATION DOSE REDUCTION: This exam was performed according to the departmental dose-optimization program which includes automated exposure control, adjustment of the mA and/or kV according to patient size and/or use of iterative reconstruction technique. COMPARISON:  None Available. FINDINGS: CT CERVICAL SPINE FINDINGS Alignment: Normal. Skull base and vertebrae: Multilevel moderate degenerative changes spine. No associated severe osseous neural foraminal central canal stenosis. No acute fracture. No aggressive appearing focal osseous lesion or focal pathologic process. Soft tissues and spinal canal: No prevertebral fluid or swelling. No visible canal hematoma. Upper chest: Unremarkable. Other: Left thyroid gland nodule-please see separately dictated CT chest 03/31/2023. CT THORACIC SPINE FINDINGS Alignment: Normal. Vertebrae: Severe contiguous osteophyte formation. Multilevel mild facet arthropathy. Posterior longitudinal ligament calcification. No acute fracture or focal pathologic process. Paraspinal and other soft tissues: Negative. Disc levels: Maintained. CT LUMBAR SPINE FINDINGS Segmentation: 5 lumbar type vertebrae.  Alignment: Normal. Vertebrae: Multilevel moderate degenerative changes spine with posterior disc osteophyte complex formation at the L2-L3, L3-L4, L4-L5, L5-S1 levels. No associated severe osseous neural foraminal or central canal stenosis. No acute fracture or focal pathologic process. Paraspinal and other soft tissues: Negative. Disc levels: Maintained. Other: Moderate degenerative changes of the right hip. Atherosclerotic plaque. IMPRESSION: 1. No acute displaced fracture or traumatic listhesis of the cervical, thoracic, lumbar spine. 2. Thoracic findings suggestive of DISH. 3. Left thyroid gland nodule-please see separately dictated CT chest 03/31/2023. 4.  Aortic Atherosclerosis (ICD10-I70.0). Electronically Signed   By: Tish Frederickson M.D.   On: 03/31/2023 21:20   CT Angio Chest PE W/Cm &/Or Wo Cm  Result Date: 03/31/2023 CLINICAL DATA:  Pulmonary embolism (PE) suspected, low to intermediate prob, positive D-dimer; Abdominal pain, acute, nonlocalized elevated bili, hypotensive, fall 2 days ago EXAM: CT ANGIOGRAPHY CHEST CT ABDOMEN AND PELVIS WITH CONTRAST TECHNIQUE: Multidetector CT imaging of the chest was performed using the standard protocol during bolus administration of intravenous contrast. Multiplanar CT image reconstructions and MIPs were obtained to evaluate the vascular anatomy. Multidetector CT imaging of the abdomen and pelvis was performed using the standard protocol during bolus administration of intravenous contrast. RADIATION DOSE REDUCTION: This exam was performed according to the departmental dose-optimization program which includes automated exposure control, adjustment of the mA and/or kV according to patient size and/or use of iterative reconstruction technique. CONTRAST:  OMNIPAQUE  IOHEXOL 350 MG/ML SOLN COMPARISON:  None Available. FINDINGS: CTA CHEST FINDINGS Cardiovascular: Satisfactory opacification of the pulmonary arteries to the segmental level. No evidence of pulmonary  embolism. Normal heart size. No significant pericardial effusion. The thoracic aorta is normal in caliber. Mild atherosclerotic plaque of the thoracic aorta. No coronary artery calcifications. Mediastinum/Nodes: Fat stranding and soft tissue density with enlarged lymph nodes along the low pre-vascular space (5: 66-106). Enlarged prevascular lymph node measuring 1.6 cm. No enlarged hilar or axillary lymph nodes. Trachea and esophagus esophagus demonstrate no significant findings. Tiny hiatal hernia. Enlarged left thyroid gland with a heterogeneous hypodense 4.2 x 4.6 cm thyroid gland nodule. Lungs/Pleura: Low lung volumes. Bilateral lower lobe atelectasis. No focal consolidation. No pulmonary nodule. No pulmonary mass. No pleural effusion. No pneumothorax. Musculoskeletal: No chest wall abnormality. No suspicious lytic or blastic osseous lesions. No acute displaced fracture. Please see separately dictated CT thoracic spine 03/31/2023. Review of the MIP images confirms the above findings. CT ABDOMEN and PELVIS FINDINGS Hepatobiliary: The liver is enlarged measuring up to 21.5 cm. The hepatic parenchyma is diffusely hypodense compared to the splenic parenchyma consistent with fatty infiltration. No focal liver abnormality. Calcified gallstones within the gallbladder lumen. Question gallbladder wall thickening. No definite pericholecystic fluid. No biliary dilatation. Pancreas: Peripancreatic fat stranding. Slight haziness of the pancreatic contour proximally. The pancreas appears slightly diffusely atrophic. No main pancreatic ductal dilatation. Spleen: Normal in size without focal abnormality. Adrenals/Urinary Tract: No adrenal nodule bilaterally. Bilateral kidneys enhance symmetrically. No hydronephrosis. No hydroureter. Right nephrolithiasis measuring up to 5 mm. No left nephrolithiasis. No ureterolithiasis bilaterally. The urinary bladder is unremarkable. On delayed imaging, there is no urothelial wall thickening  and there are no filling defects in the opacified portions of the bilateral collecting systems or ureters. Stomach/Bowel: Stomach is within normal limits. No evidence of bowel wall thickening or dilatation. Colonic diverticulosis. Appendix appears normal. Vascular/Lymphatic: No abdominal aorta or iliac aneurysm. Moderate atherosclerotic plaque of the aorta and its branches. No abdominal, pelvic, or inguinal lymphadenopathy. Reproductive: The prostate is enlarged measuring up to 5 cm. Other: No intraperitoneal free fluid. No intraperitoneal free gas. No organized fluid collection. Musculoskeletal: Trace right fat containing inguinal hernia. Small fat containing left inguinal hernia. No suspicious lytic or blastic osseous lesions. No acute displaced fracture. Please see separately dictated CT lumbar spine 03/31/2023. Review of the MIP images confirms the above findings. IMPRESSION: 1. No pulmonary embolus. 2. A 4.6 cm left thyroid gland nodule with prevascular space mediastinal soft tissue density and enlarged lymph nodes. Recommend thyroid US (ref: J Am Coll Radiol. 2015 Feb;12(2): 143-50). Given mediastinal findings, consider PET-CT following thyroid ultrasound. 3. Acute pancreatitis.  No pseudocyst formation. 4. Cholelithiasis with question associated gallbladder wall thickening. Consider right upper quadrant ultrasound for further evaluation. 5. Bilateral lower lobe atelectasis. Superimposed developing infection not fully excluded. Followup PA and lateral chest X-ray is recommended in 3-4 weeks following therapy to ensure resolution. 6. Other imaging findings of potential clinical significance: Tiny hiatal hernia. Hepatic steatosis and hepatomegaly. Nonobstructive right nephrolithiasis measuring up to 5 mm. Colonic diverticulosis with no acute diverticulitis. Prostatomegaly. Trace right fat containing inguinal hernia. Small fat containing left inguinal hernia. Aortic Atherosclerosis (ICD10-I70.0). 7. No acute  intrathoracic, intra-abdominal, intrapelvic traumatic injury. 8. Please see separately dictated CT thoracolumbar spine 03/31/2023. Electronically Signed   By: Tish Frederickson M.D.   On: 03/31/2023 20:53   CT Abdomen Pelvis W Contrast  Result Date: 03/31/2023 CLINICAL DATA:  Pulmonary embolism (PE) suspected, low  to intermediate prob, positive D-dimer; Abdominal pain, acute, nonlocalized elevated bili, hypotensive, fall 2 days ago EXAM: CT ANGIOGRAPHY CHEST CT ABDOMEN AND PELVIS WITH CONTRAST TECHNIQUE: Multidetector CT imaging of the chest was performed using the standard protocol during bolus administration of intravenous contrast. Multiplanar CT image reconstructions and MIPs were obtained to evaluate the vascular anatomy. Multidetector CT imaging of the abdomen and pelvis was performed using the standard protocol during bolus administration of intravenous contrast. RADIATION DOSE REDUCTION: This exam was performed according to the departmental dose-optimization program which includes automated exposure control, adjustment of the mA and/or kV according to patient size and/or use of iterative reconstruction technique. CONTRAST:  OMNIPAQUE IOHEXOL 350 MG/ML SOLN COMPARISON:  None Available. FINDINGS: CTA CHEST FINDINGS Cardiovascular: Satisfactory opacification of the pulmonary arteries to the segmental level. No evidence of pulmonary embolism. Normal heart size. No significant pericardial effusion. The thoracic aorta is normal in caliber. Mild atherosclerotic plaque of the thoracic aorta. No coronary artery calcifications. Mediastinum/Nodes: Fat stranding and soft tissue density with enlarged lymph nodes along the low pre-vascular space (5: 66-106). Enlarged prevascular lymph node measuring 1.6 cm. No enlarged hilar or axillary lymph nodes. Trachea and esophagus esophagus demonstrate no significant findings. Tiny hiatal hernia. Enlarged left thyroid gland with a heterogeneous hypodense 4.2 x 4.6 cm  thyroid gland nodule. Lungs/Pleura: Low lung volumes. Bilateral lower lobe atelectasis. No focal consolidation. No pulmonary nodule. No pulmonary mass. No pleural effusion. No pneumothorax. Musculoskeletal: No chest wall abnormality. No suspicious lytic or blastic osseous lesions. No acute displaced fracture. Please see separately dictated CT thoracic spine 03/31/2023. Review of the MIP images confirms the above findings. CT ABDOMEN and PELVIS FINDINGS Hepatobiliary: The liver is enlarged measuring up to 21.5 cm. The hepatic parenchyma is diffusely hypodense compared to the splenic parenchyma consistent with fatty infiltration. No focal liver abnormality. Calcified gallstones within the gallbladder lumen. Question gallbladder wall thickening. No definite pericholecystic fluid. No biliary dilatation. Pancreas: Peripancreatic fat stranding. Slight haziness of the pancreatic contour proximally. The pancreas appears slightly diffusely atrophic. No main pancreatic ductal dilatation. Spleen: Normal in size without focal abnormality. Adrenals/Urinary Tract: No adrenal nodule bilaterally. Bilateral kidneys enhance symmetrically. No hydronephrosis. No hydroureter. Right nephrolithiasis measuring up to 5 mm. No left nephrolithiasis. No ureterolithiasis bilaterally. The urinary bladder is unremarkable. On delayed imaging, there is no urothelial wall thickening and there are no filling defects in the opacified portions of the bilateral collecting systems or ureters. Stomach/Bowel: Stomach is within normal limits. No evidence of bowel wall thickening or dilatation. Colonic diverticulosis. Appendix appears normal. Vascular/Lymphatic: No abdominal aorta or iliac aneurysm. Moderate atherosclerotic plaque of the aorta and its branches. No abdominal, pelvic, or inguinal lymphadenopathy. Reproductive: The prostate is enlarged measuring up to 5 cm. Other: No intraperitoneal free fluid. No intraperitoneal free gas. No organized fluid  collection. Musculoskeletal: Trace right fat containing inguinal hernia. Small fat containing left inguinal hernia. No suspicious lytic or blastic osseous lesions. No acute displaced fracture. Please see separately dictated CT lumbar spine 03/31/2023. Review of the MIP images confirms the above findings. IMPRESSION: 1. No pulmonary embolus. 2. A 4.6 cm left thyroid gland nodule with prevascular space mediastinal soft tissue density and enlarged lymph nodes. Recommend thyroid US (ref: J Am Coll Radiol. 2015 Feb;12(2): 143-50). Given mediastinal findings, consider PET-CT following thyroid ultrasound. 3. Acute pancreatitis.  No pseudocyst formation. 4. Cholelithiasis with question associated gallbladder wall thickening. Consider right upper quadrant ultrasound for further evaluation. 5. Bilateral lower lobe atelectasis. Superimposed developing  infection not fully excluded. Followup PA and lateral chest X-ray is recommended in 3-4 weeks following therapy to ensure resolution. 6. Other imaging findings of potential clinical significance: Tiny hiatal hernia. Hepatic steatosis and hepatomegaly. Nonobstructive right nephrolithiasis measuring up to 5 mm. Colonic diverticulosis with no acute diverticulitis. Prostatomegaly. Trace right fat containing inguinal hernia. Small fat containing left inguinal hernia. Aortic Atherosclerosis (ICD10-I70.0). 7. No acute intrathoracic, intra-abdominal, intrapelvic traumatic injury. 8. Please see separately dictated CT thoracolumbar spine 03/31/2023. Electronically Signed   By: Tish Frederickson M.D.   On: 03/31/2023 20:53   DG Chest Port 1 View  Result Date: 03/31/2023 CLINICAL DATA:  fall EXAM: PORTABLE CHEST 1 VIEW COMPARISON:  Chest x-ray 01/30/2018 FINDINGS: The heart and mediastinal contours are unchanged. Bibasilar, right greater than left, patchy and streaky airspace opacities. No pulmonary edema. No pleural effusion. No pneumothorax. No acute osseous abnormality. IMPRESSION:  Bibasilar, right greater than left, patchy and streaky airspace opacities. Finding likely combination of atelectasis versus infection/inflammation. Followup PA and lateral chest X-ray is recommended in 3-4 weeks following therapy to ensure resolution and exclude underlying malignancy. Electronically Signed   By: Tish Frederickson M.D.   On: 03/31/2023 17:19   DG Pelvis Portable  Result Date: 03/31/2023 CLINICAL DATA:  Trauma EXAM: PORTABLE PELVIS 1 VIEWS COMPARISON:  None Available. FINDINGS: Limited examination consisting of a single view of both hips demonstrates bilateral hip degenerative changes. This examination can not exclude fractures. No obvious displaced fractures are seen. Pelvic ring appears to be intact. IMPRESSION: Bilateral hip degenerative changes. Evaluation for fractures was limited by lack of additional views. Electronically Signed   By: Layla Maw M.D.   On: 03/31/2023 17:19    Review of Systems Blood pressure (!) 138/93, pulse (!) 109, temperature 100.2 F (37.9 C), temperature source Oral, resp. rate 20, SpO2 95 %. 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  No clinical signs of acute cholecystitis  The patient is very high risk for perioperative complications and mortality with his current level of hepatic dysfunction.  Even without an INR to factor into the calculations, he has a MELD score - 14 (6.0% estimated 3- month mortality) and Childs C - (life expectancy 1-3 years; 82% predicted perioperative mortality).  I shared this information with  the patient.  Would appreciate GI's input on this patient's management.  Would not recommend urgent surgery.  Wilmon Arms Bartlett Enke 04/02/2023, 9:19 AM

## 2023-04-03 LAB — FOLATE: Folate: 7.2 ng/mL (ref >5.9)

## 2023-04-03 LAB — CBC
HCT: 37.6 % — ABNORMAL LOW (ref 39.0–52.0)
Hemoglobin: 13 g/dL (ref 13.0–17.0)
MCH: 33.2 pg (ref 26.0–34.0)
MCHC: 34.6 g/dL (ref 30.0–36.0)
MCV: 96.2 fL (ref 80.0–100.0)
Platelets: 148 10*3/uL — ABNORMAL LOW (ref 150–400)
RBC: 3.91 MIL/uL — ABNORMAL LOW (ref 4.22–5.81)
RDW: 13.9 % (ref 11.5–15.5)
WBC: 4.8 10*3/uL (ref 4.0–10.5)
nRBC: 0 % (ref 0.0–0.2)

## 2023-04-03 LAB — COMPREHENSIVE METABOLIC PANEL
ALT: 50 U/L — ABNORMAL HIGH (ref 0–44)
AST: 165 U/L — ABNORMAL HIGH (ref 15–41)
Albumin: 2.7 g/dL — ABNORMAL LOW (ref 3.5–5.0)
Alkaline Phosphatase: 113 U/L (ref 38–126)
Anion gap: 9 (ref 5–15)
BUN: <5 mg/dL — ABNORMAL LOW (ref 6–20)
CO2: 24 mmol/L (ref 22–32)
Calcium: 8.5 mg/dL — ABNORMAL LOW (ref 8.9–10.3)
Chloride: 99 mmol/L (ref 98–111)
Creatinine, Ser: 0.41 mg/dL — ABNORMAL LOW (ref 0.61–1.24)
GFR, Estimated: >60 mL/min (ref >60)
Glucose, Bld: 118 mg/dL — ABNORMAL HIGH (ref 70–99)
Potassium: 3.3 mmol/L — ABNORMAL LOW (ref 3.5–5.1)
Sodium: 132 mmol/L — ABNORMAL LOW (ref 135–145)
Total Bilirubin: 10 mg/dL — ABNORMAL HIGH (ref 0.3–1.2)
Total Protein: 7.6 g/dL (ref 6.5–8.1)

## 2023-04-03 LAB — T3: T3, Total: 103 ng/dL (ref 71–180)

## 2023-04-03 LAB — IRON AND TIBC
Iron: 86 ug/dL (ref 45–182)
Saturation Ratios: 46 % — ABNORMAL HIGH (ref 17.9–39.5)
TIBC: 189 ug/dL — ABNORMAL LOW (ref 250–450)
UIBC: 103 ug/dL

## 2023-04-03 LAB — MAGNESIUM
Magnesium: 1.6 mg/dL — ABNORMAL LOW (ref 1.7–2.4)
Magnesium: 1.7 mg/dL (ref 1.7–2.4)

## 2023-04-03 LAB — PHOSPHORUS
Phosphorus: 1.6 mg/dL — ABNORMAL LOW (ref 2.5–4.6)
Phosphorus: 2.4 mg/dL — ABNORMAL LOW (ref 2.5–4.6)

## 2023-04-03 LAB — HEPATITIS PANEL, ACUTE
HCV Ab: NONREACTIVE
Hep A IgM: NONREACTIVE
Hep B C IgM: NONREACTIVE
Hepatitis B Surface Ag: NONREACTIVE

## 2023-04-03 LAB — BASIC METABOLIC PANEL
Anion gap: 9 (ref 5–15)
BUN: <5 mg/dL — ABNORMAL LOW (ref 6–20)
CO2: 24 mmol/L (ref 22–32)
Calcium: 8.7 mg/dL — ABNORMAL LOW (ref 8.9–10.3)
Chloride: 100 mmol/L (ref 98–111)
Creatinine, Ser: 0.38 mg/dL — ABNORMAL LOW (ref 0.61–1.24)
GFR, Estimated: >60 mL/min (ref >60)
Glucose, Bld: 96 mg/dL (ref 70–99)
Potassium: 3.6 mmol/L (ref 3.5–5.1)
Sodium: 133 mmol/L — ABNORMAL LOW (ref 135–145)

## 2023-04-03 LAB — FERRITIN: Ferritin: 702 ng/mL — ABNORMAL HIGH (ref 24–336)

## 2023-04-03 LAB — RETICULOCYTES
Immature Retic Fract: 5.2 % (ref 2.3–15.9)
RBC.: 4.05 MIL/uL — ABNORMAL LOW (ref 4.22–5.81)
Retic Count, Absolute: 31.2 10*3/uL (ref 19.0–186.0)
Retic Ct Pct: 0.8 % (ref 0.4–3.1)

## 2023-04-03 LAB — LIPASE, BLOOD: Lipase: 141 U/L — ABNORMAL HIGH (ref 11–51)

## 2023-04-03 LAB — VITAMIN B12: Vitamin B-12: 875 pg/mL (ref 180–914)

## 2023-04-03 MED ORDER — MAGNESIUM SULFATE 4 GM/100ML IV SOLN
4.0000 g | Freq: Once | INTRAVENOUS | Status: AC
  Administered 2023-04-03: 4 g via INTRAVENOUS
  Filled 2023-04-03: qty 100

## 2023-04-03 MED ORDER — METOPROLOL TARTRATE 5 MG/5ML IV SOLN
2.5000 mg | Freq: Four times a day (QID) | INTRAVENOUS | Status: DC
  Administered 2023-04-03 – 2023-04-05 (×9): 2.5 mg via INTRAVENOUS
  Filled 2023-04-03 (×9): qty 5

## 2023-04-03 NOTE — Progress Notes (Signed)
Patient confused and hallucinating consistently trying to get up and interfering with medical treatment. On call provider made aware with new orders in: Non-violent restraints- waist belt ordered and carried out. We will continue to monitor.

## 2023-04-03 NOTE — Progress Notes (Signed)
[facility] Progress Note  Patient: Brian [name]     UEA:[id number]  DOA: 03/31/2023   PCP: Patient, No Pcp Per       Brief hospital course: This is a 62 year old male with hypertension who presents 2 days after a fall in the bathroom.  He continues to have pain in has had trouble ambulating. He is a poor historian.   Further history obtained from his partner. He has been drinking heavily after losing a sister 2 years ago and another 1 year ago. He drinks whiskey about 3 x a week, unknown quantity. He has been falling more frequently.   In the ED, noted to have: AST 180 ALT 53 alkaline phosphatase 124, T. bili 7.5 and a lipase of 368.  Anion gap was 18. A CT scan of the chest abdomen pelvis was performed which revealed - Acute pancreatitis without pseudocyst - Cholelithiasis with "question associated gallbladder wall thickening" - 4.6 cm left thyroid gland nodule with prevascular space mediastinal soft tissue density and enlarged lymph nodes-PET/CT recommended in addition to thyroid ultrasound - Hepatic steatosis and hepatomegaly - Nonobstructing right nephrolithiasis measuring up to 5 mm - Prostatomegaly - Colonic reticulosis - Trace right fat-containing inguinal hernia and small fat containing left inguinal hernia  Right upper quadrant ultrasound revealed gallbladder with gallstones and gallbladder wall thickening of 5 mm, nonspecific, negative Murphy sign, hepatic steatosis  Subjective:  Sedated with Ativan this AM. More confused overnight per RN.  Assessment and Plan: Principal Problem:   Acute pancreatitis- - had some back pain but no abdominal pain -Possibly gallstone pancreatitis-he admits to drinking but binge drinking and not daily drinking - advanced diet to solids on 4/14   Active Problems:  Alcohol abuse and withdrawal - worsening withdrawal over night 4/14- required restraints - CIWA scale with oral Ativan - Thiamine and folic acid ordered     Hypothermia/ SIRS -Temperature initially 93 F with tachycardia and blood pressure of 89/60 - TSH and Free T 4 normal - 4/14> temp 100.2 - due to confusion today, will obtain I and O cath and blood cultures    Hypokalemia - Replacing      Transaminitis and elevated bilirubin  - ?  Acute due to alcohol abuse versus chronic due to fatty liver - only slightly improved   Elevated INR - INR 1.4 - likely related to ETOH- given Vit K- will follow   Ketoacidosis -  improved    Hypoalbuminemia -Albumin 2.7    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 - Thyroid ultrasound> 4.9 cm, located in the inferior left thyroid lobe, meets criteria for FNA - will need to arrange oupt f/u - Thyroid functions - TSH and Free T 4 normal    Essential hypertension - HCTZ listed as home met but he states he does not take any medications at home - Amlodipine started- change to Lopressor IV until alert     Code Status: Full Code Consultants: None Level of Care: Level of care: Med-Surg Total time on patient care: 45 DVT prophylaxis:  enoxaparin (LOVENOX) injection 40 mg Start: 04/01/23 1000 SCDs Start: 04/01/23 0401 Have updated his partner, Roma Schanz [phone]    Objective:   Vitals:   04/03/23 0026 04/03/23 0204 04/03/23 0407 04/03/23 0903  BP: (!) 164/90 (!) 157/108 (!) 154/90 (!) 161/106  Pulse: 91 (!) 112 (!) 104 (!) 105  Resp: 18 18 18 18   Temp: 98.9 F (37.2 C) 99.6 F (37.6 C) 99.2 F (37.3 C) 99.4  F (37.4 C)  TempSrc: Oral Oral Oral Oral  SpO2: 98% 97% 98% 97%   There were no vitals filed for this visit. Exam: General exam: Appears comfortable - sedated  HEENT: oral mucosa moist Respiratory system: Clear to auscultation.  Cardiovascular system: S1 & S2 heard  Gastrointestinal system: Abdomen soft, , nondistended. Normal bowel sounds   Extremities: No cyanosis, clubbing or edema   CBC: Recent Labs  Lab 03/31/23 1633 04/01/23 0458 04/02/23 0526 04/03/23 0456  WBC  5.0 6.2 3.9* 4.8  NEUTROABS 3.9  --   --   --   HGB 13.5 13.1 11.5* 13.0  HCT 38.9* 38.0* 34.1* 37.6*  MCV 94.6 96.7 96.3 96.2  PLT 282 253 161 148*    Basic Metabolic Panel: Recent Labs  Lab 04/01/23 0458 04/01/23 1512 04/02/23 0526 04/02/23 1517 04/03/23 0456  NA 137 140 137 135 132*  K 3.1* 3.1* 2.8* 3.4* 3.3*  CL 103 105 102 101 99  CO2 20* 21* GLUCOSE 65* 111* 132* 154* 118*  BUN 5* <5*  CREATININE 0.56* 0.52* 0.44* 0.42* 0.41*  CALCIUM 7.5* 8.0* 8.0* 8.4* 8.5*  MG 1.8  --  1.7  --  1.6*  PHOS 4.7*  --  1.4*  --  1.6*    GFR: CrCl cannot be calculated (Unknown ideal weight.).  Scheduled Meds:  amLODipine  5 mg Oral Daily   enoxaparin (LOVENOX) injection  40 mg Subcutaneous Q24H   feeding supplement  237 mL Oral BID BM   folic acid  1 mg Oral Daily   multivitamin with minerals  1 tablet Oral Daily   pantoprazole  40 mg Oral Daily   thiamine  100 mg Oral Daily   Or   thiamine  100 mg Intravenous Daily   Continuous Infusions:  0.9 % NaCl with KCl 40 mEq / L 100 mL/hr at 04/03/23 9604   Imaging and lab data was personally reviewed MR ABDOMEN MRCP W WO CONTAST  Result Date: 04/01/2023 CLINICAL DATA:  Pancreatitis.  History of gallstones. EXAM: MRI ABDOMEN WITHOUT AND WITH CONTRAST (INCLUDING MRCP) TECHNIQUE: Multiplanar multisequence MR imaging of the abdomen was performed both before and after the administration of intravenous contrast. Heavily T2-weighted images of the biliary and pancreatic ducts were obtained, and three-dimensional MRCP images were rendered by post processing. CONTRAST:  10mL GADAVIST GADOBUTROL 1 MMOL/ML IV SOLN COMPARISON:  CT AP 03/31/2023 FINDINGS: Lower chest: No acute abnormality. Hepatobiliary: Marked hepatic steatosis. Hypertrophy of the caudate lobe and lateral segment of left hepatic lobe identified. Contour the liver is slightly nodular. Heterogeneous enhancement of the liver parenchyma is identified. No focal enhancing  liver lesions identified. The gallbladder is filled with multiple large stones. The largest is in the gallbladder neck measuring 1.3 cm. There is mild gallbladder wall thickening and edema measuring up to 5 mm, image 34/5. No intrahepatic or common bile duct dilatation. No signs of choledocholithiasis. Pancreas: Changes of acute pancreatitis are again identified with peripancreatic edema. Fluid is seen extending from the pancreas into the retroperitoneum bilaterally. No fluid collections identified to suggest pseudocyst. At the level of the tail the main duct is upper limits of normal measuring 3 mm. No mass identified. No signs of pancreatic necrosis. Spleen:  Within normal limits in size and appearance. Adrenals/Urinary Tract: No masses identified. No evidence of hydronephrosis. Stomach/Bowel: Small hiatal hernia. Stomach is nondistended. No bowel wall thickening or distension identified. Vascular/Lymphatic: Normal caliber abdominal aorta. Upper abdominal vascularity  including the portal vein is patent. Portacaval lymph node measures 1.5 cm, image 49/5. Porta hepatic lymph node measures 1 cm, image 43/5. Multiple prominent left retroperitoneal and gastrohepatic ligament lymph nodes are also noted. Other: No discrete fluid collections identified. Mild upper abdominal scratch set trace perihepatic and perisplenic fluid. Musculoskeletal: No enhancing bone lesions identified. IMPRESSION: 1. Acute pancreatitis. No evidence for pancreatic necrosis or pseudocyst. 2. Gallstones with mild gallbladder wall thickening and edema. No signs of choledocholithiasis. In the setting of acute pancreatitis gallbladder wall edema. Gallbladder wall edema may also be seen in the setting of cirrhosis 3. No significant bile duct dilatation or signs of choledocholithiasis. 4. Marked hepatic steatosis with morphologic features of the liver concerning for cirrhosis. 5. Small hiatal hernia. 6. Increased size of upper abdominal lymph nodes.  Nonspecific in the setting of cirrhosis as well as pancreatitis. Electronically Signed   By: Signa Kell M.D.   On: 04/01/2023 14:14   MR 3D Recon At Scanner  Result Date: 04/01/2023 CLINICAL DATA:  Pancreatitis.  History of gallstones. EXAM: MRI ABDOMEN WITHOUT AND WITH CONTRAST (INCLUDING MRCP) TECHNIQUE: Multiplanar multisequence MR imaging of the abdomen was performed both before and after the administration of intravenous contrast. Heavily T2-weighted images of the biliary and pancreatic ducts were obtained, and three-dimensional MRCP images were rendered by post processing. CONTRAST:  10mL GADAVIST GADOBUTROL 1 MMOL/ML IV SOLN COMPARISON:  CT AP 03/31/2023 FINDINGS: Lower chest: No acute abnormality. Hepatobiliary: Marked hepatic steatosis. Hypertrophy of the caudate lobe and lateral segment of left hepatic lobe identified. Contour the liver is slightly nodular. Heterogeneous enhancement of the liver parenchyma is identified. No focal enhancing liver lesions identified. The gallbladder is filled with multiple large stones. The largest is in the gallbladder neck measuring 1.3 cm. There is mild gallbladder wall thickening and edema measuring up to 5 mm, image 34/5. No intrahepatic or common bile duct dilatation. No signs of choledocholithiasis. Pancreas: Changes of acute pancreatitis are again identified with peripancreatic edema. Fluid is seen extending from the pancreas into the retroperitoneum bilaterally. No fluid collections identified to suggest pseudocyst. At the level of the tail the main duct is upper limits of normal measuring 3 mm. No mass identified. No signs of pancreatic necrosis. Spleen:  Within normal limits in size and appearance. Adrenals/Urinary Tract: No masses identified. No evidence of hydronephrosis. Stomach/Bowel: Small hiatal hernia. Stomach is nondistended. No bowel wall thickening or distension identified. Vascular/Lymphatic: Normal caliber abdominal aorta. Upper abdominal  vascularity including the portal vein is patent. Portacaval lymph node measures 1.5 cm, image 49/5. Porta hepatic lymph node measures 1 cm, image 43/5. Multiple prominent left retroperitoneal and gastrohepatic ligament lymph nodes are also noted. Other: No discrete fluid collections identified. Mild upper abdominal scratch set trace perihepatic and perisplenic fluid. Musculoskeletal: No enhancing bone lesions identified. IMPRESSION: 1. Acute pancreatitis. No evidence for pancreatic necrosis or pseudocyst. 2. Gallstones with mild gallbladder wall thickening and edema. No signs of choledocholithiasis. In the setting of acute pancreatitis gallbladder wall edema. Gallbladder wall edema may also be seen in the setting of cirrhosis 3. No significant bile duct dilatation or signs of choledocholithiasis. 4. Marked hepatic steatosis with morphologic features of the liver concerning for cirrhosis. 5. Small hiatal hernia. 6. Increased size of upper abdominal lymph nodes. Nonspecific in the setting of cirrhosis as well as pancreatitis. Electronically Signed   By: Signa Kell M.D.   On: 04/01/2023 14:14    LOS: 3 days   Author: Ladell Heads Eward Rutigliano  04/03/2023 1:02  PM  To contact [facility]>   Check the care team in [facility] and look for the attending/consulting [facility] provider listed  Log into [email or web address] and use [facility] universal password   Go to> "[facility]"  and find provider  If you still have difficulty reaching the provider, please page the [facility] (Director on Call) for the Hospitalists listed on amion

## 2023-04-03 NOTE — Progress Notes (Signed)
[facility] Gastroenterology Progress Note  Brian [name] 62 y.o. 21-Apr-1961  CC: Pancreatitis   Subjective: Patient seen and examined at bedside.  Confused today.  Laying in the bed.  Not able to obtain any meaningful history from the patient.  ROS : Not able to obtain    Objective: Vital signs in last 24 hours: Vitals:   04/03/23 0407 04/03/23 0903  BP: (!) 154/90 (!) 161/106  Pulse: (!) 104 (!) 105  Resp: 18 18  Temp: 99.2 F (37.3 C) 99.4 F (37.4 C)  SpO2: 98% 97%    Physical Exam: Confused, laying in the bed naked, abdominal exam benign.  Lab Results: Recent Labs    04/02/23 0526 04/02/23 1517 04/03/23 0456  NA 137 135 132*  K 2.8* 3.4* 3.3*  CL 102 101 99  CO2 25 25 24   GLUCOSE 132* 154* 118*  BUN 6 5* <5*  CREATININE 0.44* 0.42* 0.41*  CALCIUM 8.0* 8.4* 8.5*  MG 1.7  --  1.6*  PHOS 1.4*  --  1.6*   Recent Labs    04/02/23 0526 04/03/23 0456  AST 155* 165*  ALT 47* 50*  ALKPHOS 111 113  BILITOT 7.7* 10.0*  PROT 7.1 7.6  ALBUMIN 2.5* 2.7*   Recent Labs    03/31/23 1633 04/01/23 0458 04/02/23 0526 04/03/23 0456  WBC 5.0   < > 3.9* 4.8  NEUTROABS 3.9  --   --   --   HGB 13.5   < > 11.5* 13.0  HCT 38.9*   < > 34.1* 37.6*  MCV 94.6   < > 96.3 96.2  PLT 282   < > 161 148*   < > = values in this interval not displayed.   Recent Labs    04/02/23 1114  LABPROT 16.6*  INR 1.4*      Assessment/Plan: -Elevated LFTs in setting of alcohol use.  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  AST more than ALT.  Normal alk phos.  T. bili 10 today.  INR 1.4.  Discriminant function score of 16.4. 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 -Acute pancreatitis.  Likely from alcohol use.  No biliary ductal dilation or choledocholithiasis on MRI MRCP.  Gallstones seen on imaging.  Recommendations ------------------------ -Continue supportive care for now.  Discriminant function score less than 32.  No indication for steroid use at this  time. -Treatment of alcohol withdrawal per primary team. -He would benefit from ongoing IV hydration at least at 75 to 100 cc/h. -GI Will follow  Kathi Der MD, FACP 04/03/2023, 10:38 AM  Contact #  [phone]

## 2023-04-03 NOTE — TOC Initial Note (Addendum)
Transition of Care [facility]) - Initial/Assessment Note   Patient Details  Name: Brian [name] MRN: [id number] Date of Birth: 05/30/1961  [facility]) CM/SW Contact:    Ewing Schlein, LCSW Phone Number: 04/03/2023, 2:31 PM  Clinical Narrative: [facility] consulted for ETOH use resources and PCP needs. ETOH resources added to AVS. Wife agreeable to hospital follow up appointment being scheduled at a [facility] clinic. Patient's hospital follow up apppointment is scheduled for Monday Apr 24, 2023 at 2:30pm at [facility] and he will see Bertram Denver. Appointment added to AVS. CSW updated wife regarding appointment. Transportation resources added to AVS. Patient will need to call to be screened for eligibility, but wife reported children will assist with medical appointments as needed.  Expected Discharge Plan: Home/Self Care Barriers to Discharge: Inadequate or no insurance, Continued Medical Work up  Patient Goals and CMS Choice Choice offered to / list presented to : NA  Expected Discharge Plan and Services In-house Referral: Clinical Social Work Discharge Planning Services: Follow-up appt scheduled Post Acute Care Choice: NA Living arrangements for the past 2 months: Single Family Home           DME Arranged: N/A DME Agency: NA  Prior Living Arrangements/Services Living arrangements for the past 2 months: Single Family Home Lives with:: Spouse Patient language and need for interpreter reviewed:: Yes Do you feel safe going back to the place where you live?: Yes      Need for Family Participation in Patient Care: Yes (Comment) (Patient is currently disoriented x4.) Care giver support system in place?: Yes (comment) Criminal Activity/Legal Involvement Pertinent to Current Situation/Hospitalization: No - Comment as needed  Activities of Daily Living Home Assistive Devices/Equipment: Eyeglasses ADL Screening (condition at time of admission) Patient's cognitive ability  adequate to safely complete daily activities?: Yes Is the patient deaf or have difficulty hearing?: No Does the patient have difficulty seeing, even when wearing glasses/contacts?: No (reading glasses) Does the patient have difficulty concentrating, remembering, or making decisions?: No Patient able to express need for assistance with ADLs?: Yes Does the patient have difficulty dressing or bathing?: No Independently performs ADLs?: Yes (appropriate for developmental age) Does the patient have difficulty walking or climbing stairs?: Yes Weakness of Legs: None Weakness of Arms/Hands: None  Permission Sought/Granted Permission sought to share information with : Other (comment) Permission granted to share information with : Yes, Verbal Permission Granted Permission granted to share info w AGENCY: [facility]  Emotional Assessment Alcohol / Substance Use: Alcohol Use Psych Involvement: No (comment)  Admission diagnosis:  Acute pancreatitis [K85.90] Hyperbilirubinemia [E80.6] Biliary calculus of other site without obstruction [K80.80] Acute pancreatitis, unspecified complication status, unspecified pancreatitis type [K85.90] Patient Active Problem List   Diagnosis Date Noted   Hypokalemia 04/01/2023   Transaminitis 04/01/2023   Hyperbilirubinemia 04/01/2023   Alcoholic ketoacidosis 04/01/2023   Hypoalbuminemia due to protein-calorie malnutrition 04/01/2023   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 04/01/2023   Alcohol abuse 04/01/2023   Hypothermia 04/01/2023   Essential hypertension 04/01/2023   Acute pancreatitis 03/31/2023   PCP:  Patient, No Pcp Per Pharmacy:   [id number] - [place], [place] - [address] AT [facility] [address] [place] [address] Phone: [phone] Fax: [phone]  Social Determinants of Health (SDOH) Social History: SDOH Screenings   Food Insecurity: No Food Insecurity (04/01/2023)   Housing: Low Risk  (04/01/2023)  Transportation Needs: Unmet Transportation Needs (04/01/2023)  Utilities: Not At Risk (04/01/2023)   SDOH Interventions: Transportation Interventions: Inpatient TOC, Other (Comment) (Transportation resources added to AVS. Wife aware he will need to call to screened for eligibility.)  Readmission Risk Interventions     No data to display

## 2023-04-03 NOTE — Progress Notes (Addendum)
[facility] Progress Note     Subjective: CC-  Pleasantly confused this morning. Likely withdrawing from alcohol. Denies abdominal pain, nausea, or vomiting.  Objective: Vital signs in last 24 hours: Temp:  [98.4 F (36.9 C)-100.2 F (37.9 C)] 99.4 F (37.4 C) (04/15 0903) Pulse Rate:  [66-112] 105 (04/15 0903) Resp:  [16-20] 18 (04/15 0903) BP: (138-174)/(89-114) 161/106 (04/15 0903) SpO2:  [95 %-98 %] 97 % (04/15 0903) Last BM Date : 04/01/23  Intake/Output from previous day: 04/14 0701 - 04/15 0700 In: 2568.4 [P.O.:760; I.V.:1787.9; IV Piggyback:20.6] Out: 2150 [Urine:2150] Intake/Output this shift: Total I/O In: -  Out: 800 [Urine:800]  PE: Gen:  Alert, NAD Pulm:  rate and effort normal on room air Abd: soft, ND, NT Psych: oriented to self. States he is at home in [place], does not know the year  Lab Results:  Recent Labs    04/02/23 0526 04/03/23 0456  WBC 3.9* 4.8  HGB 11.5* 13.0  HCT 34.1* 37.6*  PLT 161 148*   BMET Recent Labs    04/02/23 1517 04/03/23 0456  NA 135 132*  K 3.4* 3.3*  CL 101 99  CO2 25 24  GLUCOSE 154* 118*  BUN 5* <5*  CREATININE 0.42* 0.41*  CALCIUM 8.4* 8.5*   PT/INR Recent Labs    04/02/23 1114  LABPROT 16.6*  INR 1.4*   CMP     Component Value Date/Time   NA 132 (L) 04/03/2023 0456   K 3.3 (L) 04/03/2023 0456   CL 99 04/03/2023 0456   CO2 24 04/03/2023 0456   GLUCOSE 118 (H) 04/03/2023 0456   BUN <5 (L) 04/03/2023 0456   CREATININE 0.41 (L) 04/03/2023 0456   CALCIUM 8.5 (L) 04/03/2023 0456   PROT 7.6 04/03/2023 0456   ALBUMIN 2.7 (L) 04/03/2023 0456   AST 165 (H) 04/03/2023 0456   ALT 50 (H) 04/03/2023 0456   ALKPHOS 113 04/03/2023 0456   BILITOT 10.0 (H) 04/03/2023 0456   GFRNONAA >60 04/03/2023 0456   GFRAA >60 01/30/2018 1333   Lipase     Component Value Date/Time   LIPASE 141 (H) 04/03/2023 0456       Studies/Results: MR ABDOMEN MRCP W WO CONTAST  Result Date:  04/01/2023 CLINICAL DATA:  Pancreatitis.  History of gallstones. EXAM: MRI ABDOMEN WITHOUT AND WITH CONTRAST (INCLUDING MRCP) TECHNIQUE: Multiplanar multisequence MR imaging of the abdomen was performed both before and after the administration of intravenous contrast. Heavily T2-weighted images of the biliary and pancreatic ducts were obtained, and three-dimensional MRCP images were rendered by post processing. CONTRAST:  10mL GADAVIST GADOBUTROL 1 MMOL/ML IV SOLN COMPARISON:  CT AP 03/31/2023 FINDINGS: Lower chest: No acute abnormality. Hepatobiliary: Marked hepatic steatosis. Hypertrophy of the caudate lobe and lateral segment of left hepatic lobe identified. Contour the liver is slightly nodular. Heterogeneous enhancement of the liver parenchyma is identified. No focal enhancing liver lesions identified. The gallbladder is filled with multiple large stones. The largest is in the gallbladder neck measuring 1.3 cm. There is mild gallbladder wall thickening and edema measuring up to 5 mm, image 34/5. No intrahepatic or common bile duct dilatation. No signs of choledocholithiasis. Pancreas: Changes of acute pancreatitis are again identified with peripancreatic edema. Fluid is seen extending from the pancreas into the retroperitoneum bilaterally. No fluid collections identified to suggest pseudocyst. At the level of the tail the main duct is upper limits of normal measuring 3 mm. No mass identified. No signs of pancreatic necrosis. Spleen:  Within  normal limits in size and appearance. Adrenals/Urinary Tract: No masses identified. No evidence of hydronephrosis. Stomach/Bowel: Small hiatal hernia. Stomach is nondistended. No bowel wall thickening or distension identified. Vascular/Lymphatic: Normal caliber abdominal aorta. Upper abdominal vascularity including the portal vein is patent. Portacaval lymph node measures 1.5 cm, image 49/5. Porta hepatic lymph node measures 1 cm, image 43/5. Multiple prominent left  retroperitoneal and gastrohepatic ligament lymph nodes are also noted. Other: No discrete fluid collections identified. Mild upper abdominal scratch set trace perihepatic and perisplenic fluid. Musculoskeletal: No enhancing bone lesions identified. IMPRESSION: 1. Acute pancreatitis. No evidence for pancreatic necrosis or pseudocyst. 2. Gallstones with mild gallbladder wall thickening and edema. No signs of choledocholithiasis. In the setting of acute pancreatitis gallbladder wall edema. Gallbladder wall edema may also be seen in the setting of cirrhosis 3. No significant bile duct dilatation or signs of choledocholithiasis. 4. Marked hepatic steatosis with morphologic features of the liver concerning for cirrhosis. 5. Small hiatal hernia. 6. Increased size of upper abdominal lymph nodes. Nonspecific in the setting of cirrhosis as well as pancreatitis. Electronically Signed   By: Signa Kell M.D.   On: 04/01/2023 14:14   MR 3D Recon At Scanner  Result Date: 04/01/2023 CLINICAL DATA:  Pancreatitis.  History of gallstones. EXAM: MRI ABDOMEN WITHOUT AND WITH CONTRAST (INCLUDING MRCP) TECHNIQUE: Multiplanar multisequence MR imaging of the abdomen was performed both before and after the administration of intravenous contrast. Heavily T2-weighted images of the biliary and pancreatic ducts were obtained, and three-dimensional MRCP images were rendered by post processing. CONTRAST:  29mL GADAVIST GADOBUTROL 1 MMOL/ML IV SOLN COMPARISON:  CT AP 03/31/2023 FINDINGS: Lower chest: No acute abnormality. Hepatobiliary: Marked hepatic steatosis. Hypertrophy of the caudate lobe and lateral segment of left hepatic lobe identified. Contour the liver is slightly nodular. Heterogeneous enhancement of the liver parenchyma is identified. No focal enhancing liver lesions identified. The gallbladder is filled with multiple large stones. The largest is in the gallbladder neck measuring 1.3 cm. There is mild gallbladder wall thickening  and edema measuring up to 5 mm, image 34/5. No intrahepatic or common bile duct dilatation. No signs of choledocholithiasis. Pancreas: Changes of acute pancreatitis are again identified with peripancreatic edema. Fluid is seen extending from the pancreas into the retroperitoneum bilaterally. No fluid collections identified to suggest pseudocyst. At the level of the tail the main duct is upper limits of normal measuring 3 mm. No mass identified. No signs of pancreatic necrosis. Spleen:  Within normal limits in size and appearance. Adrenals/Urinary Tract: No masses identified. No evidence of hydronephrosis. Stomach/Bowel: Small hiatal hernia. Stomach is nondistended. No bowel wall thickening or distension identified. Vascular/Lymphatic: Normal caliber abdominal aorta. Upper abdominal vascularity including the portal vein is patent. Portacaval lymph node measures 1.5 cm, image 49/5. Porta hepatic lymph node measures 1 cm, image 43/5. Multiple prominent left retroperitoneal and gastrohepatic ligament lymph nodes are also noted. Other: No discrete fluid collections identified. Mild upper abdominal scratch set trace perihepatic and perisplenic fluid. Musculoskeletal: No enhancing bone lesions identified. IMPRESSION: 1. Acute pancreatitis. No evidence for pancreatic necrosis or pseudocyst. 2. Gallstones with mild gallbladder wall thickening and edema. No signs of choledocholithiasis. In the setting of acute pancreatitis gallbladder wall edema. Gallbladder wall edema may also be seen in the setting of cirrhosis 3. No significant bile duct dilatation or signs of choledocholithiasis. 4. Marked hepatic steatosis with morphologic features of the liver concerning for cirrhosis. 5. Small hiatal hernia. 6. Increased size of upper abdominal lymph nodes. Nonspecific  in the setting of cirrhosis as well as pancreatitis. Electronically Signed   By: Signa Kell M.D.   On: 04/01/2023 14:14    Anti-infectives: Anti-infectives (From  admission, onward)    None        Assessment/Plan Alcohol abuse, withdrawal  Acute Pancreatitis - lipase trending down and pt without abdominal complaints; uncertain etiology; no evidence of choledocholithiasis on MRCP Hyperbilirubinemia - Tbili up to 10 today. 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  No clinical signs of acute cholecystitis   - The patient is very high risk for perioperative complications and mortality with his current level of hepatic dysfunction.  MELD score - 22 (19.6% estimated 3- month mortality) and Childs C - (life expectancy 1-3 years; 82% predicted perioperative mortality).  Would not recommend cholecystectomy in this patient. We will sign off, please call with questions or concerns.    ID - none FEN - IVF, D3 diet VTE - lovenox, SCDs Foley - none  I reviewed Consultant Gastroenterology notes, hospitalist notes, last 24 h vitals and pain scores, last 48 h intake and output, last 24 h labs and trends, and last 24 h imaging results.    LOS: 3 days    Franne Forts, [facility] 04/03/2023, 9:05 AM Please see Amion for pager number during day hours 7:00am-4:30pm

## 2023-04-03 NOTE — Progress Notes (Signed)
CCMD reported a HR 140's to 150's and is going to a fib. Patient was assessed and found to be  standing up pulled hi IV and primofit patient was assisted back to his bed PRN ativan was given notified on call provider J.Garner Nash, NP .

## 2023-04-04 LAB — CBC
HCT: 37.3 % — ABNORMAL LOW (ref 39.0–52.0)
Hemoglobin: 12.4 g/dL — ABNORMAL LOW (ref 13.0–17.0)
MCH: 32.8 pg (ref 26.0–34.0)
MCHC: 33.2 g/dL (ref 30.0–36.0)
MCV: 98.7 fL (ref 80.0–100.0)
Platelets: 155 10*3/uL (ref 150–400)
RBC: 3.78 MIL/uL — ABNORMAL LOW (ref 4.22–5.81)
RDW: 14.3 % (ref 11.5–15.5)
WBC: 4.9 10*3/uL (ref 4.0–10.5)
nRBC: 0 % (ref 0.0–0.2)

## 2023-04-04 LAB — PROTIME-INR
INR: 1.3 — ABNORMAL HIGH (ref 0.8–1.2)
Prothrombin Time: 16.4 seconds — ABNORMAL HIGH (ref 11.4–15.2)

## 2023-04-04 LAB — COMPREHENSIVE METABOLIC PANEL WITH GFR
ALT: 49 U/L — ABNORMAL HIGH (ref 0–44)
AST: 140 U/L — ABNORMAL HIGH (ref 15–41)
Albumin: 2.5 g/dL — ABNORMAL LOW (ref 3.5–5.0)
Alkaline Phosphatase: 111 U/L (ref 38–126)
Anion gap: 7 (ref 5–15)
BUN: 8 mg/dL (ref 6–20)
CO2: 23 mmol/L (ref 22–32)
Calcium: 8.5 mg/dL — ABNORMAL LOW (ref 8.9–10.3)
Chloride: 103 mmol/L (ref 98–111)
Creatinine, Ser: 0.41 mg/dL — ABNORMAL LOW (ref 0.61–1.24)
GFR, Estimated: >60 mL/min
Glucose, Bld: 108 mg/dL — ABNORMAL HIGH (ref 70–99)
Potassium: 4.2 mmol/L (ref 3.5–5.1)
Sodium: 133 mmol/L — ABNORMAL LOW (ref 135–145)
Total Bilirubin: 9.9 mg/dL — ABNORMAL HIGH (ref 0.3–1.2)
Total Protein: 7.7 g/dL (ref 6.5–8.1)

## 2023-04-04 LAB — MAGNESIUM: Magnesium: 2.6 mg/dL — ABNORMAL HIGH (ref 1.7–2.4)

## 2023-04-04 LAB — PHOSPHORUS: Phosphorus: 2.9 mg/dL (ref 2.5–4.6)

## 2023-04-04 LAB — CULTURE, BLOOD (ROUTINE X 2): Culture: NO GROWTH

## 2023-04-04 MED ORDER — AMLODIPINE BESYLATE 10 MG PO TABS
10.0000 mg | ORAL_TABLET | Freq: Every day | ORAL | Status: DC
  Administered 2023-04-05: 10 mg via ORAL
  Filled 2023-04-04: qty 1

## 2023-04-04 MED ORDER — SODIUM CHLORIDE 0.9 % IV SOLN: INTRAVENOUS | Status: DC

## 2023-04-04 NOTE — Progress Notes (Addendum)
[facility] Progress Note  Patient: Brian [name]     ZOX:[id number]  DOA: 03/31/2023   PCP: Patient, No Pcp Per       Brief hospital course: This is a 62 year old male with hypertension who presents 2 days after a fall in the bathroom.  He continues to have pain in has had trouble ambulating. He is a poor historian.   Further history obtained from his partner. He has been drinking heavily after losing a sister 2 years ago and another 1 year ago. He drinks whiskey about 3 x a week, unknown quantity. He has been falling more frequently.   In the ED, noted to have: AST 180 ALT 53 alkaline phosphatase 124, T. bili 7.5 and a lipase of 368.  Anion gap was 18. A CT scan of the chest abdomen pelvis was performed which revealed - Acute pancreatitis without pseudocyst - Cholelithiasis with "question associated gallbladder wall thickening" - 4.6 cm left thyroid gland nodule with prevascular space mediastinal soft tissue density and enlarged lymph nodes-PET/CT recommended in addition to thyroid ultrasound - Hepatic steatosis and hepatomegaly - Nonobstructing right nephrolithiasis measuring up to 5 mm - Prostatomegaly - Colonic reticulosis - Trace right fat-containing inguinal hernia and small fat containing left inguinal hernia  Right upper quadrant ultrasound revealed gallbladder with gallstones and gallbladder wall thickening of 5 mm, nonspecific, negative Murphy sign, hepatic steatosis  Subjective:  No complaints. Tremulous today- not routinely so.   Assessment and Plan: Principal Problem:   Acute pancreatitis- - had some back pain but no abdominal pain -Possibly gallstone pancreatitis-he admits to drinking but binge drinking and not daily drinking - advanced diet to solids on 4/14   Active Problems:  Alcohol abuse and withdrawal - worsening withdrawal over night 4/14- required restraints- seems to be improving now - CIWA scale with oral Ativan- still having tremors and is  tachycardic despite Lopressor (for BP)  - Thiamine and folic acid ordered    Hypothermia/ SIRS -Temperature initially 93 F with tachycardia and blood pressure of 89/60 - TSH and Free T 4 normal - 4/14> temp 100.2  4/15> - due to confusion ordered blood cultures and I and O cath for UA- UA was not sent - cultures neg so far - follow w/o antibiotics    Hypokalemia - Replaced     Transaminitis and elevated bilirubin  - ?  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 - Thyroid ultrasound> 4.9 cm, located in the inferior left thyroid lobe, meets criteria for FNA - will need to arrange oupt f/u - Thyroid functions - TSH and Free T 4 normal    Essential hypertension - HCTZ listed as home met but he states he does not take any medications at home - Amlodipine started- change to Lopressor IV until alert     Code Status: Full Code Consultants: None Level of Care: Level of care: Med-Surg Total time on patient care: 45 DVT prophylaxis:  enoxaparin (LOVENOX) injection 40 mg Start: 04/01/23 1000 SCDs Start: 04/01/23 0401 Have updated his partner, Roma Schanz [phone]    Objective:   Vitals:   04/03/23 2020 04/04/23 0223 04/04/23 0355 04/04/23 1246  BP: 125/78 (!) 131/92 (!) 148/97 135/80  Pulse: 100 (!) 110 Marland Kitchen)  108 (!) 107  Resp: Temp: 98.9 F (37.2 C) 99 F (37.2 C) 99.5 F (37.5 C) 99.5 F (37.5 C)  TempSrc: Oral Oral Oral Oral  SpO2: 98% 98% 98% 98%   There were no vitals filed for this visit. Exam: General exam: Appears comfortable - tremors of hands HEENT: oral mucosa moist Respiratory system: Clear to auscultation.  Cardiovascular system: S1 & S2 heard - tachycardic Gastrointestinal system: Abdomen soft, non-tender, nondistended.  Normal bowel sounds   Extremities: No cyanosis, clubbing or edema Psychiatry:  Mood & affect appropriate.     CBC: Recent Labs  Lab 03/31/23 1633 04/01/23 0458 04/02/23 0526 04/03/23 0456 04/04/23 0450  WBC 5.0 6.2 3.9* 4.8 4.9  NEUTROABS 3.9  --   --   --   --   HGB 13.5 13.1 11.5* 13.0 12.4*  HCT 38.9* 38.0* 34.1* 37.6* 37.3*  MCV 94.6 96.7 96.3 96.2 98.7  PLT 282 253 161 148* 155    Basic Metabolic Panel: Recent Labs  Lab 04/01/23 0458 04/01/23 1512 04/02/23 0526 04/02/23 1517 04/03/23 0456 04/03/23 1358 04/04/23 0450  NA 137   < > 137 135 132* 133* 133*  K 3.1*   < > 2.8* 3.4* 3.3* 3.6 4.2  CL 103   < > 102 101 99 100 103  CO2 20*   < > GLUCOSE 65*   < > 132* 154* 118* 96 108*  BUN 9   < > 6 5* <5* <5* 8  CREATININE 0.56*   < > 0.44* 0.42* 0.41* 0.38* 0.41*  CALCIUM 7.5*   < > 8.0* 8.4* 8.5* 8.7* 8.5*  MG 1.8  --  1.7  --  1.6* 1.7 2.6*  PHOS 4.7*  --  1.4*  --  1.6* 2.4* 2.9   < > = values in this interval not displayed.    GFR: CrCl cannot be calculated (Unknown ideal weight.).  Scheduled Meds:  enoxaparin (LOVENOX) injection  40 mg Subcutaneous Q24H   folic acid  1 mg Oral Daily   metoprolol tartrate  2.5 mg Intravenous Q6H   thiamine  100 mg Oral Daily   Or   thiamine  100 mg Intravenous Daily   Continuous Infusions:  0.9 % NaCl with KCl 40 mEq / L 100 mL/hr at 04/04/23 0751   Imaging and lab data was personally reviewed No results found.  LOS: 4 days   Author: Calvert Cantor  04/04/2023 4:14 PM  To contact [facility]>   Check the care team in [facility] and look for the attending/consulting [facility] provider listed  Log into [email or web address] and use [place]'s universal password   Go to> "[facility]"  and find provider  If you still have difficulty reaching the provider, please page the [facility] (Director on Call) for the Hospitalists listed on amion

## 2023-04-04 NOTE — Progress Notes (Signed)
Patient was calm through the night as well as alert and oriented. He continues to be calm this morning. He walked to the bathroom with assistance and is up in the chair. The soft waist restraint has been discontinued, and the doctor has been notified.

## 2023-04-04 NOTE — Progress Notes (Signed)
[facility] Gastroenterology Progress Note  Brian [name] 62 y.o. September 17, 1961  CC: Pancreatitis   Subjective: Patient seen and examined at bedside.  He appears much better today compared to yesterday.  Alert and oriented.  Denies any acute GI issues.  ROS : Denies abdominal pain, denies chest pain.   Objective: Vital signs in last 24 hours: Vitals:   04/04/23 0223 04/04/23 0355  BP: (!) 131/92 (!) 148/97  Pulse: (!) 110 (!) 108  Resp: 20 20  Temp: 99 F (37.2 C) 99.5 F (37.5 C)  SpO2: 98% 98%    Physical Exam: Sitting in a chair comfortably.  Eating lunch.  Abdominal exam benign.  Lab Results: Recent Labs    04/03/23 1358 04/04/23 0450  NA 133* 133*  K 3.6 4.2  CL 100 103  CO2 24 23  GLUCOSE 96 108*  BUN <5* 8  CREATININE 0.38* 0.41*  CALCIUM 8.7* 8.5*  MG 1.7 2.6*  PHOS 2.4* 2.9   Recent Labs    04/03/23 0456 04/04/23 0450  AST 165* 140*  ALT 50* 49*  ALKPHOS 113 111  BILITOT 10.0* 9.9*  PROT 7.6 7.7  ALBUMIN 2.7* 2.5*   Recent Labs    04/03/23 0456 04/04/23 0450  WBC 4.8 4.9  HGB 13.0 12.4*  HCT 37.6* 37.3*  MCV 96.2 98.7  PLT 148* 155   Recent Labs    04/02/23 1114 04/04/23 0450  LABPROT 16.6* 16.4*  INR 1.4* 1.3*      Assessment/Plan: -Elevated LFTs in setting of alcohol use.  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  AST more than ALT.  Normal alk phos.  T. bili 9.9 today.  INR 1.3  Discriminant function score of 16.4. 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 -Acute pancreatitis.  Likely from alcohol use.  No biliary ductal dilation or choledocholithiasis on MRI MRCP.  Gallstones seen on imaging.  Recommendations ------------------------ -Continue supportive care for now.  Discriminant function score less than 32.  No indication for steroid use at this time. -Treatment of alcohol withdrawal per primary team. -He would benefit from ongoing IV hydration at least at 75 to 100 cc/h. -GI Will follow  Kathi Der MD,  FACP 04/04/2023, 11:49 AM  Contact #  [phone]

## 2023-04-05 LAB — COMPREHENSIVE METABOLIC PANEL
ALT: 53 U/L — ABNORMAL HIGH (ref 0–44)
AST: 121 U/L — ABNORMAL HIGH (ref 15–41)
Albumin: 2.7 g/dL — ABNORMAL LOW (ref 3.5–5.0)
Alkaline Phosphatase: 121 U/L (ref 38–126)
Anion gap: 7 (ref 5–15)
BUN: 12 mg/dL (ref 6–20)
CO2: 23 mmol/L (ref 22–32)
Calcium: 8.9 mg/dL (ref 8.9–10.3)
Chloride: 101 mmol/L (ref 98–111)
Creatinine, Ser: 0.61 mg/dL (ref 0.61–1.24)
GFR, Estimated: >60 mL/min (ref >60)
Glucose, Bld: 124 mg/dL — ABNORMAL HIGH (ref 70–99)
Potassium: 4.2 mmol/L (ref 3.5–5.1)
Sodium: 131 mmol/L — ABNORMAL LOW (ref 135–145)
Total Bilirubin: 8.8 mg/dL — ABNORMAL HIGH (ref 0.3–1.2)
Total Protein: 8.2 g/dL — ABNORMAL HIGH (ref 6.5–8.1)

## 2023-04-05 LAB — PROTIME-INR
INR: 1.2 (ref 0.8–1.2)
Prothrombin Time: 15.5 seconds — ABNORMAL HIGH (ref 11.4–15.2)

## 2023-04-05 LAB — CULTURE, BLOOD (ROUTINE X 2): Special Requests: ADEQUATE

## 2023-04-05 MED ORDER — VITAMIN B-1 100 MG PO TABS: 100.0000 mg | ORAL_TABLET | Freq: Every day | ORAL | Status: DC

## 2023-04-05 MED ORDER — AMLODIPINE BESYLATE 10 MG PO TABS: 10.0000 mg | ORAL_TABLET | Freq: Every day | ORAL | 2 refills | Status: DC

## 2023-04-05 NOTE — Discharge Summary (Signed)
Physician Discharge Summary  Brian [name] ZOX:[id number] DOB: 10/30/1961 DOA: 03/31/2023  PCP: Patient, No Pcp Per  Admit date: 03/31/2023 Discharge date: 04/05/2023  Admitted From home Disposition: Home Recommendations for Outpatient Follow-up:  Follow up with PCP in 1-2 weeks Please obtain INR/CMP/CBC in one week patient had elevated LFTs during this hospital stay Follow-up with [facility] in 4 to 8 weeks Bertram Denver please arrange for him to have fine-needle aspiration of his thyroid gland as he has a nodule in his thyroid.  Home Health: Yes Equipment/Devices: None Discharge Condition: Stable CODE STATUS: Full code  Diet recommendation: Cardiac Brief/Interim Summary: 62 year old male with hypertension who presents 2 days after a fall in the bathroom.  He continues to have pain in has had trouble ambulating.He has been drinking heavily after losing a sister 2 years ago and another 1 year ago. He drinks whiskey about 3 x a week, unknown quantity. He has been falling more frequently.  In the ED, noted to have: AST 180 ALT 53 alkaline phosphatase 124, T. bili 7.5 and a lipase of 368.  Anion gap was 18. A CT scan of the chest abdomen pelvis was performed which revealed Acute pancreatitis without pseudocyst Cholelithiasis with "question associated gallbladder wall thickening"4.6 cm left thyroid gland nodule with prevascular space mediastinal soft tissue density and enlarged lymph nodes-PET/CT recommended in addition to thyroid ultrasound - Hepatic steatosis and hepatomegaly - Nonobstructing right nephrolithiasis measuring up to 5 mm - Prostatomegaly - Colonic reticulosis - Trace right fat-containing inguinal hernia and small fat containing left inguinal hernia Right upper quadrant ultrasound revealed gallbladder with gallstones and gallbladder wall thickening of 5 mm, nonspecific, negative Murphy sign, hepatic steatosis  Discharge Diagnoses:  Principal Problem:   Acute  pancreatitis Active Problems:   Hypokalemia   Transaminitis   Hyperbilirubinemia   Alcoholic ketoacidosis   Hypoalbuminemia due to protein-calorie malnutrition   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  Patient was admitted to the floor treated with IV fluids n.p.o. pain control.  He was followed by GI during this hospital stay.  Patient tolerated a diet prior to discharge    Alcohol abuse and withdrawal-treated with CIWA protocol thiamine and folic acid.     Hypothermia/ SIRS-patient was hypothermic tachycardic and hypotensive.  Cultures remain negative.  He was not given any antibiotics.  TSH normal.      Hypokalemia Resolved     Transaminitis and elevated bilirubin -due to EtOH abuse in the setting of fatty liver.  Improved and resolved    Elevated INR-resolved.  He received 1 dose of vitamin K.  -Ketoacidosis Resolved.      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   There is no height or weight on file to calculate BMI.  Discharge Instructions  Discharge Instructions     Diet - low sodium heart healthy   Complete by: As directed    Increase activity slowly   Complete by: As directed    No wound care   Complete by: As directed       Allergies as of 04/05/2023   No Known Allergies      Medication  List     STOP taking these medications    hydrochlorothiazide 25 MG tablet Commonly known as: HYDRODIURIL       TAKE these medications    amLODipine 10 MG tablet Commonly known as: NORVASC Take 1 tablet (10 mg total) by mouth daily. Start taking on: April 06, 2023   Fish Oil 1000 MG Caps Take 2,000 mg by mouth daily.    multivitamin with minerals tablet Take 1 tablet by mouth daily.   thiamine 100 MG tablet Commonly known as: Vitamin B-1 Take 1 tablet (100 mg total) by mouth daily. Start taking on: April 06, 2023        Follow-up Information     [facility] Follow up on 04/24/2023.   Why: Your hospital follow up apppointment is scheduled for Monday Apr 24, 2023 at 2:30pm. You will see Bertram Denver. Please arrive at least 15-20 minutes before your scheduled appointment. Contact information: [address] [place] [address] [phone]        [employer]. Call.   Why: Trips must be scheduled in advance, no later than noon the  working day before your trip. Contact information: Call [phone] to be screened for eligibility.  Monday-Friday 8 a.m.-5 p.m.        [facility] [employer]. Call.   Why: Call to be screened to transportation eligibility. Contact information: [address] [place], [place] [address] Phone: [phone]        Gastroenterology, [facility] Sprang. Schedule an appointment as soon as possible for a visit in 6 week(s).   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 [place] [address] [phone]                No Known Allergies  Consultations: gi   Procedures/Studies: MR ABDOMEN MRCP W WO CONTAST  Result Date: 04/01/2023 CLINICAL DATA:  Pancreatitis.  History of gallstones. EXAM: MRI ABDOMEN WITHOUT AND WITH CONTRAST (INCLUDING MRCP) TECHNIQUE: Multiplanar multisequence MR imaging of the abdomen was performed both before and after the administration of intravenous contrast. Heavily T2-weighted images of the biliary and pancreatic ducts were obtained, and three-dimensional MRCP images were rendered by post processing. CONTRAST:  10mL GADAVIST GADOBUTROL 1 MMOL/ML IV SOLN  COMPARISON:  CT AP 03/31/2023 FINDINGS: Lower chest: No acute abnormality. Hepatobiliary: Marked hepatic steatosis. Hypertrophy of the caudate lobe and lateral segment of left hepatic lobe identified. Contour the liver is slightly nodular. Heterogeneous enhancement of the liver parenchyma is identified. No focal enhancing liver lesions identified. The gallbladder is filled with multiple large stones. The largest is in the gallbladder neck measuring 1.3 cm. There is mild gallbladder wall thickening and edema measuring up to 5 mm, image 34/5. No intrahepatic or common bile duct dilatation. No signs of choledocholithiasis. Pancreas: Changes of acute pancreatitis are again identified with peripancreatic edema. Fluid is seen extending from the pancreas into the retroperitoneum bilaterally. No fluid collections identified to suggest pseudocyst. At the level of the tail the main duct is upper limits of normal measuring 3 mm. No mass identified. No signs of pancreatic necrosis. Spleen:  Within normal limits in size and appearance. Adrenals/Urinary Tract: No masses identified. No evidence of hydronephrosis. Stomach/Bowel: Small hiatal hernia. Stomach is nondistended. No bowel wall thickening or distension identified. Vascular/Lymphatic: Normal caliber abdominal aorta. Upper abdominal vascularity including the portal vein is patent. Portacaval lymph node measures 1.5 cm, image 49/5.  Porta hepatic lymph node measures 1 cm, image 43/5. Multiple prominent left retroperitoneal and gastrohepatic ligament lymph nodes are also noted. Other: No discrete fluid collections identified. Mild upper abdominal scratch set trace perihepatic and perisplenic fluid. Musculoskeletal: No enhancing bone lesions identified. IMPRESSION: 1. Acute pancreatitis. No evidence for pancreatic necrosis or pseudocyst. 2. Gallstones with mild gallbladder wall thickening and edema. No signs of choledocholithiasis. In the setting of acute pancreatitis  gallbladder wall edema. Gallbladder wall edema may also be seen in the setting of cirrhosis 3. No significant bile duct dilatation or signs of choledocholithiasis. 4. Marked hepatic steatosis with morphologic features of the liver concerning for cirrhosis. 5. Small hiatal hernia. 6. Increased size of upper abdominal lymph nodes. Nonspecific in the setting of cirrhosis as well as pancreatitis. Electronically Signed   By: Signa Kell M.D.   On: 04/01/2023 14:14   MR 3D Recon At Scanner  Result Date: 04/01/2023 CLINICAL DATA:  Pancreatitis.  History of gallstones. EXAM: MRI ABDOMEN WITHOUT AND WITH CONTRAST (INCLUDING MRCP) TECHNIQUE: Multiplanar multisequence MR imaging of the abdomen was performed both before and after the administration of intravenous contrast. Heavily T2-weighted images of the biliary and pancreatic ducts were obtained, and three-dimensional MRCP images were rendered by post processing. CONTRAST:  10mL GADAVIST GADOBUTROL 1 MMOL/ML IV SOLN COMPARISON:  CT AP 03/31/2023 FINDINGS: Lower chest: No acute abnormality. Hepatobiliary: Marked hepatic steatosis. Hypertrophy of the caudate lobe and lateral segment of left hepatic lobe identified. Contour the liver is slightly nodular. Heterogeneous enhancement of the liver parenchyma is identified. No focal enhancing liver lesions identified. The gallbladder is filled with multiple large stones. The largest is in the gallbladder neck measuring 1.3 cm. There is mild gallbladder wall thickening and edema measuring up to 5 mm, image 34/5. No intrahepatic or common bile duct dilatation. No signs of choledocholithiasis. Pancreas: Changes of acute pancreatitis are again identified with peripancreatic edema. Fluid is seen extending from the pancreas into the retroperitoneum bilaterally. No fluid collections identified to suggest pseudocyst. At the level of the tail the main duct is upper limits of normal measuring 3 mm. No mass identified. No signs of  pancreatic necrosis. Spleen:  Within normal limits in size and appearance. Adrenals/Urinary Tract: No masses identified. No evidence of hydronephrosis. Stomach/Bowel: Small hiatal hernia. Stomach is nondistended. No bowel wall thickening or distension identified. Vascular/Lymphatic: Normal caliber abdominal aorta. Upper abdominal vascularity including the portal vein is patent. Portacaval lymph node measures 1.5 cm, image 49/5. Porta hepatic lymph node measures 1 cm, image 43/5. Multiple prominent left retroperitoneal and gastrohepatic ligament lymph nodes are also noted. Other: No discrete fluid collections identified. Mild upper abdominal scratch set trace perihepatic and perisplenic fluid. Musculoskeletal: No enhancing bone lesions identified. IMPRESSION: 1. Acute pancreatitis. No evidence for pancreatic necrosis or pseudocyst. 2. Gallstones with mild gallbladder wall thickening and edema. No signs of choledocholithiasis. In the setting of acute pancreatitis gallbladder wall edema. Gallbladder wall edema may also be seen in the setting of cirrhosis 3. No significant bile duct dilatation or signs of choledocholithiasis. 4. Marked hepatic steatosis with morphologic features of the liver concerning for cirrhosis. 5. Small hiatal hernia. 6. Increased size of upper abdominal lymph nodes. Nonspecific in the setting of cirrhosis as well as pancreatitis. Electronically Signed   By: Signa Kell M.D.   On: 04/01/2023 14:14   US THYROID  Result Date: 04/01/2023 CLINICAL DATA:  Thyroid nodule seen on CT angiography of the chest EXAM: THYROID ULTRASOUND TECHNIQUE: Ultrasound examination of the  thyroid gland and adjacent soft tissues was performed. COMPARISON:  CT angiography chest 03/31/2023 FINDINGS: Parenchymal Echotexture: Mildly heterogenous Isthmus: 0.3 cm Right lobe: 3.0 x 1.6 x 1.3 cm Left lobe: 7.5 x 3.6 x 4.6 cm _________________________________________________________ Estimated total number of nodules >/= 1  cm: 1 Number of spongiform nodules >/=  2 cm not described below (TR1): 0 Number of mixed cystic and solid nodules >/= 1.5 cm not described below (TR2): 0 _________________________________________________________ Nodule # 1: Location: Left; inferior Maximum size: 4.9 cm; Other 2 dimensions: 4.6 x 4.7 cm Composition: solid/almost completely solid (2) Echogenicity: isoechoic (1) Shape: not taller-than-wide (0) Margins: smooth (0) Echogenic foci: none (0) ACR TI-RADS total points: 3. ACR TI-RADS risk category: TR3 (3 points). ACR TI-RADS recommendations: **Given size (>/= 2.5 cm) and appearance, fine needle aspiration of this mildly suspicious nodule should be considered based on TI-RADS criteria. This nodule corresponds to the abnormality seen on chest CT _________________________________________________________ IMPRESSION: Nodule 1 (TI-RADS 3), measuring 4.9 cm, located in the inferior left thyroid lobe, meets criteria for FNA. The above is in keeping with the ACR TI-RADS recommendations - J Am Coll Radiol 2017;14:587-595. Electronically Signed   By: Acquanetta Belling M.D.   On: 04/01/2023 06:08   US Abdomen Limited RUQ (LIVER/GB)  Result Date: 03/31/2023 CLINICAL DATA:  Epigastric pain. EXAM: ULTRASOUND ABDOMEN LIMITED RIGHT UPPER QUADRANT COMPARISON:  CT earlier today FINDINGS: Gallbladder: Multiple shadowing gallstones obscuring the posterior gallbladder wall. Likely gallbladder sludge. There is mild gallbladder wall thickening at 4-5 mm. No pericholecystic fluid. No sonographic Murphy sign noted by sonographer. Common bile duct: Diameter: 4-5 mm, nondilated. Liver: Heterogeneous and diffusely increased in parenchymal echogenicity. The liver parenchyma is difficult to penetrate. No evidence of focal lesion. No convincing capsular nodularity. Portal vein is patent on color Doppler imaging with normal direction of blood flow towards the liver. Other: No right upper quadrant ascites. IMPRESSION: 1. Gallbladder filled  with gallstones. Gallbladder wall thickening of 4-5 mm, nonspecific. Negative sonographic Murphy sign. 2. No biliary dilatation. 3. Hepatic steatosis. Electronically Signed   By: Narda Rutherford M.D.   On: 03/31/2023 21:59   CT L-SPINE NO CHARGE  Result Date: 03/31/2023 CLINICAL DATA:  Polytrauma, blunt.  Fall.  Diffuse abdominal pain. EXAM: CT CERVICAL, THORACIC, AND LUMBAR SPINE WITHOUT CONTRAST TECHNIQUE: Multidetector CT imaging of the cervical, thoracic and lumbar spine was performed without intravenous contrast. Multiplanar CT image reconstructions were also generated. RADIATION DOSE REDUCTION: This exam was performed according to the departmental dose-optimization program which includes automated exposure control, adjustment of the mA and/or kV according to patient size and/or use of iterative reconstruction technique. COMPARISON:  None Available. FINDINGS: CT CERVICAL SPINE FINDINGS Alignment: Normal. Skull base and vertebrae: Multilevel moderate degenerative changes spine. No associated severe osseous neural foraminal central canal stenosis. No acute fracture. No aggressive appearing focal osseous lesion or focal pathologic process. Soft tissues and spinal canal: No prevertebral fluid or swelling. No visible canal hematoma. Upper chest: Unremarkable. Other: Left thyroid gland nodule-please see separately dictated CT chest 03/31/2023. CT THORACIC SPINE FINDINGS Alignment: Normal. Vertebrae: Severe contiguous osteophyte formation. Multilevel mild facet arthropathy. Posterior longitudinal ligament calcification. No acute fracture or focal pathologic process. Paraspinal and other soft tissues: Negative. Disc levels: Maintained. CT LUMBAR SPINE FINDINGS Segmentation: 5 lumbar type vertebrae. Alignment: Normal. Vertebrae: Multilevel moderate degenerative changes spine with posterior disc osteophyte complex formation at the L2-L3, L3-L4, L4-L5, L5-S1 levels. No associated severe osseous neural foraminal or  central canal stenosis. No acute fracture  or focal pathologic process. Paraspinal and other soft tissues: Negative. Disc levels: Maintained. Other: Moderate degenerative changes of the right hip. Atherosclerotic plaque. IMPRESSION: 1. No acute displaced fracture or traumatic listhesis of the cervical, thoracic, lumbar spine. 2. Thoracic findings suggestive of DISH. 3. Left thyroid gland nodule-please see separately dictated CT chest 03/31/2023. 4.  Aortic Atherosclerosis (ICD10-I70.0). Electronically Signed   By: Tish Frederickson M.D.   On: 03/31/2023 21:20   CT T-SPINE NO CHARGE  Result Date: 03/31/2023 CLINICAL DATA:  Polytrauma, blunt.  Fall.  Diffuse abdominal pain. EXAM: CT CERVICAL, THORACIC, AND LUMBAR SPINE WITHOUT CONTRAST TECHNIQUE: Multidetector CT imaging of the cervical, thoracic and lumbar spine was performed without intravenous contrast. Multiplanar CT image reconstructions were also generated. RADIATION DOSE REDUCTION: This exam was performed according to the departmental dose-optimization program which includes automated exposure control, adjustment of the mA and/or kV according to patient size and/or use of iterative reconstruction technique. COMPARISON:  None Available. FINDINGS: CT CERVICAL SPINE FINDINGS Alignment: Normal. Skull base and vertebrae: Multilevel moderate degenerative changes spine. No associated severe osseous neural foraminal central canal stenosis. No acute fracture. No aggressive appearing focal osseous lesion or focal pathologic process. Soft tissues and spinal canal: No prevertebral fluid or swelling. No visible canal hematoma. Upper chest: Unremarkable. Other: Left thyroid gland nodule-please see separately dictated CT chest 03/31/2023. CT THORACIC SPINE FINDINGS Alignment: Normal. Vertebrae: Severe contiguous osteophyte formation. Multilevel mild facet arthropathy. Posterior longitudinal ligament calcification. No acute fracture or focal pathologic process. Paraspinal and  other soft tissues: Negative. Disc levels: Maintained. CT LUMBAR SPINE FINDINGS Segmentation: 5 lumbar type vertebrae. Alignment: Normal. Vertebrae: Multilevel moderate degenerative changes spine with posterior disc osteophyte complex formation at the L2-L3, L3-L4, L4-L5, L5-S1 levels. No associated severe osseous neural foraminal or central canal stenosis. No acute fracture or focal pathologic process. Paraspinal and other soft tissues: Negative. Disc levels: Maintained. Other: Moderate degenerative changes of the right hip. Atherosclerotic plaque. IMPRESSION: 1. No acute displaced fracture or traumatic listhesis of the cervical, thoracic, lumbar spine. 2. Thoracic findings suggestive of DISH. 3. Left thyroid gland nodule-please see separately dictated CT chest 03/31/2023. 4.  Aortic Atherosclerosis (ICD10-I70.0). Electronically Signed   By: Tish Frederickson M.D.   On: 03/31/2023 21:20   CT Cervical Spine Wo Contrast  Result Date: 03/31/2023 CLINICAL DATA:  Polytrauma, blunt.  Fall.  Diffuse abdominal pain. EXAM: CT CERVICAL, THORACIC, AND LUMBAR SPINE WITHOUT CONTRAST TECHNIQUE: Multidetector CT imaging of the cervical, thoracic and lumbar spine was performed without intravenous contrast. Multiplanar CT image reconstructions were also generated. RADIATION DOSE REDUCTION: This exam was performed according to the departmental dose-optimization program which includes automated exposure control, adjustment of the mA and/or kV according to patient size and/or use of iterative reconstruction technique. COMPARISON:  None Available. FINDINGS: CT CERVICAL SPINE FINDINGS Alignment: Normal. Skull base and vertebrae: Multilevel moderate degenerative changes spine. No associated severe osseous neural foraminal central canal stenosis. No acute fracture. No aggressive appearing focal osseous lesion or focal pathologic process. Soft tissues and spinal canal: No prevertebral fluid or swelling. No visible canal hematoma. Upper  chest: Unremarkable. Other: Left thyroid gland nodule-please see separately dictated CT chest 03/31/2023. CT THORACIC SPINE FINDINGS Alignment: Normal. Vertebrae: Severe contiguous osteophyte formation. Multilevel mild facet arthropathy. Posterior longitudinal ligament calcification. No acute fracture or focal pathologic process. Paraspinal and other soft tissues: Negative. Disc levels: Maintained. CT LUMBAR SPINE FINDINGS Segmentation: 5 lumbar type vertebrae. Alignment: Normal. Vertebrae: Multilevel moderate degenerative changes spine with posterior disc osteophyte complex  formation at the L2-L3, L3-L4, L4-L5, L5-S1 levels. No associated severe osseous neural foraminal or central canal stenosis. No acute fracture or focal pathologic process. Paraspinal and other soft tissues: Negative. Disc levels: Maintained. Other: Moderate degenerative changes of the right hip. Atherosclerotic plaque. IMPRESSION: 1. No acute displaced fracture or traumatic listhesis of the cervical, thoracic, lumbar spine. 2. Thoracic findings suggestive of DISH. 3. Left thyroid gland nodule-please see separately dictated CT chest 03/31/2023. 4.  Aortic Atherosclerosis (ICD10-I70.0). Electronically Signed   By: Tish Frederickson M.D.   On: 03/31/2023 21:20   CT Angio Chest PE W/Cm &/Or Wo Cm  Result Date: 03/31/2023 CLINICAL DATA:  Pulmonary embolism (PE) suspected, low to intermediate prob, positive D-dimer; Abdominal pain, acute, nonlocalized elevated bili, hypotensive, fall 2 days ago EXAM: CT ANGIOGRAPHY CHEST CT ABDOMEN AND PELVIS WITH CONTRAST TECHNIQUE: Multidetector CT imaging of the chest was performed using the standard protocol during bolus administration of intravenous contrast. Multiplanar CT image reconstructions and MIPs were obtained to evaluate the vascular anatomy. Multidetector CT imaging of the abdomen and pelvis was performed using the standard protocol during bolus administration of intravenous contrast. RADIATION DOSE  REDUCTION: This exam was performed according to the departmental dose-optimization program which includes automated exposure control, adjustment of the mA and/or kV according to patient size and/or use of iterative reconstruction technique. CONTRAST:  OMNIPAQUE IOHEXOL 350 MG/ML SOLN COMPARISON:  None Available. FINDINGS: CTA CHEST FINDINGS Cardiovascular: Satisfactory opacification of the pulmonary arteries to the segmental level. No evidence of pulmonary embolism. Normal heart size. No significant pericardial effusion. The thoracic aorta is normal in caliber. Mild atherosclerotic plaque of the thoracic aorta. No coronary artery calcifications. Mediastinum/Nodes: Fat stranding and soft tissue density with enlarged lymph nodes along the low pre-vascular space (5: 66-106). Enlarged prevascular lymph node measuring 1.6 cm. No enlarged hilar or axillary lymph nodes. Trachea and esophagus esophagus demonstrate no significant findings. Tiny hiatal hernia. Enlarged left thyroid gland with a heterogeneous hypodense 4.2 x 4.6 cm thyroid gland nodule. Lungs/Pleura: Low lung volumes. Bilateral lower lobe atelectasis. No focal consolidation. No pulmonary nodule. No pulmonary mass. No pleural effusion. No pneumothorax. Musculoskeletal: No chest wall abnormality. No suspicious lytic or blastic osseous lesions. No acute displaced fracture. Please see separately dictated CT thoracic spine 03/31/2023. Review of the MIP images confirms the above findings. CT ABDOMEN and PELVIS FINDINGS Hepatobiliary: The liver is enlarged measuring up to 21.5 cm. The hepatic parenchyma is diffusely hypodense compared to the splenic parenchyma consistent with fatty infiltration. No focal liver abnormality. Calcified gallstones within the gallbladder lumen. Question gallbladder wall thickening. No definite pericholecystic fluid. No biliary dilatation. Pancreas: Peripancreatic fat stranding. Slight haziness of the pancreatic contour proximally.  The pancreas appears slightly diffusely atrophic. No main pancreatic ductal dilatation. Spleen: Normal in size without focal abnormality. Adrenals/Urinary Tract: No adrenal nodule bilaterally. Bilateral kidneys enhance symmetrically. No hydronephrosis. No hydroureter. Right nephrolithiasis measuring up to 5 mm. No left nephrolithiasis. No ureterolithiasis bilaterally. The urinary bladder is unremarkable. On delayed imaging, there is no urothelial wall thickening and there are no filling defects in the opacified portions of the bilateral collecting systems or ureters. Stomach/Bowel: Stomach is within normal limits. No evidence of bowel wall thickening or dilatation. Colonic diverticulosis. Appendix appears normal. Vascular/Lymphatic: No abdominal aorta or iliac aneurysm. Moderate atherosclerotic plaque of the aorta and its branches. No abdominal, pelvic, or inguinal lymphadenopathy. Reproductive: The prostate is enlarged measuring up to 5 cm. Other: No intraperitoneal free fluid. No intraperitoneal free gas. No  organized fluid collection. Musculoskeletal: Trace right fat containing inguinal hernia. Small fat containing left inguinal hernia. No suspicious lytic or blastic osseous lesions. No acute displaced fracture. Please see separately dictated CT lumbar spine 03/31/2023. Review of the MIP images confirms the above findings. IMPRESSION: 1. No pulmonary embolus. 2. A 4.6 cm left thyroid gland nodule with prevascular space mediastinal soft tissue density and enlarged lymph nodes. Recommend thyroid US (ref: J Am Coll Radiol. 2015 Feb;12(2): 143-50). Given mediastinal findings, consider PET-CT following thyroid ultrasound. 3. Acute pancreatitis.  No pseudocyst formation. 4. Cholelithiasis with question associated gallbladder wall thickening. Consider right upper quadrant ultrasound for further evaluation. 5. Bilateral lower lobe atelectasis. Superimposed developing infection not fully excluded. Followup PA and lateral  chest X-ray is recommended in 3-4 weeks following therapy to ensure resolution. 6. Other imaging findings of potential clinical significance: Tiny hiatal hernia. Hepatic steatosis and hepatomegaly. Nonobstructive right nephrolithiasis measuring up to 5 mm. Colonic diverticulosis with no acute diverticulitis. Prostatomegaly. Trace right fat containing inguinal hernia. Small fat containing left inguinal hernia. Aortic Atherosclerosis (ICD10-I70.0). 7. No acute intrathoracic, intra-abdominal, intrapelvic traumatic injury. 8. Please see separately dictated CT thoracolumbar spine 03/31/2023. Electronically Signed   By: Tish Frederickson M.D.   On: 03/31/2023 20:53   CT Abdomen Pelvis W Contrast  Result Date: 03/31/2023 CLINICAL DATA:  Pulmonary embolism (PE) suspected, low to intermediate prob, positive D-dimer; Abdominal pain, acute, nonlocalized elevated bili, hypotensive, fall 2 days ago EXAM: CT ANGIOGRAPHY CHEST CT ABDOMEN AND PELVIS WITH CONTRAST TECHNIQUE: Multidetector CT imaging of the chest was performed using the standard protocol during bolus administration of intravenous contrast. Multiplanar CT image reconstructions and MIPs were obtained to evaluate the vascular anatomy. Multidetector CT imaging of the abdomen and pelvis was performed using the standard protocol during bolus administration of intravenous contrast. RADIATION DOSE REDUCTION: This exam was performed according to the departmental dose-optimization program which includes automated exposure control, adjustment of the mA and/or kV according to patient size and/or use of iterative reconstruction technique. CONTRAST:  OMNIPAQUE IOHEXOL 350 MG/ML SOLN COMPARISON:  None Available. FINDINGS: CTA CHEST FINDINGS Cardiovascular: Satisfactory opacification of the pulmonary arteries to the segmental level. No evidence of pulmonary embolism. Normal heart size. No significant pericardial effusion. The thoracic aorta is normal in caliber. Mild  atherosclerotic plaque of the thoracic aorta. No coronary artery calcifications. Mediastinum/Nodes: Fat stranding and soft tissue density with enlarged lymph nodes along the low pre-vascular space (5: 66-106). Enlarged prevascular lymph node measuring 1.6 cm. No enlarged hilar or axillary lymph nodes. Trachea and esophagus esophagus demonstrate no significant findings. Tiny hiatal hernia. Enlarged left thyroid gland with a heterogeneous hypodense 4.2 x 4.6 cm thyroid gland nodule. Lungs/Pleura: Low lung volumes. Bilateral lower lobe atelectasis. No focal consolidation. No pulmonary nodule. No pulmonary mass. No pleural effusion. No pneumothorax. Musculoskeletal: No chest wall abnormality. No suspicious lytic or blastic osseous lesions. No acute displaced fracture. Please see separately dictated CT thoracic spine 03/31/2023. Review of the MIP images confirms the above findings. CT ABDOMEN and PELVIS FINDINGS Hepatobiliary: The liver is enlarged measuring up to 21.5 cm. The hepatic parenchyma is diffusely hypodense compared to the splenic parenchyma consistent with fatty infiltration. No focal liver abnormality. Calcified gallstones within the gallbladder lumen. Question gallbladder wall thickening. No definite pericholecystic fluid. No biliary dilatation. Pancreas: Peripancreatic fat stranding. Slight haziness of the pancreatic contour proximally. The pancreas appears slightly diffusely atrophic. No main pancreatic ductal dilatation. Spleen: Normal in size without focal abnormality. Adrenals/Urinary Tract: No adrenal  nodule bilaterally. Bilateral kidneys enhance symmetrically. No hydronephrosis. No hydroureter. Right nephrolithiasis measuring up to 5 mm. No left nephrolithiasis. No ureterolithiasis bilaterally. The urinary bladder is unremarkable. On delayed imaging, there is no urothelial wall thickening and there are no filling defects in the opacified portions of the bilateral collecting systems or ureters.  Stomach/Bowel: Stomach is within normal limits. No evidence of bowel wall thickening or dilatation. Colonic diverticulosis. Appendix appears normal. Vascular/Lymphatic: No abdominal aorta or iliac aneurysm. Moderate atherosclerotic plaque of the aorta and its branches. No abdominal, pelvic, or inguinal lymphadenopathy. Reproductive: The prostate is enlarged measuring up to 5 cm. Other: No intraperitoneal free fluid. No intraperitoneal free gas. No organized fluid collection. Musculoskeletal: Trace right fat containing inguinal hernia. Small fat containing left inguinal hernia. No suspicious lytic or blastic osseous lesions. No acute displaced fracture. Please see separately dictated CT lumbar spine 03/31/2023. Review of the MIP images confirms the above findings. IMPRESSION: 1. No pulmonary embolus. 2. A 4.6 cm left thyroid gland nodule with prevascular space mediastinal soft tissue density and enlarged lymph nodes. Recommend thyroid US (ref: J Am Coll Radiol. 2015 Feb;12(2): 143-50). Given mediastinal findings, consider PET-CT following thyroid ultrasound. 3. Acute pancreatitis.  No pseudocyst formation. 4. Cholelithiasis with question associated gallbladder wall thickening. Consider right upper quadrant ultrasound for further evaluation. 5. Bilateral lower lobe atelectasis. Superimposed developing infection not fully excluded. Followup PA and lateral chest X-ray is recommended in 3-4 weeks following therapy to ensure resolution. 6. Other imaging findings of potential clinical significance: Tiny hiatal hernia. Hepatic steatosis and hepatomegaly. Nonobstructive right nephrolithiasis measuring up to 5 mm. Colonic diverticulosis with no acute diverticulitis. Prostatomegaly. Trace right fat containing inguinal hernia. Small fat containing left inguinal hernia. Aortic Atherosclerosis (ICD10-I70.0). 7. No acute intrathoracic, intra-abdominal, intrapelvic traumatic injury. 8. Please see separately dictated CT  thoracolumbar spine 03/31/2023. Electronically Signed   By: Tish Frederickson M.D.   On: 03/31/2023 20:53   DG Chest Port 1 View  Result Date: 03/31/2023 CLINICAL DATA:  fall EXAM: PORTABLE CHEST 1 VIEW COMPARISON:  Chest x-ray 01/30/2018 FINDINGS: The heart and mediastinal contours are unchanged. Bibasilar, right greater than left, patchy and streaky airspace opacities. No pulmonary edema. No pleural effusion. No pneumothorax. No acute osseous abnormality. IMPRESSION: Bibasilar, right greater than left, patchy and streaky airspace opacities. Finding likely combination of atelectasis versus infection/inflammation. Followup PA and lateral chest X-ray is recommended in 3-4 weeks following therapy to ensure resolution and exclude underlying malignancy. Electronically Signed   By: Tish Frederickson M.D.   On: 03/31/2023 17:19   DG Pelvis Portable  Result Date: 03/31/2023 CLINICAL DATA:  Trauma EXAM: PORTABLE PELVIS 1 VIEWS COMPARISON:  None Available. FINDINGS: Limited examination consisting of a single view of both hips demonstrates bilateral hip degenerative changes. This examination can not exclude fractures. No obvious displaced fractures are seen. Pelvic ring appears to be intact. IMPRESSION: Bilateral hip degenerative changes. Evaluation for fractures was limited by lack of additional views. Electronically Signed   By: Layla Maw M.D.   On: 03/31/2023 17:19   (Echo, Carotid, EGD, Colonoscopy, ERCP)    Subjective: Sitting up in chair anxious to go home no complaints  Discharge Exam: Vitals:   04/05/23 0550 04/05/23 1345  BP: (!) 148/83 (!) 143/91  Pulse: (!) 102 100  Resp: 20 16  Temp: 98.2 F (36.8 C) 98.6 F (37 C)  SpO2: 99% 100%   Vitals:   04/04/23 1246 04/04/23 2221 04/05/23 0550 04/05/23 1345  BP: 135/80 115/82 Marland Kitchen)  148/83 (!) 143/91  Pulse: (!) 107 (!) 104 (!) 102 100  Resp: 18 14 20 16   Temp: 99.5 F (37.5 C) 98.6 F (37 C) 98.2 F (36.8 C) 98.6 F (37 C)  TempSrc: Oral  Oral Oral Oral  SpO2: 98% 99% 99% 100%    General: Pt is alert, awake, not in acute distress Cardiovascular: RRR, S1/S2 +, no rubs, no gallops Respiratory: CTA bilaterally, no wheezing, no rhonchi Abdominal: Soft, NT, ND, bowel sounds + Extremities: 1+ edema, no cyanosis    The results of significant diagnostics from this hospitalization (including imaging, microbiology, ancillary and laboratory) are listed below for reference.     Microbiology: Recent Results (from the past 240 hour(s))  Culture, blood (Routine X 2) w Reflex to ID Panel     Status: None (Preliminary result)   Collection Time: 04/03/23  1:45 PM   Specimen: BLOOD  Result Value Ref Range Status   Specimen Description   Final    BLOOD SPECIMEN SOURCE NOT MARKED ON REQUISITION Performed at [facility], [address], [place], [place] [address]    Special Requests   Final    AEROBIC BOTTLE ONLY Blood Culture adequate volume Performed at [facility], [address] [place], [place] [address]    Culture   Final    NO GROWTH 2 DAYS Performed at [facility], [address] [place], [place] [address]    Report Status PENDING  Incomplete  Culture, blood (Routine X 2) w Reflex to ID Panel     Status: None (Preliminary result)   Collection Time: 04/03/23  1:46 PM   Specimen: BLOOD RIGHT ARM  Result Value Ref Range Status   Specimen Description   Final    BLOOD RIGHT ARM Performed at [facility], [address] [place], [place] [address]    Special Requests   Final    AEROBIC BOTTLE ONLY Blood Culture adequate volume Performed at [facility], [address], [place], [place] [address]    Culture   Final    NO GROWTH 2 DAYS Performed at [facility], [address] [place], [place] [address]    Report Status PENDING  Incomplete     Labs: BNP (last 3 results) No results for input(s): "BNP" in the last 8760  hours. Basic Metabolic Panel: Recent Labs  Lab 04/01/23 0458 04/01/23 1512 04/02/23 0526 04/02/23 1517 04/03/23 0456 04/03/23 1358 04/04/23 0450 04/05/23 1009  NA 137   < > 137 135 132* 133* 133* 131*  K 3.1*   < > 2.8* 3.4* 3.3* 3.6 4.2 4.2  CL 103   < > 102 101 99 100 103 101  CO2 20*   < > 25 25 24 24 23 23   GLUCOSE 65*   < > 132* 154* 118* 96 108* 124*  BUN 9   < > 6 5* <5* <5* 8 12  CREATININE 0.56*   < > 0.44* 0.42* 0.41* 0.38* 0.41* 0.61  CALCIUM 7.5*   < > 8.0* 8.4* 8.5* 8.7* 8.5* 8.9  MG 1.8  --  1.7  --  1.6* 1.7 2.6*  --   PHOS 4.7*  --  1.4*  --  1.6* 2.4* 2.9  --    < > = values in this interval not displayed.   Liver Function Tests: Recent Labs  Lab 04/01/23 1512 04/02/23 0526 04/03/23 0456 04/04/23 0450 04/05/23 1009  AST 178* 155* 165*  140* 121*  ALT 53* 47* 50* 49* 53*  ALKPHOS 126 111 113 111 121  BILITOT 7.6* 7.7* 10.0* 9.9* 8.8*  PROT 7.7 7.1 7.6 7.7 8.2*  ALBUMIN 2.7* 2.5* 2.7* 2.5* 2.7*   Recent Labs  Lab 03/31/23 1633 04/02/23 1114 04/03/23 0456  LIPASE 368* 207* 141*   No results for input(s): "AMMONIA" in the last 168 hours. CBC: Recent Labs  Lab 03/31/23 1633 04/01/23 0458 04/02/23 0526 04/03/23 0456 04/04/23 0450  WBC 5.0 6.2 3.9* 4.8 4.9  NEUTROABS 3.9  --   --   --   --   HGB 13.5 13.1 11.5* 13.0 12.4*  HCT 38.9* 38.0* 34.1* 37.6* 37.3*  MCV 94.6 96.7 96.3 96.2 98.7  PLT 282 253 161 148* 155   Cardiac Enzymes: No results for input(s): "CKTOTAL", "CKMB", "CKMBINDEX", "TROPONINI" in the last 168 hours. BNP: Invalid input(s): "POCBNP" CBG: No results for input(s): "GLUCAP" in the last 168 hours. D-Dimer No results for input(s): "DDIMER" in the last 72 hours. Hgb A1c No results for input(s): "HGBA1C" in the last 72 hours. Lipid Profile No results for input(s): "CHOL", "HDL", "LDLCALC", "TRIG", "CHOLHDL", "LDLDIRECT" in the last 72 hours. Thyroid function studies No results for input(s): "TSH", "T4TOTAL", "T3FREE",  "THYROIDAB" in the last 72 hours.  Invalid input(s): "FREET3" Anemia work up Recent Labs    04/03/23 1359  VITAMINB12 875  FOLATE 7.2  FERRITIN 702*  TIBC 189*  IRON 86  RETICCTPCT 0.8   Urinalysis No results found for: "COLORURINE", "APPEARANCEUR", "LABSPEC", "PHURINE", "GLUCOSEU", "HGBUR", "BILIRUBINUR", "KETONESUR", "PROTEINUR", "UROBILINOGEN", "NITRITE", "LEUKOCYTESUR" Sepsis Labs Recent Labs  Lab 04/01/23 0458 04/02/23 0526 04/03/23 0456 04/04/23 0450  WBC 6.2 3.9* 4.8 4.9   Microbiology Recent Results (from the past 240 hour(s))  Culture, blood (Routine X 2) w Reflex to ID Panel     Status: None (Preliminary result)   Collection Time: 04/03/23  1:45 PM   Specimen: BLOOD  Result Value Ref Range Status   Specimen Description   Final    BLOOD SPECIMEN SOURCE NOT MARKED ON REQUISITION Performed at [facility], [address], [place], [place] [address]    Special Requests   Final    AEROBIC BOTTLE ONLY Blood Culture adequate volume Performed at [facility], [address], [place], [place] [address]    Culture   Final    NO GROWTH 2 DAYS Performed at [facility], [address], [place], [place] [address]    Report Status PENDING  Incomplete  Culture, blood (Routine X 2) w Reflex to ID Panel     Status: None (Preliminary result)   Collection Time: 04/03/23  1:46 PM   Specimen: BLOOD RIGHT ARM  Result Value Ref Range Status   Specimen Description   Final    BLOOD RIGHT ARM Performed at [facility], [address] [place], [place] [address]    Special Requests   Final    AEROBIC BOTTLE ONLY Blood Culture adequate volume Performed at [facility], [address], [place], [place] [address]    Culture   Final    NO GROWTH 2 DAYS Performed at [facility], [address], [place], [place] [address]    Report Status PENDING  Incomplete     Time coordinating  discharge: 39 minutes  SIGNED:  Alwyn Ren, MD  [facility] 04/05/2023, 5:38 PM

## 2023-04-05 NOTE — Progress Notes (Signed)
[facility] Gastroenterology Progress Note  Brian [name] 62 y.o. 01-Jun-1961  CC: Pancreatitis   Subjective: Patient seen and examined at bedside.  Sitting in chair.  Denies any acute issues.  ROS : Denies abdominal pain, denies chest pain.   Objective: Vital signs in last 24 hours: Vitals:   04/04/23 2221 04/05/23 0550  BP: 115/82 (!) 148/83  Pulse: (!) 104 (!) 102  Resp: 14 20  Temp: 98.6 F (37 C) 98.2 F (36.8 C)  SpO2: 99% 99%    Physical Exam: Sitting in a chair comfortably.    Abdominal exam benign.  Lab Results: Recent Labs    04/03/23 1358 04/04/23 0450 04/05/23 1009  NA 133* 133* 131*  K 3.6 4.2 4.2  CL 100 103 101  CO2 GLUCOSE 96 108* 124*  BUN <5* 8 12  CREATININE 0.38* 0.41* 0.61  CALCIUM 8.7* 8.5* 8.9  MG 1.7 2.6*  --   PHOS 2.4* 2.9  --    Recent Labs    04/04/23 0450 04/05/23 1009  AST 140* 121*  ALT 49* 53*  ALKPHOS 111 121  BILITOT 9.9* 8.8*  PROT 7.7 8.2*  ALBUMIN 2.5* 2.7*   Recent Labs    04/03/23 0456 04/04/23 0450  WBC 4.8 4.9  HGB 13.0 12.4*  HCT 37.6* 37.3*  MCV 96.2 98.7  PLT 148* 155   Recent Labs    04/04/23 0450 04/05/23 1009  LABPROT 16.4* 15.5*  INR 1.3* 1.2      Assessment/Plan: -Elevated LFTs in setting of alcohol use.  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  AST more than ALT.  Normal alk phos.  T. bili 8.8 today.  INR 1.3  Discriminant function score of less than 32. 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 -Acute pancreatitis.  Likely from alcohol use.  No biliary ductal dilation or choledocholithiasis on MRI MRCP.  Gallstones seen on imaging.  Recommendations ------------------------ -Patient continues to feel better.  LFTs continues to improve.  INR normal now. -Absolute alcohol abstinence discussed with the patient. -No further inpatient GI workup planned. Patient will need repeat LFTs in 1 to 2 weeks with PCP. -Follow-up in GI clinic in 4 to 8 weeks after discharge.  GI will  sign off.  Call us back if needed.  Kathi Der MD, FACP 04/05/2023, 11:31 AM  Contact #  [phone]

## 2023-04-05 NOTE — Progress Notes (Signed)
Patient was given discharge instructions, and all questions were answered.  Patient was stable for discharge and was taken to the main exit by wheelchair. 

## 2023-04-05 NOTE — Progress Notes (Addendum)
Left message for Brian [name] to see who was going to pick up Brian [name].   Called Brian [name] again with no answer.

## 2023-04-06 LAB — CULTURE, BLOOD (ROUTINE X 2)
Culture: NO GROWTH
Special Requests: ADEQUATE

## 2023-04-08 LAB — CULTURE, BLOOD (ROUTINE X 2)

## 2023-04-24 ENCOUNTER — Inpatient Hospital Stay: Payer: Self-pay | Admitting: Nurse Practitioner

## 2023-05-11 ENCOUNTER — Inpatient Hospital Stay: Payer: Self-pay | Admitting: Nurse Practitioner

## 2023-05-17 ENCOUNTER — Inpatient Hospital Stay: Payer: Self-pay | Admitting: Nurse Practitioner

## 2023-05-22 ENCOUNTER — Inpatient Hospital Stay: Payer: Self-pay | Admitting: Nurse Practitioner

## 2023-10-30 ENCOUNTER — Emergency Department (HOSPITAL_COMMUNITY): Payer: Medicaid Other

## 2023-10-30 ENCOUNTER — Inpatient Hospital Stay (HOSPITAL_COMMUNITY)
Admission: EM | Admit: 2023-10-30 | Discharge: 2023-11-08 | DRG: 070 | Disposition: A | Discharge status: Rehabilitation Facility | Payer: Medicaid Other | Attending: Internal Medicine | Admitting: Internal Medicine

## 2023-10-30 ENCOUNTER — Other Ambulatory Visit: Payer: Self-pay

## 2023-10-30 DIAGNOSIS — R4182 Altered mental status, unspecified: Principal | ICD-10-CM

## 2023-10-30 DIAGNOSIS — I10 Essential (primary) hypertension: Secondary | ICD-10-CM | POA: Diagnosis present

## 2023-10-30 DIAGNOSIS — R7303 Prediabetes: Secondary | ICD-10-CM | POA: Diagnosis present

## 2023-10-30 DIAGNOSIS — E876 Hypokalemia: Secondary | ICD-10-CM | POA: Diagnosis present

## 2023-10-30 DIAGNOSIS — E7220 Disorder of urea cycle metabolism, unspecified: Secondary | ICD-10-CM | POA: Diagnosis present

## 2023-10-30 DIAGNOSIS — E872 Acidosis, unspecified: Secondary | ICD-10-CM | POA: Diagnosis present

## 2023-10-30 DIAGNOSIS — G4733 Obstructive sleep apnea (adult) (pediatric): Secondary | ICD-10-CM | POA: Diagnosis present

## 2023-10-30 DIAGNOSIS — I890 Lymphedema, not elsewhere classified: Secondary | ICD-10-CM | POA: Diagnosis present

## 2023-10-30 DIAGNOSIS — Z8719 Personal history of other diseases of the digestive system: Secondary | ICD-10-CM

## 2023-10-30 DIAGNOSIS — Z6837 Body mass index (BMI) 37.0-37.9, adult: Secondary | ICD-10-CM

## 2023-10-30 DIAGNOSIS — Z79899 Other long term (current) drug therapy: Secondary | ICD-10-CM

## 2023-10-30 DIAGNOSIS — H109 Unspecified conjunctivitis: Secondary | ICD-10-CM | POA: Diagnosis not present

## 2023-10-30 DIAGNOSIS — E43 Unspecified severe protein-calorie malnutrition: Secondary | ICD-10-CM | POA: Diagnosis present

## 2023-10-30 DIAGNOSIS — E079 Disorder of thyroid, unspecified: Secondary | ICD-10-CM | POA: Diagnosis present

## 2023-10-30 DIAGNOSIS — G9341 Metabolic encephalopathy: Principal | ICD-10-CM | POA: Diagnosis present

## 2023-10-30 DIAGNOSIS — Y92009 Unspecified place in unspecified non-institutional (private) residence as the place of occurrence of the external cause: Secondary | ICD-10-CM

## 2023-10-30 DIAGNOSIS — F10239 Alcohol dependence with withdrawal, unspecified: Secondary | ICD-10-CM | POA: Diagnosis present

## 2023-10-30 DIAGNOSIS — N400 Enlarged prostate without lower urinary tract symptoms: Secondary | ICD-10-CM | POA: Diagnosis present

## 2023-10-30 DIAGNOSIS — K7682 Hepatic encephalopathy: Secondary | ICD-10-CM | POA: Insufficient documentation

## 2023-10-30 DIAGNOSIS — K381 Appendicular concretions: Secondary | ICD-10-CM | POA: Diagnosis present

## 2023-10-30 DIAGNOSIS — K760 Fatty (change of) liver, not elsewhere classified: Secondary | ICD-10-CM | POA: Diagnosis present

## 2023-10-30 DIAGNOSIS — R0682 Tachypnea, not elsewhere classified: Secondary | ICD-10-CM | POA: Diagnosis present

## 2023-10-30 DIAGNOSIS — D696 Thrombocytopenia, unspecified: Secondary | ICD-10-CM | POA: Diagnosis present

## 2023-10-30 DIAGNOSIS — E041 Nontoxic single thyroid nodule: Secondary | ICD-10-CM | POA: Diagnosis present

## 2023-10-30 DIAGNOSIS — A419 Sepsis, unspecified organism: Principal | ICD-10-CM | POA: Diagnosis not present

## 2023-10-30 DIAGNOSIS — F17210 Nicotine dependence, cigarettes, uncomplicated: Secondary | ICD-10-CM | POA: Diagnosis present

## 2023-10-30 DIAGNOSIS — S30810A Abrasion of lower back and pelvis, initial encounter: Secondary | ICD-10-CM | POA: Diagnosis present

## 2023-10-30 DIAGNOSIS — K560 Paralytic ileus: Secondary | ICD-10-CM | POA: Diagnosis present

## 2023-10-30 HISTORY — DX: Morbid (severe) obesity due to excess calories: E66.01

## 2023-10-30 HISTORY — DX: Essential (primary) hypertension: I10

## 2023-10-30 HISTORY — DX: Alcohol abuse, uncomplicated: F10.10

## 2023-10-30 HISTORY — DX: Alcohol induced acute pancreatitis without necrosis or infection: K85.20

## 2023-10-30 LAB — CBG MONITORING, ED: Glucose-Capillary: 150 mg/dL — ABNORMAL HIGH (ref 70–99)

## 2023-10-30 MED ORDER — LACTATED RINGERS IV BOLUS
1000.0000 mL | Freq: Once | INTRAVENOUS | Status: AC
  Administered 2023-10-31: 1000 mL via INTRAVENOUS

## 2023-10-30 NOTE — ED Provider Notes (Signed)
[place] EMERGENCY DEPARTMENT AT [facility] Provider Note   CSN: [id number] Arrival date & time: 10/30/23  2317     History {Add pertinent medical, surgical, social history, OB history to HPI:1} Chief Complaint  Patient presents with   Altered Mental Status    Brian [name] is a 62 y.o. male.  Brought in by EMS secondary to decreased responsiveness.  Reportedly he was last known to be well at 8 PM on Sunday evening.  Wife found him sit on the couch in his own urine and feces not responding tonight and called EMS.  With EMS he was tachycardic and warm to touch, tachypneic would only say "yeah" to all questions.  Did not flinch with IV or any kind of pain.  Fire stated his room air saturation 87%, EMS it was 92 with them but because of the tachypnea started on oxygen.  Attempted to call wife for collateral information no answer.  Chart review shows he does have a history of pancreatitis, cholelithiasis, alcoholic ketoacidosis, thyroid nodule.  No further history unable to be obtained at this time.   Altered Mental Status      Home Medications Prior to Admission medications   Medication Sig Start Date End Date Taking? Authorizing Provider  amLODipine (NORVASC) 10 MG tablet Take 1 tablet (10 mg total) by mouth daily. 04/06/23   Alwyn Ren, MD  Multiple Vitamins-Minerals (MULTIVITAMIN WITH MINERALS) tablet Take 1 tablet by mouth daily.    [provider]  Omega-3 Fatty Acids (FISH OIL) 1000 MG CAPS Take 2,000 mg by mouth daily.    [provider]  thiamine (VITAMIN B-1) 100 MG tablet Take 1 tablet (100 mg total) by mouth daily. 04/06/23   Alwyn Ren, MD      Allergies    Patient has no known allergies.    Review of Systems   Review of Systems  Physical Exam Updated Vital Signs There were no vitals taken for this visit. Physical Exam Vitals and nursing note reviewed.  Constitutional:      Appearance: He is well-developed.   HENT:     Head: Normocephalic and atraumatic.  Eyes:     General: Scleral icterus present.     Extraocular Movements: Extraocular movements intact.  Cardiovascular:     Rate and Rhythm: Tachycardia present.  Pulmonary:     Effort: Pulmonary effort is normal. Tachypnea present. No respiratory distress.  Abdominal:     General: There is distension.  Musculoskeletal:        General: Swelling present. Normal range of motion.     Cervical back: Normal range of motion.  Skin:    Coloration: Skin is jaundiced.  Neurological:     Mental Status: He is alert. He is disoriented.     ED Results / Procedures / Treatments   Labs (all labs ordered are listed, but only abnormal results are displayed) Labs Reviewed  CULTURE, BLOOD (ROUTINE X 2)  CULTURE, BLOOD (ROUTINE X 2)  COMPREHENSIVE METABOLIC PANEL  CBC WITH DIFFERENTIAL/PLATELET  PROTIME-INR  URINALYSIS, W/ REFLEX TO CULTURE (INFECTION SUSPECTED)  AMMONIA  I-STAT CG4 LACTIC ACID, ED  TROPONIN I (HIGH SENSITIVITY)    EKG None  Radiology No results found.  Procedures Procedures  {Document cardiac monitor, telemetry assessment procedure when appropriate:1}  Medications Ordered in ED Medications - No data to display  ED Course/ Medical Decision Making/ A&P   {   Click here for ABCD2, HEART and other calculatorsREFRESH Note before signing :  1}                              Medical Decision Making Amount and/or Complexity of Data Reviewed Labs: ordered. Radiology: ordered.  Patient very well could be septic versus stroke versus gallstone pancreatitis/alcohol induced pancreatitis.  Currently is protecting his airway will start with CT scan labs and some fluids as he is dry. Will attempt to contact wife again.  ***  {Document critical care time when appropriate:1} {Document review of labs and clinical decision tools ie heart score, Chads2Vasc2 etc:1}  {Document your independent review of radiology images, and any outside  records:1} {Document your discussion with family members, caretakers, and with consultants:1} {Document social determinants of health affecting pt's care:1} {Document your decision making why or why not admission, treatments were needed:1} Final Clinical Impression(s) / ED Diagnoses Final diagnoses:  None    Rx / DC Orders ED Discharge Orders     None

## 2023-10-30 NOTE — ED Triage Notes (Signed)
Pt comes from home. Last known well 2000 per wife. He had been on the couch sitting in stool and urine. He is warm to the touch, altered. GCS 9 per EMS. Likely septic.

## 2023-10-31 ENCOUNTER — Inpatient Hospital Stay (HOSPITAL_COMMUNITY): Payer: Medicaid Other

## 2023-10-31 ENCOUNTER — Other Ambulatory Visit: Payer: Self-pay

## 2023-10-31 ENCOUNTER — Encounter (HOSPITAL_COMMUNITY): Payer: Self-pay | Admitting: Pulmonary Disease

## 2023-10-31 DIAGNOSIS — R569 Unspecified convulsions: Secondary | ICD-10-CM

## 2023-10-31 DIAGNOSIS — I10 Essential (primary) hypertension: Secondary | ICD-10-CM | POA: Diagnosis present

## 2023-10-31 DIAGNOSIS — Y92009 Unspecified place in unspecified non-institutional (private) residence as the place of occurrence of the external cause: Secondary | ICD-10-CM | POA: Diagnosis not present

## 2023-10-31 DIAGNOSIS — G9341 Metabolic encephalopathy: Secondary | ICD-10-CM | POA: Diagnosis present

## 2023-10-31 DIAGNOSIS — F10239 Alcohol dependence with withdrawal, unspecified: Secondary | ICD-10-CM | POA: Diagnosis present

## 2023-10-31 DIAGNOSIS — F17210 Nicotine dependence, cigarettes, uncomplicated: Secondary | ICD-10-CM | POA: Diagnosis present

## 2023-10-31 DIAGNOSIS — K560 Paralytic ileus: Secondary | ICD-10-CM | POA: Diagnosis present

## 2023-10-31 DIAGNOSIS — Z6837 Body mass index (BMI) 37.0-37.9, adult: Secondary | ICD-10-CM | POA: Diagnosis not present

## 2023-10-31 DIAGNOSIS — Z8719 Personal history of other diseases of the digestive system: Secondary | ICD-10-CM | POA: Diagnosis not present

## 2023-10-31 DIAGNOSIS — I890 Lymphedema, not elsewhere classified: Secondary | ICD-10-CM | POA: Diagnosis present

## 2023-10-31 DIAGNOSIS — E876 Hypokalemia: Secondary | ICD-10-CM | POA: Diagnosis present

## 2023-10-31 DIAGNOSIS — A419 Sepsis, unspecified organism: Principal | ICD-10-CM | POA: Diagnosis present

## 2023-10-31 DIAGNOSIS — E872 Acidosis, unspecified: Secondary | ICD-10-CM | POA: Diagnosis present

## 2023-10-31 DIAGNOSIS — E7220 Disorder of urea cycle metabolism, unspecified: Secondary | ICD-10-CM | POA: Diagnosis present

## 2023-10-31 DIAGNOSIS — E041 Nontoxic single thyroid nodule: Secondary | ICD-10-CM | POA: Diagnosis present

## 2023-10-31 DIAGNOSIS — K7682 Hepatic encephalopathy: Secondary | ICD-10-CM | POA: Insufficient documentation

## 2023-10-31 DIAGNOSIS — D696 Thrombocytopenia, unspecified: Secondary | ICD-10-CM | POA: Diagnosis present

## 2023-10-31 DIAGNOSIS — N400 Enlarged prostate without lower urinary tract symptoms: Secondary | ICD-10-CM | POA: Diagnosis present

## 2023-10-31 DIAGNOSIS — R7303 Prediabetes: Secondary | ICD-10-CM | POA: Diagnosis present

## 2023-10-31 DIAGNOSIS — R0682 Tachypnea, not elsewhere classified: Secondary | ICD-10-CM | POA: Diagnosis present

## 2023-10-31 DIAGNOSIS — H109 Unspecified conjunctivitis: Secondary | ICD-10-CM | POA: Diagnosis not present

## 2023-10-31 DIAGNOSIS — S30810A Abrasion of lower back and pelvis, initial encounter: Secondary | ICD-10-CM | POA: Diagnosis present

## 2023-10-31 DIAGNOSIS — R4182 Altered mental status, unspecified: Secondary | ICD-10-CM

## 2023-10-31 DIAGNOSIS — R5381 Other malaise: Secondary | ICD-10-CM | POA: Diagnosis not present

## 2023-10-31 DIAGNOSIS — E079 Disorder of thyroid, unspecified: Secondary | ICD-10-CM | POA: Diagnosis present

## 2023-10-31 DIAGNOSIS — K760 Fatty (change of) liver, not elsewhere classified: Secondary | ICD-10-CM | POA: Diagnosis present

## 2023-10-31 DIAGNOSIS — K381 Appendicular concretions: Secondary | ICD-10-CM | POA: Diagnosis present

## 2023-10-31 DIAGNOSIS — G4733 Obstructive sleep apnea (adult) (pediatric): Secondary | ICD-10-CM | POA: Diagnosis present

## 2023-10-31 DIAGNOSIS — E43 Unspecified severe protein-calorie malnutrition: Secondary | ICD-10-CM | POA: Diagnosis present

## 2023-10-31 DIAGNOSIS — R9431 Abnormal electrocardiogram [ECG] [EKG]: Secondary | ICD-10-CM

## 2023-10-31 LAB — URINALYSIS, W/ REFLEX TO CULTURE (INFECTION SUSPECTED)
Glucose, UA: NEGATIVE mg/dL
Ketones, ur: NEGATIVE mg/dL
Leukocytes,Ua: NEGATIVE
Nitrite: NEGATIVE
Protein, ur: NEGATIVE mg/dL
Specific Gravity, Urine: 1.024 (ref 1.005–1.030)
pH: 5 (ref 5.0–8.0)

## 2023-10-31 LAB — GLUCOSE, CAPILLARY
Glucose-Capillary: 114 mg/dL — ABNORMAL HIGH (ref 70–99)
Glucose-Capillary: 133 mg/dL — ABNORMAL HIGH (ref 70–99)
Glucose-Capillary: 124 mg/dL — ABNORMAL HIGH (ref 70–99)
Glucose-Capillary: 108 mg/dL — ABNORMAL HIGH (ref 70–99)
Glucose-Capillary: 115 mg/dL — ABNORMAL HIGH (ref 70–99)
Glucose-Capillary: 97 mg/dL (ref 70–99)

## 2023-10-31 LAB — I-STAT VENOUS BLOOD GAS, ED
Acid-Base Excess: 3 mmol/L — ABNORMAL HIGH (ref 0.0–2.0)
Bicarbonate: 25.7 mmol/L (ref 20.0–28.0)
Calcium, Ion: 1.09 mmol/L — ABNORMAL LOW (ref 1.15–1.40)
HCT: 47 % (ref 39.0–52.0)
Hemoglobin: 16 g/dL (ref 13.0–17.0)
O2 Saturation: 98 %
Potassium: 2.9 mmol/L — ABNORMAL LOW (ref 3.5–5.1)
Sodium: 142 mmol/L (ref 135–145)
TCO2: 27 mmol/L (ref 22–32)
pCO2, Ven: 33 mm[Hg] — ABNORMAL LOW (ref 44–60)
pH, Ven: 7.499 — ABNORMAL HIGH (ref 7.25–7.43)
pO2, Ven: 101 mm[Hg] — ABNORMAL HIGH (ref 32–45)

## 2023-10-31 LAB — VITAMIN B12: Vitamin B-12: 439 pg/mL (ref 180–914)

## 2023-10-31 LAB — I-STAT CG4 LACTIC ACID, ED
Lactic Acid, Venous: 3.8 mmol/L (ref 0.5–1.9)
Lactic Acid, Venous: >15 mmol/L (ref 0.5–1.9)

## 2023-10-31 LAB — CBC WITH DIFFERENTIAL/PLATELET
Abs Immature Granulocytes: 0.05 10*3/uL (ref 0.00–0.07)
Basophils Absolute: 0 10*3/uL (ref 0.0–0.1)
Basophils Relative: 1 %
Eosinophils Absolute: 0.1 10*3/uL (ref 0.0–0.5)
Eosinophils Relative: 1 %
HCT: 45 % (ref 39.0–52.0)
Hemoglobin: 15.3 g/dL (ref 13.0–17.0)
Immature Granulocytes: 1 %
Lymphocytes Relative: 11 %
Lymphs Abs: 0.8 10*3/uL (ref 0.7–4.0)
MCH: 34.2 pg — ABNORMAL HIGH (ref 26.0–34.0)
MCHC: 34 g/dL (ref 30.0–36.0)
MCV: 100.7 fL — ABNORMAL HIGH (ref 80.0–100.0)
Monocytes Absolute: 0.6 10*3/uL (ref 0.1–1.0)
Monocytes Relative: 9 %
Neutro Abs: 5.4 10*3/uL (ref 1.7–7.7)
Neutrophils Relative %: 77 %
Platelets: 162 10*3/uL (ref 150–400)
RBC: 4.47 MIL/uL (ref 4.22–5.81)
RDW: 15.4 % (ref 11.5–15.5)
WBC: 6.9 10*3/uL (ref 4.0–10.5)
nRBC: 0 % (ref 0.0–0.2)

## 2023-10-31 LAB — TSH: TSH: 0.162 u[IU]/mL — ABNORMAL LOW (ref 0.350–4.500)

## 2023-10-31 LAB — COMPREHENSIVE METABOLIC PANEL
ALT: 22 U/L (ref 0–44)
ALT: 25 U/L (ref 0–44)
AST: 39 U/L (ref 15–41)
AST: 44 U/L — ABNORMAL HIGH (ref 15–41)
Albumin: 2.6 g/dL — ABNORMAL LOW (ref 3.5–5.0)
Albumin: 2.8 g/dL — ABNORMAL LOW (ref 3.5–5.0)
Alkaline Phosphatase: 66 U/L (ref 38–126)
Alkaline Phosphatase: 75 U/L (ref 38–126)
Anion gap: 13 (ref 5–15)
Anion gap: 16 — ABNORMAL HIGH (ref 5–15)
BUN: 19 mg/dL (ref 6–20)
BUN: 25 mg/dL — ABNORMAL HIGH (ref 6–20)
CO2: 20 mmol/L — ABNORMAL LOW (ref 22–32)
CO2: 21 mmol/L — ABNORMAL LOW (ref 22–32)
Calcium: 9.2 mg/dL (ref 8.9–10.3)
Calcium: 9.5 mg/dL (ref 8.9–10.3)
Chloride: 101 mmol/L (ref 98–111)
Chloride: 106 mmol/L (ref 98–111)
Creatinine, Ser: 0.87 mg/dL (ref 0.61–1.24)
Creatinine, Ser: 1.12 mg/dL (ref 0.61–1.24)
GFR, Estimated: >60 mL/min (ref >60)
GFR, Estimated: >60 mL/min (ref >60)
Glucose, Bld: 120 mg/dL — ABNORMAL HIGH (ref 70–99)
Glucose, Bld: 152 mg/dL — ABNORMAL HIGH (ref 70–99)
Potassium: 3 mmol/L — ABNORMAL LOW (ref 3.5–5.1)
Potassium: 3.3 mmol/L — ABNORMAL LOW (ref 3.5–5.1)
Sodium: 137 mmol/L (ref 135–145)
Sodium: 140 mmol/L (ref 135–145)
Total Bilirubin: 9.3 mg/dL — ABNORMAL HIGH (ref <1.2)
Total Bilirubin: 9.4 mg/dL — ABNORMAL HIGH (ref <1.2)
Total Protein: 8 g/dL (ref 6.5–8.1)
Total Protein: 8.7 g/dL — ABNORMAL HIGH (ref 6.5–8.1)

## 2023-10-31 LAB — ECHOCARDIOGRAM COMPLETE
AR max vel: 3.19 cm2
AV Area VTI: 3.42 cm2
AV Area mean vel: 3.21 cm2
AV Mean grad: 4 mm[Hg]
AV Peak grad: 6.9 mm[Hg]
Ao pk vel: 1.31 m/s
Area-P 1/2: 3.45 cm2
Height: 72 in
S' Lateral: 3.5 cm
Weight: 4359.82 [oz_av]

## 2023-10-31 LAB — T4, FREE: Free T4: 0.96 ng/dL (ref 0.61–1.12)

## 2023-10-31 LAB — LACTIC ACID, PLASMA
Lactic Acid, Venous: 0.8 mmol/L (ref 0.5–1.9)
Lactic Acid, Venous: 1.1 mmol/L (ref 0.5–1.9)

## 2023-10-31 LAB — CBG MONITORING, ED: Glucose-Capillary: 102 mg/dL — ABNORMAL HIGH (ref 70–99)

## 2023-10-31 LAB — PROCALCITONIN: Procalcitonin: <0.1 ng/mL

## 2023-10-31 LAB — MRSA NEXT GEN BY PCR, NASAL: MRSA by PCR Next Gen: NOT DETECTED

## 2023-10-31 LAB — PROTIME-INR
INR: 1.4 — ABNORMAL HIGH (ref 0.8–1.2)
Prothrombin Time: 17.7 s — ABNORMAL HIGH (ref 11.4–15.2)

## 2023-10-31 LAB — STREP PNEUMONIAE URINARY ANTIGEN: Strep Pneumo Urinary Antigen: NEGATIVE

## 2023-10-31 LAB — LIPASE, BLOOD: Lipase: 35 U/L (ref 11–51)

## 2023-10-31 LAB — ETHANOL: Alcohol, Ethyl (B): <10 mg/dL (ref <10)

## 2023-10-31 LAB — AMMONIA: Ammonia: 42 umol/L — ABNORMAL HIGH (ref 9–35)

## 2023-10-31 LAB — SALICYLATE LEVEL: Salicylate Lvl: <7 mg/dL — ABNORMAL LOW (ref 7.0–30.0)

## 2023-10-31 LAB — ACETAMINOPHEN LEVEL: Acetaminophen (Tylenol), Serum: <10 ug/mL — ABNORMAL LOW (ref 10–30)

## 2023-10-31 LAB — MAGNESIUM: Magnesium: 2.3 mg/dL (ref 1.7–2.4)

## 2023-10-31 LAB — TROPONIN I (HIGH SENSITIVITY)
Troponin I (High Sensitivity): 15 ng/L (ref <18)
Troponin I (High Sensitivity): 6 ng/L (ref <18)

## 2023-10-31 MED ORDER — LORAZEPAM 2 MG/ML IJ SOLN: 1.0000 mg | INTRAMUSCULAR | Status: DC | PRN

## 2023-10-31 MED ORDER — INSULIN ASPART 100 UNIT/ML IJ SOLN
0.0000 [IU] | INTRAMUSCULAR | Status: DC
  Administered 2023-10-31 – 2023-11-03 (×4): 1 [IU] via SUBCUTANEOUS
  Administered 2023-11-03: 3 [IU] via SUBCUTANEOUS
  Administered 2023-11-04 – 2023-11-06 (×10): 1 [IU] via SUBCUTANEOUS
  Administered 2023-11-07 (×2): 2 [IU] via SUBCUTANEOUS
  Administered 2023-11-07: 1 [IU] via SUBCUTANEOUS
  Administered 2023-11-08: 3 [IU] via SUBCUTANEOUS

## 2023-10-31 MED ORDER — ADULT MULTIVITAMIN W/MINERALS CH
1.0000 | ORAL_TABLET | Freq: Every day | ORAL | Status: DC
Start: 2023-10-31 — End: 2023-10-31

## 2023-10-31 MED ORDER — LACTULOSE 10 GM/15ML PO SOLN: 10.0000 g | Freq: Two times a day (BID) | ORAL | Status: DC

## 2023-10-31 MED ORDER — LORAZEPAM 2 MG/ML IJ SOLN
1.0000 mg | INTRAMUSCULAR | Status: DC | PRN
  Administered 2023-10-31: 4 mg via INTRAVENOUS
  Filled 2023-10-31: qty 2

## 2023-10-31 MED ORDER — FOLIC ACID 1 MG PO TABS: 1.0000 mg | ORAL_TABLET | Freq: Every day | ORAL | Status: DC

## 2023-10-31 MED ORDER — IPRATROPIUM-ALBUTEROL 0.5-2.5 (3) MG/3ML IN SOLN: 3.0000 mL | Freq: Four times a day (QID) | RESPIRATORY_TRACT | Status: DC | PRN

## 2023-10-31 MED ORDER — PHENOBARBITAL 32.4 MG PO TABS
32.4000 mg | ORAL_TABLET | Freq: Three times a day (TID) | ORAL | Status: AC
  Administered 2023-11-02 – 2023-11-04 (×6): 32.4 mg
  Filled 2023-10-31 (×6): qty 1

## 2023-10-31 MED ORDER — POTASSIUM CHLORIDE 10 MEQ/100ML IV SOLN
10.0000 meq | INTRAVENOUS | Status: AC
  Administered 2023-10-31 (×5): 10 meq via INTRAVENOUS
  Filled 2023-10-31 (×5): qty 100

## 2023-10-31 MED ORDER — POTASSIUM CHLORIDE 2 MEQ/ML IV SOLN
INTRAVENOUS | Status: DC
  Filled 2023-10-31: qty 1000

## 2023-10-31 MED ORDER — ADULT MULTIVITAMIN W/MINERALS CH: 1.0000 | ORAL_TABLET | Freq: Every day | ORAL | Status: DC

## 2023-10-31 MED ORDER — POTASSIUM CHLORIDE 2 MEQ/ML IV SOLN
INTRAVENOUS | Status: AC
Start: 2023-10-31 — End: 2023-10-31
  Filled 2023-10-31: qty 1000

## 2023-10-31 MED ORDER — PHENOBARBITAL 32.4 MG PO TABS
64.8000 mg | ORAL_TABLET | Freq: Three times a day (TID) | ORAL | Status: AC
  Administered 2023-10-31 – 2023-11-02 (×6): 64.8 mg
  Filled 2023-10-31 (×7): qty 2

## 2023-10-31 MED ORDER — POTASSIUM CHLORIDE CRYS ER 20 MEQ PO TBCR: 40.0000 meq | EXTENDED_RELEASE_TABLET | Freq: Once | ORAL | Status: DC

## 2023-10-31 MED ORDER — POLYETHYLENE GLYCOL 3350 17 G PO PACK: 17.0000 g | PACK | Freq: Every day | ORAL | Status: DC | PRN

## 2023-10-31 MED ORDER — VANCOMYCIN HCL 2000 MG/400ML IV SOLN
2000.0000 mg | Freq: Once | INTRAVENOUS | Status: AC
  Administered 2023-10-31: 2000 mg via INTRAVENOUS
  Filled 2023-10-31: qty 400

## 2023-10-31 MED ORDER — DOCUSATE SODIUM 100 MG PO CAPS: 100.0000 mg | ORAL_CAPSULE | Freq: Two times a day (BID) | ORAL | Status: DC | PRN

## 2023-10-31 MED ORDER — PIPERACILLIN-TAZOBACTAM 3.375 G IVPB 30 MIN: 3.3750 g | Freq: Four times a day (QID) | INTRAVENOUS | Status: DC

## 2023-10-31 MED ORDER — NAPHAZOLINE-GLYCERIN 0.012-0.25 % OP SOLN: 1.0000 [drp] | Freq: Four times a day (QID) | OPHTHALMIC | Status: DC | PRN

## 2023-10-31 MED ORDER — ASPIRIN 325 MG PO TABS: 325.0000 mg | ORAL_TABLET | Freq: Every day | ORAL | Status: DC

## 2023-10-31 MED ORDER — LORAZEPAM 1 MG PO TABS: 1.0000 mg | ORAL_TABLET | ORAL | Status: DC | PRN

## 2023-10-31 MED ORDER — LACTULOSE 10 GM/15ML PO SOLN
10.0000 g | Freq: Two times a day (BID) | ORAL | Status: AC
  Administered 2023-10-31 – 2023-11-01 (×4): 10 g
  Filled 2023-10-31 (×2): qty 15
  Filled 2023-10-31: qty 30
  Filled 2023-10-31: qty 15

## 2023-10-31 MED ORDER — THIAMINE MONONITRATE 100 MG PO TABS
100.0000 mg | ORAL_TABLET | Freq: Every day | ORAL | Status: DC
Start: 2023-10-31 — End: 2023-10-31

## 2023-10-31 MED ORDER — GERHARDT'S BUTT CREAM
TOPICAL_CREAM | Freq: Three times a day (TID) | CUTANEOUS | Status: DC | PRN
  Filled 2023-10-31 (×2): qty 1

## 2023-10-31 MED ORDER — THIAMINE HCL 100 MG/ML IJ SOLN
100.0000 mg | Freq: Every day | INTRAMUSCULAR | Status: DC
  Administered 2023-10-31: 100 mg via INTRAVENOUS
  Filled 2023-10-31: qty 2

## 2023-10-31 MED ORDER — MAGNESIUM SULFATE 2 GM/50ML IV SOLN
2.0000 g | Freq: Once | INTRAVENOUS | Status: AC
  Administered 2023-10-31: 2 g via INTRAVENOUS
  Filled 2023-10-31: qty 50

## 2023-10-31 MED ORDER — PIPERACILLIN-TAZOBACTAM 3.375 G IVPB
3.3750 g | Freq: Three times a day (TID) | INTRAVENOUS | Status: DC
  Administered 2023-10-31 – 2023-11-01 (×3): 3.375 g via INTRAVENOUS
  Filled 2023-10-31 (×4): qty 50

## 2023-10-31 MED ORDER — ENOXAPARIN SODIUM 60 MG/0.6ML IJ SOSY
60.0000 mg | PREFILLED_SYRINGE | INTRAMUSCULAR | Status: DC
  Administered 2023-11-01 – 2023-11-08 (×8): 60 mg via SUBCUTANEOUS
  Filled 2023-10-31 (×8): qty 0.6

## 2023-10-31 MED ORDER — SODIUM CHLORIDE 0.9 % IV SOLN
2.0000 g | Freq: Once | INTRAVENOUS | Status: AC
  Administered 2023-10-31: 2 g via INTRAVENOUS

## 2023-10-31 MED ORDER — POTASSIUM CHLORIDE 20 MEQ PO PACK
40.0000 meq | PACK | Freq: Once | ORAL | Status: AC
  Administered 2023-10-31: 40 meq
  Filled 2023-10-31: qty 2

## 2023-10-31 MED ORDER — IOPAMIDOL (ISOVUE-370) INJECTION 76%
100.0000 mL | Freq: Once | INTRAVENOUS | Status: AC | PRN
  Administered 2023-10-31: 100 mL via INTRAVENOUS

## 2023-10-31 MED ORDER — CHLORHEXIDINE GLUCONATE CLOTH 2 % EX PADS
6.0000 | MEDICATED_PAD | Freq: Every day | CUTANEOUS | Status: DC
Start: 2023-10-31 — End: 2023-11-08
  Administered 2023-10-31 – 2023-11-08 (×9): 6 via TOPICAL

## 2023-10-31 MED ORDER — ENOXAPARIN SODIUM 40 MG/0.4ML IJ SOSY
40.0000 mg | PREFILLED_SYRINGE | INTRAMUSCULAR | Status: DC
Start: 2023-10-31 — End: 2023-10-31
  Administered 2023-10-31: 40 mg via SUBCUTANEOUS
  Filled 2023-10-31: qty 0.4

## 2023-10-31 MED ORDER — ORAL CARE MOUTH RINSE: 15.0000 mL | OROMUCOSAL | Status: DC | PRN

## 2023-10-31 NOTE — Plan of Care (Signed)

## 2023-10-31 NOTE — Procedures (Signed)
Patient Name: MARKESE ILSLEY  MRN: [id number]  Epilepsy Attending: Charlsie Quest  Referring Physician/Provider: Rocky Morel, DO  Date: 10/31/2023 Duration: 23.56 mins  Patient history: 62yo M with ams getting eeg to evaluate for seizure  Level of alertness: Awake/ lethargic   AEDs during EEG study: Ativan  Technical aspects: This EEG study was done with scalp electrodes positioned according to the 10-20 International system of electrode placement. Electrical activity was reviewed with band pass filter of 1-70Hz , sensitivity of 7 uV/mm, display speed of 45mm/sec with a 60Hz  notched filter applied as appropriate. EEG data were recorded continuously and digitally stored.  Video monitoring was available and reviewed as appropriate.  Description: No posterior dominant rhythm was noted. EEG showed continuous generalized 3 to 5 Hz theta-delta slowing, at times with triphasic morphology. Hyperventilation and photic stimulation were not performed.     ABNORMALITY - Continuous slow, generalized  IMPRESSION: This study is suggestive of moderate to severe diffuse encephalopathy. No seizures or epileptiform discharges were seen throughout the recording.  Stephinie Battisti Annabelle Harman

## 2023-10-31 NOTE — Consult Note (Signed)
Brian [name] 10-29-1961  [id number].    Requesting MD: Dr. Melody Comas Chief Complaint/Reason for Consult: possible appendicitis  HPI:  This is a 62 yo black male with a history of ETOH abuse, ETOH pancreatitis, HTN, morbid obesity, who appears to have a MELD of 22, Child Pugh C score who is encephalopathic and admitted overnight after his wife found him in his own urine and feces and lethargic at home.  Information is obtained from the chart as no family is present currently.  Not much other information is known as attempts by the ED and primary service to contact the wife have been unsuccessful thus far.  He is AF with a normal WBC. His ammonia is 42, lactic acid is 1.1, INR 1.4, TB 9.4 (which is his baseline), Cr 1.12.  He underwent pan scans last night to try and identify a possible source of his encephalopathy.  CT abdomen/pel revealed severe hepatic steatosis with hepatomegaly, diffuse dilatation of his SB and colon c/w ileus, and potential appendicitis given some dilatation and slightly inflamed appearing appendix.  He does have appendicoliths present in the appendix itself.  We have been asked to see the patient for further evaluation.  ROS: ROS: unable due to lethargy and altered mental status  History reviewed. No pertinent family history.  Past Medical History:  Diagnosis Date   Alcoholic pancreatitis    ETOH abuse    HTN (hypertension)    Hypertension    Morbid obesity (HCC)     History reviewed. No pertinent surgical history.  Social History:  reports that he has been smoking cigarettes. He does not have any smokeless tobacco history on file. No history on file for alcohol use and drug use.  Allergies: No Known Allergies  Medications Prior to Admission  Medication Sig Dispense Refill   amLODipine (NORVASC) 10 MG tablet Take 1 tablet (10 mg total) by mouth daily. 30 tablet 2   Multiple Vitamins-Minerals (MULTIVITAMIN WITH MINERALS) tablet Take 1 tablet by  mouth daily.     Omega-3 Fatty Acids (FISH OIL) 1000 MG CAPS Take 2,000 mg by mouth daily.     thiamine (VITAMIN B-1) 100 MG tablet Take 1 tablet (100 mg total) by mouth daily.       Physical Exam: Blood pressure (!) 142/80, pulse (!) 103, temperature 98.3 F (36.8 C), temperature source Oral, resp. rate 20, height 6' (1.829 m), weight 123.6 kg, SpO2 96%. General: lethargic, somewhat arouses to loud voice and name, NAD HEENT: head is normocephalic, atraumatic with EEG leads in place.  Won't really open his eyes.  Ears and nose without any masses or lesions.  Mouth is pink and dry Heart: regular, rate, and rhythm.  Normal s1,s2. No obvious murmurs, gallops, or rubs noted.   Lungs: CTAB, no wheezes, rhonchi, or rales noted.  Respiratory effort nonlabored Abd: soft, NT, specifically nontender in RLQ.  Patient did arouse for me when I was examining his abdomen and mumbled "no" to abdominal pain to palpation, distended with tympany, hypoactive BS, no masses noted.  Small reducible umbilical hernia noted. Psych: lethargic, awakens slightly to loud voice and name for a brief period of time.   Results for orders placed or performed during the hospital encounter of 10/30/23 (from the past 48 hour(s))  Culture, blood (Routine x 2)     Status: None (Preliminary result)   Collection Time: 10/30/23 11:34 PM   Specimen: BLOOD LEFT ARM  Result Value Ref Range   Specimen Description  BLOOD LEFT ARM    Special Requests      BOTTLES DRAWN AEROBIC AND ANAEROBIC Blood Culture adequate volume   Culture      NO GROWTH < 12 HOURS Performed at [facility], [address] [place], [place] [address]    Report Status PENDING   Culture, blood (Routine x 2)     Status: None (Preliminary result)   Collection Time: 10/30/23 11:34 PM   Specimen: BLOOD RIGHT ARM  Result Value Ref Range   Specimen Description BLOOD RIGHT ARM    Special Requests      BOTTLES DRAWN AEROBIC AND ANAEROBIC Blood Culture adequate  volume   Culture      NO GROWTH < 12 HOURS Performed at [facility], [address] [place], [place] [address]    Report Status PENDING   Comprehensive metabolic panel     Status: Abnormal   Collection Time: 10/30/23 11:36 PM  Result Value Ref Range   Sodium 137 135 - 145 mmol/L   Potassium 3.0 (L) 3.5 - 5.1 mmol/L   Chloride 101 98 - 111 mmol/L   CO2 20 (L) 22 - 32 mmol/L   Glucose, Bld 152 (H) 70 - 99 mg/dL    Comment: Glucose reference range applies only to samples taken after fasting for at least 8 hours.   BUN 25 (H) 6 - 20 mg/dL   Creatinine, Ser 0.98 0.61 - 1.24 mg/dL   Calcium 9.5 8.9 - 11.9 mg/dL   Total Protein 8.7 (H) 6.5 - 8.1 g/dL   Albumin 2.8 (L) 3.5 - 5.0 g/dL   AST 44 (H) 15 - 41 U/L   ALT 25 0 - 44 U/L   Alkaline Phosphatase 75 38 - 126 U/L   Total Bilirubin 9.4 (H) <1.2 mg/dL   GFR, Estimated >14 >78 mL/min    Comment: (NOTE) Calculated using the CKD-EPI Creatinine Equation (2021)    Anion gap 16 (H) 5 - 15    Comment: Performed at [facility], [address], [place], [place] [address]  CBC with Differential     Status: Abnormal   Collection Time: 10/30/23 11:36 PM  Result Value Ref Range   WBC 6.9 4.0 - 10.5 K/uL   RBC 4.47 4.22 - 5.81 MIL/uL   Hemoglobin 15.3 13.0 - 17.0 g/dL   HCT 13.0 86.5 - 78.4 %   MCV 100.7 (H) 80.0 - 100.0 fL   MCH 34.2 (H) 26.0 - 34.0 pg   MCHC 34.0 30.0 - 36.0 g/dL   RDW 69.6 29.5 - 28.4 %   Platelets 162 150 - 400 K/uL   nRBC 0.0 0.0 - 0.2 %   Neutrophils Relative % 77 %   Neutro Abs 5.4 1.7 - 7.7 K/uL   Lymphocytes Relative 11 %   Lymphs Abs 0.8 0.7 - 4.0 K/uL   Monocytes Relative 9 %   Monocytes Absolute 0.6 0.1 - 1.0 K/uL   Eosinophils Relative 1 %   Eosinophils Absolute 0.1 0.0 - 0.5 K/uL   Basophils Relative 1 %   Basophils Absolute 0.0 0.0 - 0.1 K/uL   Immature Granulocytes 1 %   Abs Immature Granulocytes 0.05 0.00 - 0.07 K/uL    Comment: Performed at [facility], [address]  [place], [place] [address]  Protime-INR     Status: Abnormal   Collection Time: 10/30/23 11:36 PM  Result Value Ref Range   Prothrombin Time 17.7 (H) 11.4 - 15.2  seconds   INR 1.4 (H) 0.8 - 1.2    Comment: (NOTE) INR goal varies based on device and disease states. Performed at [facility], [address] [place], [place] [address]   Troponin I (High Sensitivity)     Status: None   Collection Time: 10/30/23 11:36 PM  Result Value Ref Range   Troponin I (High Sensitivity) 15 <18 ng/L    Comment: (NOTE) Elevated high sensitivity troponin I (hsTnI) values and significant  changes across serial measurements may suggest ACS but many other  chronic and acute conditions are known to elevate hsTnI results.  Refer to the "Links" section for chest pain algorithms and additional  guidance. Performed at [facility], [address], [place], [place] [address]   Lipase, blood     Status: None   Collection Time: 10/30/23 11:36 PM  Result Value Ref Range   Lipase 35 11 - 51 U/L    Comment: Performed at [facility], [address], [place], [place] [address]  Ethanol     Status: None   Collection Time: 10/30/23 11:36 PM  Result Value Ref Range   Alcohol, Ethyl (B) <10 <10 mg/dL    Comment: (NOTE) Lowest detectable limit for serum alcohol is 10 mg/dL.  For medical purposes only. Performed at [facility], [address], [place], [place] [address]   Salicylate level     Status: Abnormal   Collection Time: 10/30/23 11:36 PM  Result Value Ref Range   Salicylate Lvl <7.0 (L) 7.0 - 30.0 mg/dL    Comment: Performed at [facility], [address], [place], [place] [address]  Acetaminophen level     Status: Abnormal   Collection Time: 10/30/23 11:36 PM  Result Value Ref Range   Acetaminophen (Tylenol), Serum <10 (L) 10 - 30 ug/mL    Comment: (NOTE) Therapeutic concentrations vary significantly. A range of 10-30 ug/mL  may be an effective concentration for many  patients. However, some  are best treated at concentrations outside of this range. Acetaminophen concentrations >150 ug/mL at 4 hours after ingestion  and >50 ug/mL at 12 hours after ingestion are often associated with  toxic reactions.  Performed at [facility], [address], [place], [place] [address]   CBG monitoring, ED     Status: Abnormal   Collection Time: 10/30/23 11:55 PM  Result Value Ref Range   Glucose-Capillary 150 (H) 70 - 99 mg/dL    Comment: Glucose reference range applies only to samples taken after fasting for at least 8 hours.  I-Stat Lactic Acid, ED     Status: Abnormal   Collection Time: 10/31/23 12:09 AM  Result Value Ref Range   Lactic Acid, Venous 3.8 (HH) 0.5 - 1.9 mmol/L   Comment NOTIFIED PHYSICIAN   I-Stat venous blood gas, ([facility] ED, [facility], [facility])     Status: Abnormal   Collection Time: 10/31/23 12:09 AM  Result Value Ref Range   pH, Ven 7.499 (H) 7.25 - 7.43   pCO2, Ven 33.0 (L) 44 - 60 mmHg   pO2, Ven 101 (H) 32 - 45 mmHg   Bicarbonate 25.7 20.0 - 28.0 mmol/L   TCO2 27 22 - 32 mmol/L   O2 Saturation 98 %   Acid-Base Excess 3.0 (H) 0.0 - 2.0 mmol/L   Sodium 142 135 - 145 mmol/L   Potassium 2.9 (L) 3.5 - 5.1 mmol/L   Calcium, Ion 1.09 (L) 1.15 - 1.40  mmol/L   HCT 47.0 39.0 - 52.0 %   Hemoglobin 16.0 13.0 - 17.0 g/dL   Sample type VENOUS   Ammonia     Status: Abnormal   Collection Time: 10/31/23  1:27 AM  Result Value Ref Range   Ammonia 42 (H) 9 - 35 umol/L    Comment: Performed at [facility], [address], [place], [place] [address]  Troponin I (High Sensitivity)     Status: None   Collection Time: 10/31/23  1:27 AM  Result Value Ref Range   Troponin I (High Sensitivity) 6 <18 ng/L    Comment: (NOTE) Elevated high sensitivity troponin I (hsTnI) values and significant  changes across serial measurements may suggest ACS but many other  chronic and acute conditions are known to elevate hsTnI results.  Refer to the "Links" section  for chest pain algorithms and additional  guidance. Performed at [facility], [address], [place], [place] [address]   TSH     Status: Abnormal   Collection Time: 10/31/23  1:27 AM  Result Value Ref Range   TSH 0.162 (L) 0.350 - 4.500 uIU/mL    Comment: Performed by a 3rd Generation assay with a functional sensitivity of <=0.01 uIU/mL. Performed at [facility], [address] [place], [place] [address]   T4, free     Status: None   Collection Time: 10/31/23  1:27 AM  Result Value Ref Range   Free T4 0.96 0.61 - 1.12 ng/dL    Comment: (NOTE) Biotin ingestion may interfere with free T4 tests. If the results are inconsistent with the TSH level, previous test results, or the clinical presentation, then consider biotin interference. If needed, order repeat testing after stopping biotin. Performed at [facility], [address] [place], [place] [address]   Procalcitonin     Status: None   Collection Time: 10/31/23  1:27 AM  Result Value Ref Range   Procalcitonin <0.10 ng/mL    Comment:        Interpretation: PCT (Procalcitonin) <= 0.5 ng/mL: Systemic infection (sepsis) is not likely. Local bacterial infection is possible. (NOTE)       Sepsis PCT Algorithm           Lower Respiratory Tract                                      Infection PCT Algorithm    ----------------------------     ----------------------------         PCT < 0.25 ng/mL                PCT < 0.10 ng/mL          Strongly encourage             Strongly discourage   discontinuation of antibiotics    initiation of antibiotics    ----------------------------     -----------------------------       PCT 0.25 - 0.50 ng/mL            PCT 0.10 - 0.25 ng/mL               OR       >80% decrease in PCT            Discourage initiation of  antibiotics      Encourage discontinuation           of antibiotics    ----------------------------      -----------------------------         PCT >= 0.50 ng/mL              PCT 0.26 - 0.50 ng/mL               AND        <80% decrease in PCT             Encourage initiation of                                             antibiotics       Encourage continuation           of antibiotics    ----------------------------     -----------------------------        PCT >= 0.50 ng/mL                  PCT > 0.50 ng/mL               AND         increase in PCT                  Strongly encourage                                      initiation of antibiotics    Strongly encourage escalation           of antibiotics                                     -----------------------------                                           PCT <= 0.25 ng/mL                                                 OR                                        > 80% decrease in PCT                                      Discontinue / Do not initiate                                             antibiotics  Performed at [facility], [address] [place], [place] [address]   I-Stat Lactic Acid, ED     Status: Abnormal  Collection Time: 10/31/23  1:37 AM  Result Value Ref Range   Lactic Acid, Venous >15.0 (HH) 0.5 - 1.9 mmol/L   Comment NOTIFIED PHYSICIAN   Lactic acid, plasma     Status: None   Collection Time: 10/31/23  1:54 AM  Result Value Ref Range   Lactic Acid, Venous 0.8 0.5 - 1.9 mmol/L    Comment: Performed at [facility], [address], [place], [place] [address]  Urinalysis, w/ Reflex to Culture (Infection Suspected) -Urine, Clean Catch     Status: Abnormal   Collection Time: 10/31/23  2:56 AM  Result Value Ref Range   Specimen Source URINE, CATHETERIZED    Color, Urine AMBER (A) YELLOW    Comment: BIOCHEMICALS MAY BE AFFECTED BY COLOR   APPearance CLEAR CLEAR   Specific Gravity, Urine 1.024 1.005 - 1.030   pH 5.0 5.0 - 8.0   Glucose, UA NEGATIVE NEGATIVE mg/dL   Hgb urine dipstick SMALL (A)  NEGATIVE   Bilirubin Urine SMALL (A) NEGATIVE   Ketones, ur NEGATIVE NEGATIVE mg/dL   Protein, ur NEGATIVE NEGATIVE mg/dL   Nitrite NEGATIVE NEGATIVE   Leukocytes,Ua NEGATIVE NEGATIVE   RBC / HPF 6-10 0 - 5 RBC/hpf   WBC, UA 6-10 0 - 5 WBC/hpf    Comment:        Reflex urine culture not performed if WBC <=10, OR if Squamous epithelial cells >5. If Squamous epithelial cells >5 suggest recollection.    Bacteria, UA RARE (A) NONE SEEN   Squamous Epithelial / HPF 0-5 0 - 5 /HPF   Mucus PRESENT     Comment: Performed at [facility], [address] [place], [place] [address]  Strep pneumoniae urinary antigen     Status: None   Collection Time: 10/31/23  2:56 AM  Result Value Ref Range   Strep Pneumo Urinary Antigen NEGATIVE NEGATIVE    Comment:        Infection due to S. pneumoniae cannot be absolutely ruled out since the antigen present may be below the detection limit of the test. Performed at [facility], [address], [place], [place] [address]   CBG monitoring, ED     Status: Abnormal   Collection Time: 10/31/23  3:41 AM  Result Value Ref Range   Glucose-Capillary 102 (H) 70 - 99 mg/dL    Comment: Glucose reference range applies only to samples taken after fasting for at least 8 hours.  Lactic acid, plasma     Status: None   Collection Time: 10/31/23  3:56 AM  Result Value Ref Range   Lactic Acid, Venous 1.1 0.5 - 1.9 mmol/L    Comment: Performed at [facility], [address], [place], [place] [address]  Vitamin B12     Status: None   Collection Time: 10/31/23  3:56 AM  Result Value Ref Range   Vitamin B-12 439 180 - 914 pg/mL    Comment: (NOTE) This assay is not validated for testing neonatal or myeloproliferative syndrome specimens for Vitamin B12 levels. Performed at [facility], [address] [place], [place] [address]   MRSA Next Gen by PCR, Nasal     Status: None   Collection Time: 10/31/23  3:56 AM   Specimen: Nasal Mucosa;  Nasal Swab  Result Value Ref Range   MRSA by PCR Next Gen NOT DETECTED NOT DETECTED    Comment: (NOTE) The GeneXpert MRSA Assay (FDA approved for NASAL specimens only),  is one component of a comprehensive MRSA colonization surveillance program. It is not intended to diagnose MRSA infection nor to guide or monitor treatment for MRSA infections. Test performance is not FDA approved in patients less than 73 years old. Performed at [facility], [address], [place], [place] [address]   Glucose, capillary     Status: Abnormal   Collection Time: 10/31/23  4:38 AM  Result Value Ref Range   Glucose-Capillary 114 (H) 70 - 99 mg/dL    Comment: Glucose reference range applies only to samples taken after fasting for at least 8 hours.  Glucose, capillary     Status: Abnormal   Collection Time: 10/31/23  7:36 AM  Result Value Ref Range   Glucose-Capillary 133 (H) 70 - 99 mg/dL    Comment: Glucose reference range applies only to samples taken after fasting for at least 8 hours.   CT CHEST ABDOMEN PELVIS W CONTRAST  Result Date: 10/31/2023 CLINICAL DATA:  62 year old male with history of sepsis. EXAM: CT CHEST, ABDOMEN, AND PELVIS WITH CONTRAST TECHNIQUE: Multidetector CT imaging of the chest, abdomen and pelvis was performed following the standard protocol during bolus administration of intravenous contrast. RADIATION DOSE REDUCTION: This exam was performed according to the departmental dose-optimization program which includes automated exposure control, adjustment of the mA and/or kV according to patient size and/or use of iterative reconstruction technique. CONTRAST:  ISOVUE-370 IOPAMIDOL (ISOVUE-370) INJECTION 76% COMPARISON:  CT of the chest, abdomen and pelvis 03/31/2023. FINDINGS: CT CHEST FINDINGS Cardiovascular: Heart size is mildly enlarged. There is no significant pericardial fluid, thickening or pericardial calcification. There is aortic atherosclerosis, as well as  atherosclerosis of the great vessels of the mediastinum and the coronary arteries, including calcified atherosclerotic plaque in the left anterior descending and right coronary arteries. Mediastinum/Nodes: No pathologically enlarged mediastinal or hilar lymph nodes. Mass-like enlargement and heterogeneous enhancement of the left lobe of the thyroid gland which measures at least 4.0 x 4.5 cm (axial image 5 of series 3), similar to the prior study. Esophagus is unremarkable in appearance. No axillary lymphadenopathy. Lungs/Pleura: Elevation of the right hemidiaphragm. Extensive linear architectural distortion throughout the lung bases bilaterally, increased compared to the prior study, likely combination of progressive chronic scarring and worsening areas of atelectasis. No confluent consolidative airspace disease. No pleural effusions. No definite suspicious appearing pulmonary nodules or masses confidently identified on today's motion limited examination. Musculoskeletal: There are no aggressive appearing lytic or blastic lesions noted in the visualized portions of the skeleton. CT ABDOMEN PELVIS FINDINGS Hepatobiliary: The liver is enlarged measuring 21.4 cm in craniocaudal span. Diffuse low attenuation throughout the hepatic parenchyma, indicative of a background of hepatic steatosis. No definite suspicious hepatic lesions. No intra or extrahepatic biliary ductal dilatation. Numerous partially calcified gallstones are noted within the lumen of the gallbladder. Gallbladder is completely decompressed around these indwelling stones. No definite pericholecystic fluid or focal surrounding inflammatory changes. Pancreas: No pancreatic mass. No pancreatic ductal dilatation. No pancreatic or peripancreatic fluid collections or inflammatory changes. Spleen: Unremarkable. Adrenals/Urinary Tract: Nonobstructive calculi in the right renal collecting system measuring up to 5 mm in the interpolar region. Left kidney and  bilateral adrenal glands are otherwise normal in appearance. No hydroureteronephrosis. Urinary bladder is unremarkable in appearance. Stomach/Bowel: The appearance of the stomach is normal. Colon is diffusely distended with a large volume of liquid stool. Multiple dilated loops of small bowel are also evident measuring up to 4 cm in diameter, with multiple air-fluid levels in  the small bowel. Several colonic diverticuli are noted, without definite focal surrounding inflammatory changes to clearly indicate an acute diverticulitis at this time. The appendix appears dilated, with a thickened mildly inflamed wall and multiple appendicoliths concerning for possible acute appendicitis. The appendix measures up to 14 mm in diameter, and is located posterior to the cecum in the right lower quadrant best appreciated on axial image 78 of series 3. Vascular/Lymphatic: Atherosclerotic calcifications in the abdominal aorta and pelvic vasculature. Numerous prominent borderline enlarged retroperitoneal and upper abdominal lymph nodes are noted, nonspecific, but presumably reactive. Reproductive: Prostate gland and seminal vesicles are unremarkable in appearance. Other: Left inguinal hernia containing only fat. No significant volume of ascites. No pneumoperitoneum. Musculoskeletal: There are no aggressive appearing lytic or blastic lesions noted in the visualized portions of the skeleton. IMPRESSION: 1. Findings are concerning for potential acute appendicitis given the dilated inflamed appearing appendix containing multiple appendicoliths. 2. Diffuse dilatation of the small bowel and colon, which may suggest an associated ileus, or could be indicative of enteritis/colitis. 3. No definite acute findings in the thorax. 4. Hepatomegaly with severe hepatic steatosis. 5. Aortic atherosclerosis, in addition to left anterior descending and right coronary artery disease. Please note that although the presence of coronary artery calcium  documents the presence of coronary artery disease, the severity of this disease and any potential stenosis cannot be assessed on this non-gated CT examination. Assessment for potential risk factor modification, dietary therapy or pharmacologic therapy may be warranted, if clinically indicated. 6. 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 Recommend thyroid US (ref: J Am Coll Radiol. 2015 Feb;12(2): 143-50). 7. Additional incidental findings, as above. Electronically Signed   By: Trudie Reed M.D.   On: 10/31/2023 07:21   DG Chest Portable 1 View  Result Date: 10/31/2023 CLINICAL DATA:  Possible sepsis. EXAM: PORTABLE CHEST 1 VIEW COMPARISON:  March 31, 2023 FINDINGS: The heart size and mediastinal contours are within normal limits. Low lung volumes are noted with mild elevation of the right hemidiaphragm. Mild atelectasis is suspected within the right lung base. No pleural effusion or pneumothorax is identified. Multilevel degenerative changes are seen throughout the thoracic spine. IMPRESSION: Low lung volumes with mild right basilar atelectasis. Electronically Signed   By: Aram Candela M.D.   On: 10/31/2023 04:34   CT Head Wo Contrast  Result Date: 10/31/2023 CLINICAL DATA:  Mental status change with unknown cause. Suspected sepsis. EXAM: CT HEAD WITHOUT CONTRAST TECHNIQUE: Contiguous axial images were obtained from the base of the skull through the vertex without intravenous contrast. RADIATION DOSE REDUCTION: This exam was performed according to the departmental dose-optimization program which includes automated exposure control, adjustment of the mA and/or kV according to patient size and/or use of iterative reconstruction technique. COMPARISON:  None Available. FINDINGS: Brain: No evidence of acute infarction, hemorrhage, hydrocephalus, extra-axial collection or mass lesion/mass effect. Vascular: No hyperdense vessel or unexpected  calcification. Skull: Normal. Negative for fracture or focal lesion. Sinuses/Orbits: Fluid level in the left sphenoid sinus where there is background wall sclerosis suggesting chronic sinusitis. IMPRESSION: 1. Normal appearance of the brain. 2. Small left sphenoid sinus fluid level. Electronically Signed   By: Tiburcio Pea M.D.   On: 10/31/2023 04:08      Assessment/Plan Encephalopathy, unclear etiology, possible appendicitis, appendicoliths The patient has been seen, examined, labs, vitals, chart, and imaging personally reviewed.  The patient does briefly wake up to loud voice.  When he does he  denies abdominal pain, specifically in the RLQ to palpation.  His WBC is normal and he is AF.  His CT scan has been reviewed.  He does have numerous appendicoliths within his appendix.  I do not see a significant amount of stranding or inflammation on his CT on my review.  He does have a diffuse adynamic ileus appearing pattern of unclear etiology.  He is currently on empiric abx therapy.  This could be continued to cover a possible appendicitis and to treat medically.  However, given no WBC, AF, and nontender on exam, it is reasonable to watch and follow clinical exam as able as my suspicion for true appendicitis is pretty low.  The patient also has a MELD score of 22 and is a Child Pugh C.  He has an 82% chance of mortality with any type of intra-abdominal operation.  Would continue to treat medically and conservatively at this point with no plans for surgical intervention.  D/W primary service on the ward.    FEN - NPO/IVFs VTE - Lovenox ID - zosyn  ETOH abuse ETOH pancreatitis, Hx of HTN Severe hepatic steatosis with significant liver dysfunction, MELD 22, Child Pugh C Morbid obesity  Ileus  I reviewed nursing notes, ED provider notes, hospitalist notes, last 24 h vitals and pain scores, last 48 h intake and output, last 24 h labs and trends, and last 24 h imaging results.  Letha Cape,  [facility] Surgery 10/31/2023, 11:17 AM Please see Amion for pager number during day hours 7:00am-4:30pm or 7:00am -11:30am on weekends

## 2023-10-31 NOTE — Progress Notes (Signed)
NAME:  Brian [name], MRN:  [id number], DOB:  1961-09-03, LOS: 0 ADMISSION DATE:  10/30/2023, CONSULTATION DATE:  10/31/2023 REFERRING MD:  Mesner, EDP CHIEF COMPLAINT:  encephalopathy    History of Present Illness:  62 y/o male with history of alcohol use disorder, cirrhosis, hypertension, and severe bilateral lower extremity lymphedema presented to the ED due to decreased responsiveness.  When he arrived to the ED they were unable to get in touch with his wife at home but the following day she answered her phone.  Wife states that over the past week or so he has been sleeping a lot more and staying up some nights.  Then on the evening of 11/11 she found him on the counter in his own urine and feces and was not responding to questions.  EMS arrived and the patient had borderline low oxygen saturations at 87 that improved to 92.  He became tachypneic and was started on oxygen.  He drinks about 2 fifths of 100 proof whiskey a week which is equivalent to 42 standard drinks. Last drink on 11/10 PM.   Pertinent  Medical History  History of pancreatitis, cholelithiasis, alcoholic ketoacidosis, thyroid nodule, HTN, fatty liver, lymphedema   Significant Hospital Events: Including procedures, antibiotic start and stop dates in addition to other pertinent events   11/12 admitted to ICU with severe encephalopathy   Interim History / Subjective:  Slowly waking up today but still significantly encephalopathic. No reported concerns. No abdominal pain.  Objective   Blood pressure (!) 157/91, pulse (!) 103, temperature 98.3 F (36.8 C), temperature source Oral, resp. rate 20, height 6' (1.829 m), weight 123.6 kg, SpO2 96%.        Intake/Output Summary (Last 24 hours) at 10/31/2023 0813 Last data filed at 10/31/2023 0500 Gross per 24 hour  Intake 1578.21 ml  Output --  Net 1578.21 ml   Filed Weights   10/31/23 0030 10/31/23 0500  Weight: 130 kg 123.6 kg    Examination: Constitutional:obese  and chronically ill appearing male. In no acute distress. HENT: Normocephalic, atraumatic,  Eyes: Scleral icterus, PERRL Cardio:Regular rate and rhythm. 2+ bilateral radial pulses. Pulm:Clear to auscultation bilaterally. Normal work of breathing on room air. Abdomen: distended and mildly tense, no tenderness positive bowel sounds. OZD:GUYQIH lymphedema bilateral LE Skin:Warm and dry. Neuro:lethargic but mildly arousable. No focal deficit noted.  Resolved Hospital Problem list     Assessment & Plan:  Hepatic Encephalopathy Workup not consistent with infection, CVA, or seizure. Elevated ammonia and history of HE not on lactulose at home. CTH and EEG without significant abnormality.  -NG tube placed -lactulose 30 mg TID -MRI brain to complete stroke workup  Alcohol Use Disorder w/ hx of withdrawal w/o seizures  -phenobarbital taper -thiamine, folate, MV  Colitis vs Ileitis w/ possible appendicitis  Seen on CT with dilated bowel, appendicoliths -discussed with surgery who recommend non operative management -NG tube decompression, clamp for 1 hour after lactulose   HTN Hold BP meds   Lymphedema -outpatient lymphedema clinic and Mx   Possible OSA -out patient NPSG   HypoK -replace K -CTM   Moderate PCM -RD in the am if not awake enough to remove NG tube  Best Practice (right click and "Reselect all SmartList Selections" daily)   Diet/type: NPO DVT prophylaxis: LMWH GI prophylaxis: N/A Lines: N/A Foley:  N/A Code Status:  full code Last date of multidisciplinary goals of care discussion [10/31/2023]  Labs   CBC: Recent Labs  Lab 10/30/23  2336 10/31/23 0009  WBC 6.9  --   NEUTROABS 5.4  --   HGB 15.3 16.0  HCT 45.0 47.0  MCV 100.7*  --   PLT 162  --     Basic Metabolic Panel: Recent Labs  Lab 10/30/23 2336 10/31/23 0009  NA 137 142  K 3.0* 2.9*  CL 101  --   CO2 20*  --   GLUCOSE 152*  --   BUN 25*  --   CREATININE 1.12  --   CALCIUM 9.5  --     GFR: Estimated Creatinine Clearance: 95.2 mL/min (by C-G formula based on SCr of 1.12 mg/dL). Recent Labs  Lab 10/30/23 2336 10/31/23 0009 10/31/23 0127 10/31/23 0137 10/31/23 0154 10/31/23 0356  PROCALCITON  --   --  <0.10  --   --   --   WBC 6.9  --   --   --   --   --   LATICACIDVEN  --  3.8*  --  >15.0* 0.8 1.1    Liver Function Tests: Recent Labs  Lab 10/30/23 2336  AST 44*  ALT 25  ALKPHOS 75  BILITOT 9.4*  PROT 8.7*  ALBUMIN 2.8*   Recent Labs  Lab 10/30/23 2336  LIPASE 35   Recent Labs  Lab 10/31/23 0127  AMMONIA 42*    ABG    Component Value Date/Time   HCO3 25.7 10/31/2023 0009   TCO2 27 10/31/2023 0009   O2SAT 98 10/31/2023 0009     Coagulation Profile: Recent Labs  Lab 10/30/23 2336  INR 1.4*    Cardiac Enzymes: No results for input(s): "CKTOTAL", "CKMB", "CKMBINDEX", "TROPONINI" in the last 168 hours.  HbA1C: No results found for: "HGBA1C"  CBG: Recent Labs  Lab 10/30/23 2355 10/31/23 0341 10/31/23 0438 10/31/23 0736  GLUCAP 150* 102* 114* 133*    Review of Systems:   Unable to obtain.  Past Medical History:  He,  has a past medical history of Alcoholic pancreatitis, ETOH abuse, HTN (hypertension), Hypertension, and Morbid obesity (HCC).   Surgical History:  History reviewed. No pertinent surgical history.   Social History:   reports that he has been smoking cigarettes. He does not have any smokeless tobacco history on file.   Family History:  His family history is not on file.   Allergies No Known Allergies   Home Medications  Prior to Admission medications   Medication Sig Start Date End Date Taking? Authorizing Provider  amLODipine (NORVASC) 10 MG tablet Take 1 tablet (10 mg total) by mouth daily. 04/06/23   Alwyn Ren, MD  Multiple Vitamins-Minerals (MULTIVITAMIN WITH MINERALS) tablet Take 1 tablet by mouth daily.    [provider]  Omega-3 Fatty Acids (FISH OIL) 1000 MG CAPS Take 2,000  mg by mouth daily.    [provider]  thiamine (VITAMIN B-1) 100 MG tablet Take 1 tablet (100 mg total) by mouth daily. 04/06/23   Alwyn Ren, MD     Critical care time: 52    Rocky Morel, DO Internal Medicine Resident, PGY-2 Pager# [phone] [facility] 10/31/2023 8:13 AM  For contact information, see Amion. If no response to pager, please call PCCM consult pager. After hours, 7PM- 7AM, please call Elink.

## 2023-10-31 NOTE — Progress Notes (Signed)
EEG complete - results pending 

## 2023-10-31 NOTE — H&P (Signed)
NAME:  Brian [name], MRN:  [id number], DOB:  [birth year], LOS: 0 ADMISSION DATE:  10/30/2023, CONSULTATION DATE:  10/31/2023 REFERRING MD:  ED MD, CHIEF COMPLAINT:  encephalopathy   History of Present Illness:  A 62 yr old male patient with obesity (BMI 38.8), fatty liver, lymphedema, and HTN, who was sent to ED due to decreased responsiveness since yesterday 8 pm. Reportedly he was last known to be well at 8 PM on Sunday evening. Wife found him sit on the couch in his own urine and feces not responding tonight and she called EMS. With EMS he was tachycardic and warm to touch, tachypneic would only say "yeah" to all questions. Did not flinch with IV or any kind of pain. Fire stated his room air saturation 87%, EMS it was 92 with them but because of the tachypnea started on oxygen. Ed attempted to call wife for collateral information no answer. No further history unable to be obtained at this time. Given Ativan 4 mg IV due to concerns about DT, 1 L LR, Cefepime, and Vanco.   Pertinent  Medical History  History of pancreatitis, cholelithiasis, alcoholic ketoacidosis, thyroid nodule, HTN, fatty liver, lymphedema   Significant Hospital Events: Including procedures, antibiotic start and stop dates in addition to other pertinent events     Interim History / Subjective:    Objective   Blood pressure 129/65, pulse (!) 116, temperature 99.8 F (37.7 C), temperature source Rectal, resp. rate 19, height 6' (1.829 m), weight 130 kg, SpO2 96%.       No intake or output data in the 24 hours ending 10/31/23 0330 Filed Weights   10/31/23 0030  Weight: 130 kg    Examination: General: lethargic, disoriented, and comfortable. On RA. SpO2 94%  HENT: PERL, normal pharynx and oral mucosa. No LNE or thyromegaly. No JVD Lungs: symmetrical air entry bilaterally. No crackles or wheezing Cardiovascular: NL S1/S2. No m/g/r Abdomen: no distension or tenderness Extremities: Lymphedema. Symmetrical. No signs  of infection Neuro: GCS: Eyes: 2, verbal response 2, motor response 5. No facial asymmetry     Resolved Hospital Problem list   Lactic acidosis   Assessment & Plan:  Metabolic encephalopathy. D.dx: HE, sepsis, PNA, UTI, abd/pelvic pathology -Vit B12, serial ammonia, TFT -IVF -Lactulose NGT -Vanco, Zosyn -Bcx2 -Sputum Cx -Urine Ags -UA/Ucx -CT chest/abd/pelvis -Duoneb -HOB elevation -I/S -Glycemic control -Consider neurology eval, EEG, and brain MRI  HTN Hold BP meds  Lymphedema -outpatient lymphedema clinic and Mx  Possible OSA -out patient NPSG  HypoK -replace K -am labs  Possible chronic alcoholism -MVT -Thiamine/folic acid -Watch for DT  Moderate PCM -Monitor oral intake -consider dietitian consult in am   Best Practice (right click and "Reselect all SmartList Selections" daily)   Diet/type: NPO w/ meds via tube DVT prophylaxis: LMWH GI prophylaxis: N/A Lines: N/A Foley:  N/A Code Status:  full code Last date of multidisciplinary goals of care discussion []   Labs   CBC: Recent Labs  Lab 10/30/23 2336 10/31/23 0009  WBC 6.9  --   NEUTROABS 5.4  --   HGB 15.3 16.0  HCT 45.0 47.0  MCV 100.7*  --   PLT 162  --     Basic Metabolic Panel: Recent Labs  Lab 10/30/23 2336 10/31/23 0009  NA 137 142  K 3.0* 2.9*  CL 101  --   CO2 20*  --   GLUCOSE 152*  --   BUN 25*  --   CREATININE 1.12  --  CALCIUM 9.5  --    GFR: Estimated Creatinine Clearance: 97.8 mL/min (by C-G formula based on SCr of 1.12 mg/dL). Recent Labs  Lab 10/30/23 2336 10/31/23 0009 10/31/23 0137 10/31/23 0154  WBC 6.9  --   --   --   LATICACIDVEN  --  3.8* >15.0* 0.8    Liver Function Tests: Recent Labs  Lab 10/30/23 2336  AST 44*  ALT 25  ALKPHOS 75  BILITOT 9.4*  PROT 8.7*  ALBUMIN 2.8*   Recent Labs  Lab 10/30/23 2336  LIPASE 35   Recent Labs  Lab 10/31/23 0127  AMMONIA 42*    ABG    Component Value Date/Time   HCO3 25.7 10/31/2023  0009   TCO2 27 10/31/2023 0009   O2SAT 98 10/31/2023 0009     Coagulation Profile: Recent Labs  Lab 10/30/23 2336  INR 1.4*    Cardiac Enzymes: No results for input(s): "CKTOTAL", "CKMB", "CKMBINDEX", "TROPONINI" in the last 168 hours.  HbA1C: No results found for: "HGBA1C"  CBG: Recent Labs  Lab 10/30/23 2355  GLUCAP 150*    Review of Systems:   AMS  Past Medical History:  He,  has a past medical history of Hypertension.   Surgical History:  No past surgical history on file.   Social History:      Family History:  His family history is not on file.   Allergies No Known Allergies   Home Medications  Prior to Admission medications   Medication Sig Start Date End Date Taking? Authorizing Provider  amLODipine (NORVASC) 10 MG tablet Take 1 tablet (10 mg total) by mouth daily. 04/06/23   Alwyn Ren, MD  Multiple Vitamins-Minerals (MULTIVITAMIN WITH MINERALS) tablet Take 1 tablet by mouth daily.    [provider]  Omega-3 Fatty Acids (FISH OIL) 1000 MG CAPS Take 2,000 mg by mouth daily.    [provider]  thiamine (VITAMIN B-1) 100 MG tablet Take 1 tablet (100 mg total) by mouth daily. 04/06/23   Alwyn Ren, MD     Critical care time: 77 min     Brendolyn Patty, MD, [facility] PCCM MD

## 2023-10-31 NOTE — Progress Notes (Signed)
eLink Physician-Brief Progress Note Patient Name: ERFAN MAHESHWARI DOB: 11-04-1961 MRN: [id number]   Date of Service  10/31/2023  HPI/Events of Note  62 yr old male patient with obesity (BMI 38.8), fatty liver, lymphedema, and HTN, who was sent to ED due to decreased responsiveness since yesterday 8 pm admitted to ICU for  Metabolic encephalopathy. From sepsis. UTI, Pneumonia. AHRF. ? DT 's.  - s/p fluids, abx. Follow cultures -asp precautions - on thiamine.   Electrolytes imbalance. On replacement.   Data reviewed Pco2 ok LA normalized. Pro calcitonin normal. Trop normal Amonia 42 Camera: Obese, getting kcl/mag replaced. Hypertensive , sats good. CTH no bleeding. CT chest/abdomen: results is pending. Images seen: Right lower atelectasis, air space densities. > left base.  UA rare bacteria. Wbc 6-10.   eICU Interventions  VTE lovenox.  On ssi, to keep CBG < 180.      Intervention Category Major Interventions: Sepsis - evaluation and management Evaluation Type: New Patient Evaluation  Ranee Gosselin 10/31/2023, 4:49 AM

## 2023-10-31 NOTE — ED Notes (Signed)
In & Out cath performed on pt

## 2023-10-31 NOTE — Progress Notes (Signed)
   10/31/23 1300  Spiritual Encounters  Type of Visit Initial  Care provided to: [facility] partners present during encounter Nurse  Referral source Family  Reason for visit Routine spiritual support  OnCall Visit No   Provided support to family waiting to see patient after he was brought to room.

## 2023-11-01 DIAGNOSIS — A419 Sepsis, unspecified organism: Secondary | ICD-10-CM

## 2023-11-01 DIAGNOSIS — R6520 Severe sepsis without septic shock: Secondary | ICD-10-CM

## 2023-11-01 LAB — BASIC METABOLIC PANEL
Anion gap: 11 (ref 5–15)
BUN: 15 mg/dL (ref 6–20)
CO2: 23 mmol/L (ref 22–32)
Calcium: 9.3 mg/dL (ref 8.9–10.3)
Chloride: 107 mmol/L (ref 98–111)
Creatinine, Ser: 0.76 mg/dL (ref 0.61–1.24)
GFR, Estimated: >60 mL/min (ref >60)
Glucose, Bld: 112 mg/dL — ABNORMAL HIGH (ref 70–99)
Potassium: 3 mmol/L — ABNORMAL LOW (ref 3.5–5.1)
Sodium: 141 mmol/L (ref 135–145)

## 2023-11-01 LAB — HEPATIC FUNCTION PANEL
ALT: 25 U/L (ref 0–44)
AST: 40 U/L (ref 15–41)
Albumin: 2.7 g/dL — ABNORMAL LOW (ref 3.5–5.0)
Alkaline Phosphatase: 65 U/L (ref 38–126)
Bilirubin, Direct: 4 mg/dL — ABNORMAL HIGH (ref 0.0–0.2)
Indirect Bilirubin: 4.8 mg/dL — ABNORMAL HIGH (ref 0.3–0.9)
Total Bilirubin: 8.8 mg/dL — ABNORMAL HIGH (ref <1.2)
Total Protein: 8 g/dL (ref 6.5–8.1)

## 2023-11-01 LAB — GLUCOSE, CAPILLARY
Glucose-Capillary: 93 mg/dL (ref 70–99)
Glucose-Capillary: 99 mg/dL (ref 70–99)
Glucose-Capillary: 96 mg/dL (ref 70–99)
Glucose-Capillary: 90 mg/dL (ref 70–99)
Glucose-Capillary: 114 mg/dL — ABNORMAL HIGH (ref 70–99)
Glucose-Capillary: 89 mg/dL (ref 70–99)

## 2023-11-01 LAB — CBC
HCT: 41.7 % (ref 39.0–52.0)
Hemoglobin: 14 g/dL (ref 13.0–17.0)
MCH: 33.7 pg (ref 26.0–34.0)
MCHC: 33.6 g/dL (ref 30.0–36.0)
MCV: 100.5 fL — ABNORMAL HIGH (ref 80.0–100.0)
Platelets: 87 10*3/uL — ABNORMAL LOW (ref 150–400)
RBC: 4.15 MIL/uL — ABNORMAL LOW (ref 4.22–5.81)
RDW: 15.2 % (ref 11.5–15.5)
WBC: 5.4 10*3/uL (ref 4.0–10.5)
nRBC: 0 % (ref 0.0–0.2)

## 2023-11-01 LAB — URINE CULTURE: Culture: NO GROWTH

## 2023-11-01 LAB — AMMONIA: Ammonia: 72 umol/L — ABNORMAL HIGH (ref 9–35)

## 2023-11-01 LAB — HEMOGLOBIN A1C
Hgb A1c MFr Bld: 5.8 % — ABNORMAL HIGH (ref 4.8–5.6)
Mean Plasma Glucose: 120 mg/dL

## 2023-11-01 LAB — LEGIONELLA PNEUMOPHILA SEROGP 1 UR AG: L. pneumophila Serogp 1 Ur Ag: NEGATIVE

## 2023-11-01 MED ORDER — ADULT MULTIVITAMIN W/MINERALS CH
1.0000 | ORAL_TABLET | Freq: Every day | ORAL | Status: DC
  Administered 2023-11-01 – 2023-11-04 (×4): 1
  Filled 2023-11-01 (×4): qty 1

## 2023-11-01 MED ORDER — THIAMINE MONONITRATE 100 MG PO TABS
100.0000 mg | ORAL_TABLET | Freq: Every day | ORAL | Status: DC
  Administered 2023-11-01 – 2023-11-04 (×3): 100 mg
  Filled 2023-11-01 (×4): qty 1

## 2023-11-01 MED ORDER — POLYETHYLENE GLYCOL 3350 17 G PO PACK
17.0000 g | PACK | Freq: Every day | ORAL | Status: DC | PRN
Start: 2023-11-01 — End: 2023-11-04

## 2023-11-01 MED ORDER — POTASSIUM CHLORIDE 10 MEQ/100ML IV SOLN
10.0000 meq | INTRAVENOUS | Status: AC
Start: 2023-11-01 — End: 2023-11-01
  Administered 2023-11-01 (×4): 10 meq via INTRAVENOUS
  Filled 2023-11-01 (×4): qty 100

## 2023-11-01 MED ORDER — INFLUENZA VIRUS VACC SPLIT PF (FLUZONE) 0.5 ML IM SUSY
0.5000 mL | PREFILLED_SYRINGE | INTRAMUSCULAR | Status: DC
  Filled 2023-11-01: qty 0.5

## 2023-11-01 MED ORDER — FOLIC ACID 1 MG PO TABS
1.0000 mg | ORAL_TABLET | Freq: Every day | ORAL | Status: DC
  Administered 2023-11-01 – 2023-11-04 (×4): 1 mg
  Filled 2023-11-01 (×4): qty 1

## 2023-11-01 MED ORDER — POTASSIUM CHLORIDE 20 MEQ PO PACK
20.0000 meq | PACK | ORAL | Status: AC
  Administered 2023-11-01 (×2): 20 meq
  Filled 2023-11-01 (×2): qty 1

## 2023-11-01 MED ORDER — POTASSIUM CHLORIDE 20 MEQ PO PACK
40.0000 meq | PACK | Freq: Once | ORAL | Status: AC
  Administered 2023-11-01: 40 meq
  Filled 2023-11-01: qty 2

## 2023-11-01 MED ORDER — TAMSULOSIN HCL 0.4 MG PO CAPS
0.4000 mg | ORAL_CAPSULE | Freq: Every day | ORAL | Status: DC
  Administered 2023-11-01 – 2023-11-08 (×8): 0.4 mg via ORAL
  Filled 2023-11-01 (×9): qty 1

## 2023-11-01 NOTE — Progress Notes (Signed)
PROGRESS NOTE    CAMERYN GILDEA  JXB:[id number] DOB: Oct 23, 1961 DOA: 10/30/2023 PCP: Patient, No Pcp Per   Brief Narrative:  8M with cirrhosis, alcohol dependence (drinks two 1/5ths of liquor weekly) and severe bilateral lower extremity lymphedema who was admitted for AMS after he was found down at home. Initial CT head is negative. Spot EEG shows diffuse encephalopathy. Ammonia level elevateed at 43. Alcohol level undetectable. CT Abdomen with dilated bowel loops with fluid filled stool and some thickening of the walls concerning for colitis. There is concern for wall thickening of the [place]. General Surgery consulted with low suspicion for appendicitis.    Assessment & Plan:   Principal Problem:   Sepsis (HCC) Active Problems:   Hepatic encephalopathy (HCC)   Acute encephalopathy, hepatic with possible metabolic component Concurrent lactic acidosis, poa -Elevated ammonia improving with lactulose via NG (30g TID) -Lactic acidosis likely related to above -no signs or symptoms of infection -CT head, EEG without overt abnormality -NG tube in place for medication administration, speech eval pending -MRI without overt acute or reversible findings    Alcohol Use Disorder w/ hx of withdrawal w/o seizures  -phenobarbital taper ongoing -Likely playing a role in patient's mental status as above -thiamine, folate, multivitamin per protocol -Continue to follow CIWA protocol   Colitis vs Ileitis -acute appendicitis ruled out -General Surgery consulted at intake, imaging reviewed not acute appendicitis given patient's lack of abdominal findings and clinical status. -Appendicolith noted on CT -NG tube initially placed for decompression, now for medication ministration until p.o. intake improves, then remove   HTN -Currently holding medications, resume once appropriate   Lymphedema -Reportedly chronic, follow up outpatient for further evaluation workup   Possible OSA -Recommend  outpatient sleep study   HypoK -Likely secondary to above poor p.o. intake and increased bowel movement with lactulose, replete as appropriate   Moderate to severe protein caloric malnutrition, POA -Speech eval pending, if unable to tolerate p.o. safely will utilize NG tube for nutrition  Prediabetes -Patient denies history of glucose intolerance, A1c 5.8 -Recommend improved diet and exercise  DVT prophylaxis: SCDs Start: 10/31/23 0326 Code Status:   Code Status: Full Code Family Communication: None present  Status is: Inpatient  Dispo: The patient is from: Home              Anticipated d/c is to: Home              Anticipated d/c date is: 24 to 48 hours              Patient currently not medically stable for discharge  Consultants:  PCCM  Procedures:  None  Antimicrobials:  None indicated  Subjective: No acute issues or events overnight denies headache fever chills vomiting diarrhea constipation chest pain or shortness of breath.  Notes tremors have improved but not yet resolved  Objective: Vitals:   11/01/23 0307 11/01/23 0331 11/01/23 0400 11/01/23 0500  BP: (!) 155/88  (!) 169/84 (!) 175/83  Pulse: (!) 114 (!) 111 (!) 106 (!) 114  Resp: (!) 21 (!) 22 (!) 22 (!) 21  Temp:  98.6 F (37 C)    TempSrc:  Oral    SpO2: 97% 97% 96%   Weight:    123.6 kg  Height:        Intake/Output Summary (Last 24 hours) at 11/01/2023 0646 Last data filed at 11/01/2023 0600 Gross per 24 hour  Intake 1419.13 ml  Output 1775 ml  Net -355.87 ml  Filed Weights   10/31/23 0030 10/31/23 0500 11/01/23 0500  Weight: 130 kg 123.6 kg 123.6 kg    Examination:  General:  Pleasantly resting in bed, No acute distress. HEENT:  Normocephalic atraumatic.  Sclerae nonicteric, noninjected.  Extraocular movements intact bilaterally. Neck:  Without mass or deformity.  Trachea is midline. Lungs:  Clear to auscultate bilaterally without rhonchi, wheeze, or rales. Heart:  Regular rate and  rhythm.  Without murmurs, rubs, or gallops. Abdomen:  Soft, nontender, nondistended.  Without guarding or rebound. Extremities: Without cyanosis, clubbing, edema, or obvious deformity.  Moderate tremor noted, worse with intention, improved at rest Skin:  Warm and dry, no erythema.  Data Reviewed: I have personally reviewed following labs and imaging studies  CBC: Recent Labs  Lab 10/30/23 2336 10/31/23 0009 11/01/23 0306  WBC 6.9  --  5.4  NEUTROABS 5.4  --   --   HGB 15.3 16.0 14.0  HCT 45.0 47.0 41.7  MCV 100.7*  --  100.5*  PLT 162  --  87*   Basic Metabolic Panel: Recent Labs  Lab 10/30/23 2336 10/31/23 0009 10/31/23 1425 11/01/23 0306  NA 137 142 140 141  K 3.0* 2.9* 3.3* 3.0*  CL 101  --  106 107  CO2 20*  --  21* 23  GLUCOSE 152*  --  120* 112*  BUN 25*  --  19 15  CREATININE 1.12  --  0.87 0.76  CALCIUM 9.5  --  9.2 9.3  MG  --   --  2.3  --    GFR: Estimated Creatinine Clearance: 133.3 mL/min (by C-G formula based on SCr of 0.76 mg/dL). Liver Function Tests: Recent Labs  Lab 10/30/23 2336 10/31/23 1425 11/01/23 0306  AST 44* 39 40  ALT 25 22 25   ALKPHOS 75 66 65  BILITOT 9.4* 9.3* 8.8*  PROT 8.7* 8.0 8.0  ALBUMIN 2.8* 2.6* 2.7*   Recent Labs  Lab 10/30/23 2336  LIPASE 35   Recent Labs  Lab 10/31/23 0127 11/01/23 0306  AMMONIA 42* 72*   Coagulation Profile: Recent Labs  Lab 10/30/23 2336  INR 1.4*   Cardiac Enzymes: No results for input(s): "CKTOTAL", "CKMB", "CKMBINDEX", "TROPONINI" in the last 168 hours. BNP (last 3 results) No results for input(s): "PROBNP" in the last 8760 hours. HbA1C: Recent Labs    10/30/23 2336  HGBA1C 5.8*   CBG: Recent Labs  Lab 10/31/23 1120 10/31/23 1703 10/31/23 1914 10/31/23 2310 11/01/23 0316  GLUCAP 124* 108* 115* 97 93   Lipid Profile: No results for input(s): "CHOL", "HDL", "LDLCALC", "TRIG", "CHOLHDL", "LDLDIRECT" in the last 72 hours. Thyroid Function Tests: Recent Labs     10/31/23 0127  TSH 0.162*  FREET4 0.96   Anemia Panel: Recent Labs    10/31/23 0356  VITAMINB12 439   Sepsis Labs: Recent Labs  Lab 10/31/23 0009 10/31/23 0127 10/31/23 0137 10/31/23 0154 10/31/23 0356  PROCALCITON  --  <0.10  --   --   --   LATICACIDVEN 3.8*  --  >15.0* 0.8 1.1    Recent Results (from the past 240 hour(s))  Culture, blood (Routine x 2)     Status: None (Preliminary result)   Collection Time: 10/30/23 11:34 PM   Specimen: BLOOD LEFT ARM  Result Value Ref Range Status   Specimen Description BLOOD LEFT ARM  Final   Special Requests   Final    BOTTLES DRAWN AEROBIC AND ANAEROBIC Blood Culture adequate volume   Culture   Final  NO GROWTH < 12 HOURS Performed at [facility], [address] [place], [place] [address]    Report Status PENDING  Incomplete  Culture, blood (Routine x 2)     Status: None (Preliminary result)   Collection Time: 10/30/23 11:34 PM   Specimen: BLOOD RIGHT ARM  Result Value Ref Range Status   Specimen Description BLOOD RIGHT ARM  Final   Special Requests   Final    BOTTLES DRAWN AEROBIC AND ANAEROBIC Blood Culture adequate volume   Culture   Final    NO GROWTH < 12 HOURS Performed at [facility], [address], [place], [place] [address]    Report Status PENDING  Incomplete  MRSA Next Gen by PCR, Nasal     Status: None   Collection Time: 10/31/23  3:56 AM   Specimen: Nasal Mucosa; Nasal Swab  Result Value Ref Range Status   MRSA by PCR Next Gen NOT DETECTED NOT DETECTED Final    Comment: (NOTE) The GeneXpert MRSA Assay (FDA approved for NASAL specimens only), is one component of a comprehensive MRSA colonization surveillance program. It is not intended to diagnose MRSA infection nor to guide or monitor treatment for MRSA infections. Test performance is not FDA approved in patients less than 37 years old. Performed at [facility], [address] [place], [place] [address]          Radiology  Studies: MR BRAIN WO CONTRAST  Result Date: 10/31/2023 CLINICAL DATA:  Mental status change of unknown cause. Difficulty holding still. EXAM: MRI HEAD WITHOUT CONTRAST TECHNIQUE: Multiplanar, multiecho pulse sequences of the brain and surrounding structures were obtained without intravenous contrast. COMPARISON:  Head CT earlier same day FINDINGS: Brain: The study suffers from motion degradation. No diffusion abnormality is identified, though motion degradation could obscure a subtle insult. Brain otherwise appears similarly normal without evidence of stroke, mass, hemorrhage, hydrocephalus or extra-axial collection. Because of the motion, a subtle lesion could be inapparent. Vascular: Major vessels at the base of the brain show flow. Skull and upper cervical spine: Negative Sinuses/Orbits: Sinuses are clear except for a small amount of fluid layering in the sphenoid sinus. Orbits negative. Other: None IMPRESSION: 1. Motion degraded study. No acute or reversible finding. Because of the motion, a subtle lesion could be inapparent. 2. Small amount of fluid layering in the sphenoid sinus. Electronically Signed   By: Paulina Fusi M.D.   On: 10/31/2023 17:19   DG Abd 1 View  Result Date: 10/31/2023 CLINICAL DATA:  NG tube placement EXAM: ABDOMEN - 1 VIEW COMPARISON:  CT 10/31/2023 FINDINGS: Esophageal tube tip overlies the gastric fundus. Air distended large and small bowel in the upper abdomen. IMPRESSION: Esophageal tube tip overlies the gastric fundus. Electronically Signed   By: Jasmine Pang M.D.   On: 10/31/2023 16:27   ECHOCARDIOGRAM COMPLETE  Result Date: 10/31/2023    ECHOCARDIOGRAM REPORT   Patient Name:   ALYSSA DEUTSCHMAN Date of Exam: 10/31/2023 Medical Rec #:  [id number]       Height:       72.0 in Accession #:    [id number]      Weight:       272.5 lb Date of Birth:  1961-10-12      BSA:          2.430 m Patient Age:    62 years        BP:  139/80 mmHg Patient Gender: M                HR:           94 bpm. Exam Location:  Inpatient Procedure: 2D Echo, Cardiac Doppler, Color Doppler and Intracardiac            Opacification Agent Indications:    Abnormal ECG R94.31  History:        Patient has no prior history of Echocardiogram examinations.                 Risk Factors:Hypertension.  Sonographer:    Darlys Gales Referring Phys: [id number] OMAR M ALBUSTAMI IMPRESSIONS  1. Left ventricular ejection fraction, by estimation, is 60 to 65%. The left ventricle has normal function. The left ventricle has no regional wall motion abnormalities. There is mild concentric left ventricular hypertrophy. Left ventricular diastolic function could not be evaluated.  2. Right ventricular systolic function is normal. The right ventricular size is normal.  3. The mitral valve is normal in structure. No evidence of mitral valve regurgitation. No evidence of mitral stenosis.  4. The aortic valve is tricuspid. Aortic valve regurgitation is not visualized. Aortic valve sclerosis/calcification is present, without any evidence of aortic stenosis. Aortic valve area, by VTI measures 3.42 cm. Aortic valve mean gradient measures 4.0 mmHg. Aortic valve Vmax measures 1.31 m/s. FINDINGS  Left Ventricle: Left ventricular ejection fraction, by estimation, is 60 to 65%. The left ventricle has normal function. The left ventricle has no regional wall motion abnormalities. The left ventricular internal cavity size was normal in size. There is  mild concentric left ventricular hypertrophy. Left ventricular diastolic function could not be evaluated. Right Ventricle: The right ventricular size is normal. No increase in right ventricular wall thickness. Right ventricular systolic function is normal. Left Atrium: Left atrial size was normal in size. Right Atrium: Right atrial size was normal in size. Pericardium: There is no evidence of pericardial effusion. Mitral Valve: The mitral valve is normal in structure. Mild mitral annular  calcification. No evidence of mitral valve regurgitation. No evidence of mitral valve stenosis. Tricuspid Valve: The tricuspid valve is normal in structure. Tricuspid valve regurgitation is not demonstrated. No evidence of tricuspid stenosis. Aortic Valve: The aortic valve is tricuspid. Aortic valve regurgitation is not visualized. Aortic valve sclerosis/calcification is present, without any evidence of aortic stenosis. Aortic valve mean gradient measures 4.0 mmHg. Aortic valve peak gradient measures 6.9 mmHg. Aortic valve area, by VTI measures 3.42 cm. Pulmonic Valve: The pulmonic valve was normal in structure. Pulmonic valve regurgitation is not visualized. No evidence of pulmonic stenosis. Aorta: The aortic root is normal in size and structure. Venous: The inferior vena cava was not well visualized. IAS/Shunts: No atrial level shunt detected by color flow Doppler.  LEFT VENTRICLE PLAX 2D LVIDd:         5.00 cm   Diastology LVIDs:         3.50 cm   LV e' medial:   7.18 cm/s LV PW:         1.40 cm   LV E/e' medial: 11.7 LV IVS:        1.30 cm LVOT diam:     2.25 cm LV SV:         71 LV SV Index:   29 LVOT Area:     3.98 cm  RIGHT VENTRICLE RV S prime:     15.40 cm/s TAPSE (M-mode): 1.8 cm LEFT ATRIUM  Index LA Vol (A2C): 35.4 ml 14.57 ml/m LA Vol (A4C): 33.9 ml 13.95 ml/m  AORTIC VALVE AV Area (Vmax):    3.19 cm AV Area (Vmean):   3.21 cm AV Area (VTI):     3.42 cm AV Vmax:           131.00 cm/s AV Vmean:          85.900 cm/s AV VTI:            0.207 m AV Peak Grad:      6.9 mmHg AV Mean Grad:      4.0 mmHg LVOT Vmax:         105.00 cm/s LVOT Vmean:        69.400 cm/s LVOT VTI:          0.178 m LVOT/AV VTI ratio: 0.86  AORTA Ao Root diam: 3.50 cm MITRAL VALVE MV Area (PHT): 3.45 cm    SHUNTS MV Decel Time: 220 msec    Systemic VTI:  0.18 m MV E velocity: 83.70 cm/s  Systemic Diam: 2.25 cm MV A velocity: 96.70 cm/s MV E/A ratio:  0.87 Armanda Magic MD Electronically signed by Armanda Magic MD  Signature Date/Time: 10/31/2023/1:56:40 PM    Final    EEG adult  Result Date: 10/31/2023 Charlsie Quest, MD     10/31/2023 12:10 PM Patient Name: JULY MELCHI MRN: [id number] Epilepsy Attending: Charlsie Quest Referring Physician/Provider: Rocky Morel, DO Date: 10/31/2023 Duration: 23.56 mins Patient history: 62yo M with ams getting eeg to evaluate for seizure Level of alertness: Awake/ lethargic AEDs during EEG study: Ativan Technical aspects: This EEG study was done with scalp electrodes positioned according to the 10-20 International system of electrode placement. Electrical activity was reviewed with band pass filter of 1-70Hz , sensitivity of 7 uV/mm, display speed of 80mm/sec with a 60Hz  notched filter applied as appropriate. EEG data were recorded continuously and digitally stored.  Video monitoring was available and reviewed as appropriate. Description: No posterior dominant rhythm was noted. EEG showed continuous generalized 3 to 5 Hz theta-delta slowing, at times with triphasic morphology. Hyperventilation and photic stimulation were not performed.   ABNORMALITY - Continuous slow, generalized IMPRESSION: This study is suggestive of moderate to severe diffuse encephalopathy. No seizures or epileptiform discharges were seen throughout the recording. Charlsie Quest   CT CHEST ABDOMEN PELVIS W CONTRAST  Result Date: 10/31/2023 CLINICAL DATA:  62 year old male with history of sepsis. EXAM: CT CHEST, ABDOMEN, AND PELVIS WITH CONTRAST TECHNIQUE: Multidetector CT imaging of the chest, abdomen and pelvis was performed following the standard protocol during bolus administration of intravenous contrast. RADIATION DOSE REDUCTION: This exam was performed according to the departmental dose-optimization program which includes automated exposure control, adjustment of the mA and/or kV according to patient size and/or use of iterative reconstruction technique. CONTRAST:  ISOVUE-370 IOPAMIDOL  (ISOVUE-370) INJECTION 76% COMPARISON:  CT of the chest, abdomen and pelvis 03/31/2023. FINDINGS: CT CHEST FINDINGS Cardiovascular: Heart size is mildly enlarged. There is no significant pericardial fluid, thickening or pericardial calcification. There is aortic atherosclerosis, as well as atherosclerosis of the great vessels of the mediastinum and the coronary arteries, including calcified atherosclerotic plaque in the left anterior descending and right coronary arteries. Mediastinum/Nodes: No pathologically enlarged mediastinal or hilar lymph nodes. Mass-like enlargement and heterogeneous enhancement of the left lobe of the thyroid gland which measures at least 4.0 x 4.5 cm (axial image 5 of series 3), similar to the prior study. Esophagus is unremarkable  in appearance. No axillary lymphadenopathy. Lungs/Pleura: Elevation of the right hemidiaphragm. Extensive linear architectural distortion throughout the lung bases bilaterally, increased compared to the prior study, likely combination of progressive chronic scarring and worsening areas of atelectasis. No confluent consolidative airspace disease. No pleural effusions. No definite suspicious appearing pulmonary nodules or masses confidently identified on today's motion limited examination. Musculoskeletal: There are no aggressive appearing lytic or blastic lesions noted in the visualized portions of the skeleton. CT ABDOMEN PELVIS FINDINGS Hepatobiliary: The liver is enlarged measuring 21.4 cm in craniocaudal span. Diffuse low attenuation throughout the hepatic parenchyma, indicative of a background of hepatic steatosis. No definite suspicious hepatic lesions. No intra or extrahepatic biliary ductal dilatation. Numerous partially calcified gallstones are noted within the lumen of the gallbladder. Gallbladder is completely decompressed around these indwelling stones. No definite pericholecystic fluid or focal surrounding inflammatory changes. Pancreas: No pancreatic  mass. No pancreatic ductal dilatation. No pancreatic or peripancreatic fluid collections or inflammatory changes. Spleen: Unremarkable. Adrenals/Urinary Tract: Nonobstructive calculi in the right renal collecting system measuring up to 5 mm in the interpolar region. Left kidney and bilateral adrenal glands are otherwise normal in appearance. No hydroureteronephrosis. Urinary bladder is unremarkable in appearance. Stomach/Bowel: The appearance of the stomach is normal. Colon is diffusely distended with a large volume of liquid stool. Multiple dilated loops of small bowel are also evident measuring up to 4 cm in diameter, with multiple air-fluid levels in the small bowel. Several colonic diverticuli are noted, without definite focal surrounding inflammatory changes to clearly indicate an acute diverticulitis at this time. The appendix appears dilated, with a thickened mildly inflamed wall and multiple appendicoliths concerning for possible acute appendicitis. The appendix measures up to 14 mm in diameter, and is located posterior to the cecum in the right lower quadrant best appreciated on axial image 78 of series 3. Vascular/Lymphatic: Atherosclerotic calcifications in the abdominal aorta and pelvic vasculature. Numerous prominent borderline enlarged retroperitoneal and upper abdominal lymph nodes are noted, nonspecific, but presumably reactive. Reproductive: Prostate gland and seminal vesicles are unremarkable in appearance. Other: Left inguinal hernia containing only fat. No significant volume of ascites. No pneumoperitoneum. Musculoskeletal: There are no aggressive appearing lytic or blastic lesions noted in the visualized portions of the skeleton. IMPRESSION: 1. Findings are concerning for potential acute appendicitis given the dilated inflamed appearing appendix containing multiple appendicoliths. 2. Diffuse dilatation of the small bowel and colon, which may suggest an associated ileus, or could be indicative of  enteritis/colitis. 3. No definite acute findings in the thorax. 4. Hepatomegaly with severe hepatic steatosis. 5. Aortic atherosclerosis, in addition to left anterior descending and right coronary artery disease. Please note that although the presence of coronary artery calcium documents the presence of coronary artery disease, the severity of this disease and any potential stenosis cannot be assessed on this non-gated CT examination. Assessment for potential risk factor modification, dietary therapy or pharmacologic therapy may be warranted, if clinically indicated. 6. 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 Recommend thyroid US (ref: J Am Coll Radiol. 2015 Feb;12(2): 143-50). 7. Additional incidental findings, as above. Electronically Signed   By: Trudie Reed M.D.   On: 10/31/2023 07:21   DG Chest Portable 1 View  Result Date: 10/31/2023 CLINICAL DATA:  Possible sepsis. EXAM: PORTABLE CHEST 1 VIEW COMPARISON:  March 31, 2023 FINDINGS: The heart size and mediastinal contours are within normal limits. Low lung volumes are noted with mild elevation of the right hemidiaphragm.  Mild atelectasis is suspected within the right lung base. No pleural effusion or pneumothorax is identified. Multilevel degenerative changes are seen throughout the thoracic spine. IMPRESSION: Low lung volumes with mild right basilar atelectasis. Electronically Signed   By: Aram Candela M.D.   On: 10/31/2023 04:34   CT Head Wo Contrast  Result Date: 10/31/2023 CLINICAL DATA:  Mental status change with unknown cause. Suspected sepsis. EXAM: CT HEAD WITHOUT CONTRAST TECHNIQUE: Contiguous axial images were obtained from the base of the skull through the vertex without intravenous contrast. RADIATION DOSE REDUCTION: This exam was performed according to the departmental dose-optimization program which includes automated exposure control, adjustment of the mA and/or kV  according to patient size and/or use of iterative reconstruction technique. COMPARISON:  None Available. FINDINGS: Brain: No evidence of acute infarction, hemorrhage, hydrocephalus, extra-axial collection or mass lesion/mass effect. Vascular: No hyperdense vessel or unexpected calcification. Skull: Normal. Negative for fracture or focal lesion. Sinuses/Orbits: Fluid level in the left sphenoid sinus where there is background wall sclerosis suggesting chronic sinusitis. IMPRESSION: 1. Normal appearance of the brain. 2. Small left sphenoid sinus fluid level. Electronically Signed   By: Tiburcio Pea M.D.   On: 10/31/2023 04:08    Scheduled Meds:  Chlorhexidine Gluconate Cloth  6 each Topical Daily   enoxaparin (LOVENOX) injection  60 mg Subcutaneous Q24H   folic acid  1 mg Oral Daily   insulin aspart  0-9 Units Subcutaneous Q4H   lactulose  10 g Per Tube BID   multivitamin with minerals  1 tablet Oral Daily   phenobarbital  64.8 mg Per Tube Q8H   Followed by   [START ON 11/02/2023] phenobarbital  32.4 mg Per Tube Q8H   potassium chloride  20 mEq Per Tube Q4H   thiamine  100 mg Intravenous Daily   Continuous Infusions:  piperacillin-tazobactam (ZOSYN)  IV 12.5 mL/hr at 11/01/23 0600   potassium chloride 10 mEq (11/01/23 0630)     LOS: 1 day   Time spent:  Azucena Fallen, DO [facility]  If 7PM-7AM, please contact night-coverage [email or web address]  11/01/2023, 6:46 AM

## 2023-11-01 NOTE — Progress Notes (Signed)
Subjective: Patient much more awake and alert today.  Denies any abdominal pain.  Did not have any abdominal pain over the weekend leading up to his hospitalization.  Primary service spoke with wife yesterday and patient drinks about 2 1/5 of liquor a week and suspects this is hepatic encephalopathy in origin  ROS: See above, otherwise other systems negative  Objective: Vital signs in last 24 hours: Temp:  [98.2 F (36.8 C)-99 F (37.2 C)] 99 F (37.2 C) (11/13 0729) Pulse Rate:  [84-122] 111 (11/13 0700) Resp:  [14-27] 15 (11/13 0700) BP: (113-175)/(63-108) 169/88 (11/13 0700) SpO2:  [93 %-98 %] 95 % (11/13 0700) Weight:  [123.6 kg] 123.6 kg (11/13 0500) Last BM Date : 10/31/23  Intake/Output from previous day: 11/12 0701 - 11/13 0700 In: 1339.3 [I.V.:920.9; NG/GT:230; IV Piggyback:188.4] Out: 1775 [Urine:700; Stool:1075] Intake/Output this shift: No intake/output data recorded.  PE: Gen: NAD, awake and alert Abd: soft, NT, some distention, specifically no pain in RLQ at all.  Lab Results:  Recent Labs    10/30/23 2336 10/31/23 0009 11/01/23 0306  WBC 6.9  --  5.4  HGB 15.3 16.0 14.0  HCT 45.0 47.0 41.7  PLT 162  --  87*   BMET Recent Labs    10/31/23 1425 11/01/23 0306  NA 140 141  K 3.3* 3.0*  CL 106 107  CO2 21* 23  GLUCOSE 120* 112*  BUN 19 15  CREATININE 0.87 0.76  CALCIUM 9.2 9.3   PT/INR Recent Labs    10/30/23 2336  LABPROT 17.7*  INR 1.4*   CMP     Component Value Date/Time   NA 141 11/01/2023 0306   K 3.0 (L) 11/01/2023 0306   CL 107 11/01/2023 0306   CO2 23 11/01/2023 0306   GLUCOSE 112 (H) 11/01/2023 0306   BUN 15 11/01/2023 0306   CREATININE 0.76 11/01/2023 0306   CALCIUM 9.3 11/01/2023 0306   PROT 8.0 11/01/2023 0306   ALBUMIN 2.7 (L) 11/01/2023 0306   AST 40 11/01/2023 0306   ALT 25 11/01/2023 0306   ALKPHOS 65 11/01/2023 0306   BILITOT 8.8 (H) 11/01/2023 0306   GFRNONAA >60 11/01/2023 0306   GFRAA >60 01/30/2018  1333   Lipase     Component Value Date/Time   LIPASE 35 10/30/2023 2336       Studies/Results: MR BRAIN WO CONTRAST  Result Date: 10/31/2023 CLINICAL DATA:  Mental status change of unknown cause. Difficulty holding still. EXAM: MRI HEAD WITHOUT CONTRAST TECHNIQUE: Multiplanar, multiecho pulse sequences of the brain and surrounding structures were obtained without intravenous contrast. COMPARISON:  Head CT earlier same day FINDINGS: Brain: The study suffers from motion degradation. No diffusion abnormality is identified, though motion degradation could obscure a subtle insult. Brain otherwise appears similarly normal without evidence of stroke, mass, hemorrhage, hydrocephalus or extra-axial collection. Because of the motion, a subtle lesion could be inapparent. Vascular: Major vessels at the base of the brain show flow. Skull and upper cervical spine: Negative Sinuses/Orbits: Sinuses are clear except for a small amount of fluid layering in the sphenoid sinus. Orbits negative. Other: None IMPRESSION: 1. Motion degraded study. No acute or reversible finding. Because of the motion, a subtle lesion could be inapparent. 2. Small amount of fluid layering in the sphenoid sinus. Electronically Signed   By: Paulina Fusi M.D.   On: 10/31/2023 17:19   DG Abd 1 View  Result Date: 10/31/2023 CLINICAL DATA:  NG tube placement EXAM:  ABDOMEN - 1 VIEW COMPARISON:  CT 10/31/2023 FINDINGS: Esophageal tube tip overlies the gastric fundus. Air distended large and small bowel in the upper abdomen. IMPRESSION: Esophageal tube tip overlies the gastric fundus. Electronically Signed   By: Jasmine Pang M.D.   On: 10/31/2023 16:27   ECHOCARDIOGRAM COMPLETE  Result Date: 10/31/2023    ECHOCARDIOGRAM REPORT   Patient Name:   Brian [name] Date of Exam: 10/31/2023 Medical Rec #:  [id number]       Height:       72.0 in Accession #:    [id number]      Weight:       272.5 lb Date of Birth:  05-Nov-1961      BSA:           2.430 m Patient Age:    62 years        BP:           139/80 mmHg Patient Gender: M               HR:           94 bpm. Exam Location:  Inpatient Procedure: 2D Echo, Cardiac Doppler, Color Doppler and Intracardiac            Opacification Agent Indications:    Abnormal ECG R94.31  History:        Patient has no prior history of Echocardiogram examinations.                 Risk Factors:Hypertension.  Sonographer:    Darlys Gales Referring Phys: [id number] OMAR M ALBUSTAMI IMPRESSIONS  1. Left ventricular ejection fraction, by estimation, is 60 to 65%. The left ventricle has normal function. The left ventricle has no regional wall motion abnormalities. There is mild concentric left ventricular hypertrophy. Left ventricular diastolic function could not be evaluated.  2. Right ventricular systolic function is normal. The right ventricular size is normal.  3. The mitral valve is normal in structure. No evidence of mitral valve regurgitation. No evidence of mitral stenosis.  4. The aortic valve is tricuspid. Aortic valve regurgitation is not visualized. Aortic valve sclerosis/calcification is present, without any evidence of aortic stenosis. Aortic valve area, by VTI measures 3.42 cm. Aortic valve mean gradient measures 4.0 mmHg. Aortic valve Vmax measures 1.31 m/s. FINDINGS  Left Ventricle: Left ventricular ejection fraction, by estimation, is 60 to 65%. The left ventricle has normal function. The left ventricle has no regional wall motion abnormalities. The left ventricular internal cavity size was normal in size. There is  mild concentric left ventricular hypertrophy. Left ventricular diastolic function could not be evaluated. Right Ventricle: The right ventricular size is normal. No increase in right ventricular wall thickness. Right ventricular systolic function is normal. Left Atrium: Left atrial size was normal in size. Right Atrium: Right atrial size was normal in size. Pericardium: There is no evidence of  pericardial effusion. Mitral Valve: The mitral valve is normal in structure. Mild mitral annular calcification. No evidence of mitral valve regurgitation. No evidence of mitral valve stenosis. Tricuspid Valve: The tricuspid valve is normal in structure. Tricuspid valve regurgitation is not demonstrated. No evidence of tricuspid stenosis. Aortic Valve: The aortic valve is tricuspid. Aortic valve regurgitation is not visualized. Aortic valve sclerosis/calcification is present, without any evidence of aortic stenosis. Aortic valve mean gradient measures 4.0 mmHg. Aortic valve peak gradient measures 6.9 mmHg. Aortic valve area, by VTI measures 3.42 cm. Pulmonic Valve: The pulmonic valve was normal in structure.  Pulmonic valve regurgitation is not visualized. No evidence of pulmonic stenosis. Aorta: The aortic root is normal in size and structure. Venous: The inferior vena cava was not well visualized. IAS/Shunts: No atrial level shunt detected by color flow Doppler.  LEFT VENTRICLE PLAX 2D LVIDd:         5.00 cm   Diastology LVIDs:         3.50 cm   LV e' medial:   7.18 cm/s LV PW:         1.40 cm   LV E/e' medial: 11.7 LV IVS:        1.30 cm LVOT diam:     2.25 cm LV SV:         71 LV SV Index:   29 LVOT Area:     3.98 cm  RIGHT VENTRICLE RV S prime:     15.40 cm/s TAPSE (M-mode): 1.8 cm LEFT ATRIUM           Index LA Vol (A2C): 35.4 ml 14.57 ml/m LA Vol (A4C): 33.9 ml 13.95 ml/m  AORTIC VALVE AV Area (Vmax):    3.19 cm AV Area (Vmean):   3.21 cm AV Area (VTI):     3.42 cm AV Vmax:           131.00 cm/s AV Vmean:          85.900 cm/s AV VTI:            0.207 m AV Peak Grad:      6.9 mmHg AV Mean Grad:      4.0 mmHg LVOT Vmax:         105.00 cm/s LVOT Vmean:        69.400 cm/s LVOT VTI:          0.178 m LVOT/AV VTI ratio: 0.86  AORTA Ao Root diam: 3.50 cm MITRAL VALVE MV Area (PHT): 3.45 cm    SHUNTS MV Decel Time: 220 msec    Systemic VTI:  0.18 m MV E velocity: 83.70 cm/s  Systemic Diam: 2.25 cm MV A velocity:  96.70 cm/s MV E/A ratio:  0.87 Armanda Magic MD Electronically signed by Armanda Magic MD Signature Date/Time: 10/31/2023/1:56:40 PM    Final    EEG adult  Result Date: 10/31/2023 Charlsie Quest, MD     10/31/2023 12:10 PM Patient Name: Brian [name] MRN: [id number] Epilepsy Attending: Charlsie Quest Referring Physician/Provider: Rocky Morel, DO Date: 10/31/2023 Duration: 23.56 mins Patient history: 62yo M with ams getting eeg to evaluate for seizure Level of alertness: Awake/ lethargic AEDs during EEG study: Ativan Technical aspects: This EEG study was done with scalp electrodes positioned according to the 10-20 International system of electrode placement. Electrical activity was reviewed with band pass filter of 1-70Hz , sensitivity of 7 uV/mm, display speed of 74mm/sec with a 60Hz  notched filter applied as appropriate. EEG data were recorded continuously and digitally stored.  Video monitoring was available and reviewed as appropriate. Description: No posterior dominant rhythm was noted. EEG showed continuous generalized 3 to 5 Hz theta-delta slowing, at times with triphasic morphology. Hyperventilation and photic stimulation were not performed.   ABNORMALITY - Continuous slow, generalized IMPRESSION: This study is suggestive of moderate to severe diffuse encephalopathy. No seizures or epileptiform discharges were seen throughout the recording. Charlsie Quest   CT CHEST ABDOMEN PELVIS W CONTRAST  Result Date: 10/31/2023 CLINICAL DATA:  62 year old male with history of sepsis. EXAM: CT CHEST, ABDOMEN, AND PELVIS WITH CONTRAST TECHNIQUE: Multidetector  CT imaging of the chest, abdomen and pelvis was performed following the standard protocol during bolus administration of intravenous contrast. RADIATION DOSE REDUCTION: This exam was performed according to the departmental dose-optimization program which includes automated exposure control, adjustment of the mA and/or kV according to patient size  and/or use of iterative reconstruction technique. CONTRAST:  ISOVUE-370 IOPAMIDOL (ISOVUE-370) INJECTION 76% COMPARISON:  CT of the chest, abdomen and pelvis 03/31/2023. FINDINGS: CT CHEST FINDINGS Cardiovascular: Heart size is mildly enlarged. There is no significant pericardial fluid, thickening or pericardial calcification. There is aortic atherosclerosis, as well as atherosclerosis of the great vessels of the mediastinum and the coronary arteries, including calcified atherosclerotic plaque in the left anterior descending and right coronary arteries. Mediastinum/Nodes: No pathologically enlarged mediastinal or hilar lymph nodes. Mass-like enlargement and heterogeneous enhancement of the left lobe of the thyroid gland which measures at least 4.0 x 4.5 cm (axial image 5 of series 3), similar to the prior study. Esophagus is unremarkable in appearance. No axillary lymphadenopathy. Lungs/Pleura: Elevation of the right hemidiaphragm. Extensive linear architectural distortion throughout the lung bases bilaterally, increased compared to the prior study, likely combination of progressive chronic scarring and worsening areas of atelectasis. No confluent consolidative airspace disease. No pleural effusions. No definite suspicious appearing pulmonary nodules or masses confidently identified on today's motion limited examination. Musculoskeletal: There are no aggressive appearing lytic or blastic lesions noted in the visualized portions of the skeleton. CT ABDOMEN PELVIS FINDINGS Hepatobiliary: The liver is enlarged measuring 21.4 cm in craniocaudal span. Diffuse low attenuation throughout the hepatic parenchyma, indicative of a background of hepatic steatosis. No definite suspicious hepatic lesions. No intra or extrahepatic biliary ductal dilatation. Numerous partially calcified gallstones are noted within the lumen of the gallbladder. Gallbladder is completely decompressed around these indwelling stones. No definite  pericholecystic fluid or focal surrounding inflammatory changes. Pancreas: No pancreatic mass. No pancreatic ductal dilatation. No pancreatic or peripancreatic fluid collections or inflammatory changes. Spleen: Unremarkable. Adrenals/Urinary Tract: Nonobstructive calculi in the right renal collecting system measuring up to 5 mm in the interpolar region. Left kidney and bilateral adrenal glands are otherwise normal in appearance. No hydroureteronephrosis. Urinary bladder is unremarkable in appearance. Stomach/Bowel: The appearance of the stomach is normal. Colon is diffusely distended with a large volume of liquid stool. Multiple dilated loops of small bowel are also evident measuring up to 4 cm in diameter, with multiple air-fluid levels in the small bowel. Several colonic diverticuli are noted, without definite focal surrounding inflammatory changes to clearly indicate an acute diverticulitis at this time. The appendix appears dilated, with a thickened mildly inflamed wall and multiple appendicoliths concerning for possible acute appendicitis. The appendix measures up to 14 mm in diameter, and is located posterior to the cecum in the right lower quadrant best appreciated on axial image 78 of series 3. Vascular/Lymphatic: Atherosclerotic calcifications in the abdominal aorta and pelvic vasculature. Numerous prominent borderline enlarged retroperitoneal and upper abdominal lymph nodes are noted, nonspecific, but presumably reactive. Reproductive: Prostate gland and seminal vesicles are unremarkable in appearance. Other: Left inguinal hernia containing only fat. No significant volume of ascites. No pneumoperitoneum. Musculoskeletal: There are no aggressive appearing lytic or blastic lesions noted in the visualized portions of the skeleton. IMPRESSION: 1. Findings are concerning for potential acute appendicitis given the dilated inflamed appearing appendix containing multiple appendicoliths. 2. Diffuse dilatation of  the small bowel and colon, which may suggest an associated ileus, or could be indicative of enteritis/colitis. 3. No definite acute findings in  the thorax. 4. Hepatomegaly with severe hepatic steatosis. 5. Aortic atherosclerosis, in addition to left anterior descending and right coronary artery disease. Please note that although the presence of coronary artery calcium documents the presence of coronary artery disease, the severity of this disease and any potential stenosis cannot be assessed on this non-gated CT examination. Assessment for potential risk factor modification, dietary therapy or pharmacologic therapy may be warranted, if clinically indicated. 6. 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 Recommend thyroid US (ref: J Am Coll Radiol. 2015 Feb;12(2): 143-50). 7. Additional incidental findings, as above. Electronically Signed   By: Trudie Reed M.D.   On: 10/31/2023 07:21   DG Chest Portable 1 View  Result Date: 10/31/2023 CLINICAL DATA:  Possible sepsis. EXAM: PORTABLE CHEST 1 VIEW COMPARISON:  March 31, 2023 FINDINGS: The heart size and mediastinal contours are within normal limits. Low lung volumes are noted with mild elevation of the right hemidiaphragm. Mild atelectasis is suspected within the right lung base. No pleural effusion or pneumothorax is identified. Multilevel degenerative changes are seen throughout the thoracic spine. IMPRESSION: Low lung volumes with mild right basilar atelectasis. Electronically Signed   By: Aram Candela M.D.   On: 10/31/2023 04:34   CT Head Wo Contrast  Result Date: 10/31/2023 CLINICAL DATA:  Mental status change with unknown cause. Suspected sepsis. EXAM: CT HEAD WITHOUT CONTRAST TECHNIQUE: Contiguous axial images were obtained from the base of the skull through the vertex without intravenous contrast. RADIATION DOSE REDUCTION: This exam was performed according to the departmental  dose-optimization program which includes automated exposure control, adjustment of the mA and/or kV according to patient size and/or use of iterative reconstruction technique. COMPARISON:  None Available. FINDINGS: Brain: No evidence of acute infarction, hemorrhage, hydrocephalus, extra-axial collection or mass lesion/mass effect. Vascular: No hyperdense vessel or unexpected calcification. Skull: Normal. Negative for fracture or focal lesion. Sinuses/Orbits: Fluid level in the left sphenoid sinus where there is background wall sclerosis suggesting chronic sinusitis. IMPRESSION: 1. Normal appearance of the brain. 2. Small left sphenoid sinus fluid level. Electronically Signed   By: Tiburcio Pea M.D.   On: 10/31/2023 04:08    Anti-infectives: Anti-infectives (From admission, onward)    Start     Dose/Rate Route Frequency Ordered Stop   10/31/23 0600  piperacillin-tazobactam (ZOSYN) IVPB 3.375 g  Status:  Discontinued        3.375 g 100 mL/hr over 30 Minutes Intravenous Every 6 hours 10/31/23 0324 10/31/23 0339   10/31/23 0600  piperacillin-tazobactam (ZOSYN) IVPB 3.375 g  Status:  Discontinued        3.375 g 12.5 mL/hr over 240 Minutes Intravenous Every 8 hours 10/31/23 0339 11/01/23 0733   10/31/23 0045  vancomycin (VANCOREADY) IVPB 2000 mg/400 mL        2,000 mg 200 mL/hr over 120 Minutes Intravenous  Once 10/31/23 0044 10/31/23 0343   10/31/23 0045  ceFEPIme (MAXIPIME) 2 g in sodium chloride 0.9 % 100 mL IVPB        2 g 200 mL/hr over 30 Minutes Intravenous  Once 10/31/23 0044 10/31/23 0223        Assessment/Plan Appendicoliths -patient does not clinically have appendicitis.  Suspect this was an incidental find on imaging. -normal WBC and AF -no empiric abx needed from our standpoint. -may have a diet from our standpoint -no further surgical needs.  We will sign off. -d/w primary service.   Hepatic encephalopathy ETOH abuse ETOH pancreatitis,  Hx of HTN Severe hepatic steatosis  with significant liver dysfunction, MELD 22, Child Pugh C Morbid obesity  Ileus  I reviewed hospitalist notes, last 24 h vitals and pain scores, last 48 h intake and output, last 24 h labs and trends, and last 24 h imaging results.   LOS: 1 day    Letha Cape , [facility] 11/01/2023, 7:55 AM Please see Amion for pager number during day hours 7:00am-4:30pm or 7:00am -11:30am on weekends

## 2023-11-02 DIAGNOSIS — K7682 Hepatic encephalopathy: Secondary | ICD-10-CM

## 2023-11-02 LAB — AMMONIA: Ammonia: 43 umol/L — ABNORMAL HIGH (ref 9–35)

## 2023-11-02 LAB — CBC
HCT: 36.2 % — ABNORMAL LOW (ref 39.0–52.0)
Hemoglobin: 12.3 g/dL — ABNORMAL LOW (ref 13.0–17.0)
MCH: 33.9 pg (ref 26.0–34.0)
MCHC: 34 g/dL (ref 30.0–36.0)
MCV: 99.7 fL (ref 80.0–100.0)
Platelets: 122 10*3/uL — ABNORMAL LOW (ref 150–400)
RBC: 3.63 MIL/uL — ABNORMAL LOW (ref 4.22–5.81)
RDW: 14.5 % (ref 11.5–15.5)
WBC: 5.3 10*3/uL (ref 4.0–10.5)
nRBC: 0 % (ref 0.0–0.2)

## 2023-11-02 LAB — BASIC METABOLIC PANEL
Anion gap: 11 (ref 5–15)
BUN: 12 mg/dL (ref 6–20)
CO2: 19 mmol/L — ABNORMAL LOW (ref 22–32)
Calcium: 8.9 mg/dL (ref 8.9–10.3)
Chloride: 107 mmol/L (ref 98–111)
Creatinine, Ser: 0.56 mg/dL — ABNORMAL LOW (ref 0.61–1.24)
GFR, Estimated: >60 mL/min (ref >60)
Glucose, Bld: 123 mg/dL — ABNORMAL HIGH (ref 70–99)
Potassium: 2.2 mmol/L — CL (ref 3.5–5.1)
Sodium: 137 mmol/L (ref 135–145)

## 2023-11-02 LAB — GLUCOSE, CAPILLARY
Glucose-Capillary: 106 mg/dL — ABNORMAL HIGH (ref 70–99)
Glucose-Capillary: 111 mg/dL — ABNORMAL HIGH (ref 70–99)
Glucose-Capillary: 119 mg/dL — ABNORMAL HIGH (ref 70–99)
Glucose-Capillary: 122 mg/dL — ABNORMAL HIGH (ref 70–99)
Glucose-Capillary: 92 mg/dL (ref 70–99)
Glucose-Capillary: 96 mg/dL (ref 70–99)

## 2023-11-02 LAB — PHOSPHORUS: Phosphorus: 3.2 mg/dL (ref 2.5–4.6)

## 2023-11-02 LAB — MAGNESIUM: Magnesium: 1.8 mg/dL (ref 1.7–2.4)

## 2023-11-02 MED ORDER — POTASSIUM CHLORIDE CRYS ER 20 MEQ PO TBCR
40.0000 meq | EXTENDED_RELEASE_TABLET | Freq: Once | ORAL | Status: AC
  Administered 2023-11-02: 40 meq via ORAL
  Filled 2023-11-02: qty 2

## 2023-11-02 MED ORDER — OFLOXACIN 0.3 % OP SOLN
2.0000 [drp] | Freq: Four times a day (QID) | OPHTHALMIC | Status: DC
  Administered 2023-11-02 – 2023-11-07 (×22): 2 [drp] via OPHTHALMIC
  Filled 2023-11-02: qty 5

## 2023-11-02 MED ORDER — LACTULOSE 10 GM/15ML PO SOLN
20.0000 g | Freq: Three times a day (TID) | ORAL | Status: DC
  Administered 2023-11-02 – 2023-11-04 (×9): 20 g
  Filled 2023-11-02 (×9): qty 30

## 2023-11-02 MED ORDER — GERHARDT'S BUTT CREAM
TOPICAL_CREAM | Freq: Three times a day (TID) | CUTANEOUS | Status: DC
  Administered 2023-11-03 – 2023-11-06 (×4): 1 via TOPICAL
  Filled 2023-11-02 (×2): qty 1

## 2023-11-02 MED ORDER — POTASSIUM CHLORIDE 10 MEQ/100ML IV SOLN
10.0000 meq | INTRAVENOUS | Status: AC
  Administered 2023-11-02 (×6): 10 meq via INTRAVENOUS
  Filled 2023-11-02 (×6): qty 100

## 2023-11-02 MED ORDER — THIAMINE HCL 100 MG/ML IJ SOLN
500.0000 mg | Freq: Once | INTRAVENOUS | Status: AC
  Administered 2023-11-02: 500 mg via INTRAVENOUS
  Filled 2023-11-02: qty 5

## 2023-11-02 NOTE — Progress Notes (Addendum)
PROGRESS NOTE    Brian [name]  VFI:[id number] DOB: [birth year] DOA: 10/30/2023 PCP: Patient, No Pcp Per   Brief Narrative:   18M with cirrhosis, alcohol dependence (drinks two 1/5ths of liquor weekly) and severe bilateral lower extremity lymphedema who was admitted for AMS after he was found down at home. Initial CT head is negative. Spot EEG shows diffuse encephalopathy. Ammonia level elevateed at 43. Alcohol level undetectable. CT Abdomen with dilated bowel loops with fluid filled stool and some thickening of the walls concerning for colitis. There is concern for wall thickening of the [place]. General Surgery consulted with low suspicion for appendicitis.    Assessment & Plan:   Active Problems:   Hepatic encephalopathy (HCC)   Acute encephalopathy, hepatic with possible metabolic component Concurrent lactic acidosis, poa -Elevated ammonia improving with lactulose via NG (30g TID) -Lactic acidosis likely related to above -no signs or symptoms of infection -CT head, EEG without overt abnormality -NG tube in place for medication administration, on full liquid diet after SLP evaluation -MRI without overt acute or reversible findings -Patient continues to improve    Alcohol Use Disorder w/ hx of withdrawal w/o seizures  -phenobarbital taper ongoing -Likely playing a role in patient's mental status as above -thiamine, folate, multivitamin per protocol -Continue to follow CIWA protocol   Colitis vs Ileitis -acute appendicitis ruled out -General Surgery consulted at intake, imaging reviewed not acute appendicitis given patient's lack of abdominal findings and clinical status. -Appendicolith noted on CT -NG tube initially placed for decompression   HTN -Currently holding medications, resume once appropriate   Lymphedema -Reportedly chronic, follow up outpatient for further evaluation workup   Possible OSA -Recommend outpatient sleep study   Severe  hypokalemia -Significantly low this morning at 2.2, will replace aggressively   Moderate to severe protein caloric malnutrition, POA -Will start on supplements  Thrombocytopenia -secondary to alcohol  Bilateral  conjunctivitis -Will start on ofloxacin eyedrops  Hyperammonemia -Increase lactulose   Prediabetes -Patient denies history of glucose intolerance, A1c 5.8 -Recommend improved diet and exercise  Skin excoriation -Consult wound care regarding recommendations    DVT prophylaxis: SCDs Start: 10/31/23 0326 Code Status:   Code Status: Full Code Family Communication: None present  Status is: Inpatient  Dispo: The patient is from: Home              Anticipated d/c is to: Home              Anticipated d/c date is: 24 to 48 hours              Patient currently not medically stable for discharge  Consultants:  PCCM  Procedures:  None  Antimicrobials:  None indicated  Subjective: No significant events overnight as discussed with staff, he is tolerating oral intake currently Objective: Vitals:   11/02/23 0311 11/02/23 0330 11/02/23 0800 11/02/23 1354  BP: (!) 117/59  122/62 113/60  Pulse: 95  99 94  Resp: 17  15 16   Temp: 98.2 F (36.8 C)  98.2 F (36.8 C) 98.1 F (36.7 C)  TempSrc: Oral  Oral Oral  SpO2:   97% 94%  Weight:  124.9 kg    Height:        Intake/Output Summary (Last 24 hours) at 11/02/2023 1442 Last data filed at 11/02/2023 0617 Gross per 24 hour  Intake 800 ml  Output 1900 ml  Net -1100 ml   Filed Weights   10/31/23 0500 11/01/23 0500 11/02/23 0330  Weight: 123.6 kg  123.6 kg 124.9 kg    Examination:    Awake Alert, he is talkative, appropriate, extremely frail and deconditioned HEENT with bilateral conjunctivitis Symmetrical Chest wall movement, Good air movement bilaterally, CTAB RRR,No Gallops,Rubs or new Murmurs, No Parasternal Heave +ve B.Sounds, Abd Soft, No tenderness, No rebound - guarding or rigidity. No Cyanosis, he  has chronic lower extremity skin changes, please see pictures below    Data Reviewed: I have personally reviewed following labs and imaging studies  CBC: Recent Labs  Lab 10/30/23 2336 10/31/23 0009 11/01/23 0306 11/02/23 1058  WBC 6.9  --  5.4 5.3  NEUTROABS 5.4  --   --   --   HGB 15.3 16.0 14.0 12.3*  HCT 45.0 47.0 41.7 36.2*  MCV 100.7*  --  100.5* 99.7  PLT 162  --  87* 122*   Basic Metabolic Panel: Recent Labs  Lab 10/30/23 2336 10/31/23 0009 10/31/23 1425 11/01/23 0306 11/02/23 1058  NA 137 142 140 141 137  K 3.0* 2.9* 3.3* 3.0* 2.2*  CL 101  --  106 107 107  CO2 20*  --  21* 23 19*  GLUCOSE 152*  --  120* 112* 123*  BUN 25*  --  19 15 12   CREATININE 1.12  --  0.87 0.76 0.56*  CALCIUM 9.5  --  9.2 9.3 8.9  MG  --   --  2.3  --  1.8  PHOS  --   --   --   --  3.2   GFR: Estimated Creatinine Clearance: 134 mL/min (A) (by C-G formula based on SCr of 0.56 mg/dL (L)). Liver Function Tests: Recent Labs  Lab 10/30/23 2336 10/31/23 1425 11/01/23 0306  AST 44* 39 40  ALT 25 22 25   ALKPHOS 75 66 65  BILITOT 9.4* 9.3* 8.8*  PROT 8.7* 8.0 8.0  ALBUMIN 2.8* 2.6* 2.7*   Recent Labs  Lab 10/30/23 2336  LIPASE 35   Recent Labs  Lab 10/31/23 0127 11/01/23 0306 11/02/23 1313  AMMONIA 42* 72* 43*   Coagulation Profile: Recent Labs  Lab 10/30/23 2336  INR 1.4*   Cardiac Enzymes: No results for input(s): "CKTOTAL", "CKMB", "CKMBINDEX", "TROPONINI" in the last 168 hours. BNP (last 3 results) No results for input(s): "PROBNP" in the last 8760 hours. HbA1C: Recent Labs    10/30/23 2336  HGBA1C 5.8*   CBG: Recent Labs  Lab 11/01/23 1932 11/01/23 2318 11/02/23 0327 11/02/23 0815 11/02/23 1227  GLUCAP 114* 89 92 96 111*   Lipid Profile: No results for input(s): "CHOL", "HDL", "LDLCALC", "TRIG", "CHOLHDL", "LDLDIRECT" in the last 72 hours. Thyroid Function Tests: Recent Labs    10/31/23 0127  TSH 0.162*  FREET4 0.96   Anemia  Panel: Recent Labs    10/31/23 0356  VITAMINB12 439   Sepsis Labs: Recent Labs  Lab 10/31/23 0009 10/31/23 0127 10/31/23 0137 10/31/23 0154 10/31/23 0356  PROCALCITON  --  <0.10  --   --   --   LATICACIDVEN 3.8*  --  >15.0* 0.8 1.1    Recent Results (from the past 240 hour(s))  Culture, blood (Routine x 2)     Status: None (Preliminary result)   Collection Time: 10/30/23 11:34 PM   Specimen: BLOOD LEFT ARM  Result Value Ref Range Status   Specimen Description BLOOD LEFT ARM  Final   Special Requests   Final    BOTTLES DRAWN AEROBIC AND ANAEROBIC Blood Culture adequate volume   Culture   Final  NO GROWTH 3 DAYS Performed at [facility], [address], [place], [place] [address]    Report Status PENDING  Incomplete  Culture, blood (Routine x 2)     Status: None (Preliminary result)   Collection Time: 10/30/23 11:34 PM   Specimen: BLOOD RIGHT ARM  Result Value Ref Range Status   Specimen Description BLOOD RIGHT ARM  Final   Special Requests   Final    BOTTLES DRAWN AEROBIC AND ANAEROBIC Blood Culture adequate volume   Culture   Final    NO GROWTH 3 DAYS Performed at [facility], [address], [place], [place] [address]    Report Status PENDING  Incomplete  Urine Culture     Status: None   Collection Time: 10/31/23  2:56 AM   Specimen: Urine, Catheterized  Result Value Ref Range Status   Specimen Description URINE, CATHETERIZED  Final   Special Requests NONE  Final   Culture   Final    NO GROWTH Performed at [facility], [address] [place], [place] [address]    Report Status 11/01/2023 FINAL  Final  MRSA Next Gen by PCR, Nasal     Status: None   Collection Time: 10/31/23  3:56 AM   Specimen: Nasal Mucosa; Nasal Swab  Result Value Ref Range Status   MRSA by PCR Next Gen NOT DETECTED NOT DETECTED Final    Comment: (NOTE) The GeneXpert MRSA Assay (FDA approved for NASAL specimens only), is one component of a comprehensive MRSA  colonization surveillance program. It is not intended to diagnose MRSA infection nor to guide or monitor treatment for MRSA infections. Test performance is not FDA approved in patients less than 7 years old. Performed at [facility], [address], [place], [place] [address]          Radiology Studies: MR BRAIN WO CONTRAST  Result Date: 10/31/2023 CLINICAL DATA:  Mental status change of unknown cause. Difficulty holding still. EXAM: MRI HEAD WITHOUT CONTRAST TECHNIQUE: Multiplanar, multiecho pulse sequences of the brain and surrounding structures were obtained without intravenous contrast. COMPARISON:  Head CT earlier same day FINDINGS: Brain: The study suffers from motion degradation. No diffusion abnormality is identified, though motion degradation could obscure a subtle insult. Brain otherwise appears similarly normal without evidence of stroke, mass, hemorrhage, hydrocephalus or extra-axial collection. Because of the motion, a subtle lesion could be inapparent. Vascular: Major vessels at the base of the brain show flow. Skull and upper cervical spine: Negative Sinuses/Orbits: Sinuses are clear except for a small amount of fluid layering in the sphenoid sinus. Orbits negative. Other: None IMPRESSION: 1. Motion degraded study. No acute or reversible finding. Because of the motion, a subtle lesion could be inapparent. 2. Small amount of fluid layering in the sphenoid sinus. Electronically Signed   By: Paulina Fusi M.D.   On: 10/31/2023 17:19    Scheduled Meds:  Chlorhexidine Gluconate Cloth  6 each Topical Daily   enoxaparin (LOVENOX) injection  60 mg Subcutaneous Q24H   folic acid  1 mg Per Tube Daily   influenza vac split trivalent PF  0.5 mL Intramuscular Tomorrow-1000   insulin aspart  0-9 Units Subcutaneous Q4H   lactulose  20 g Per Tube TID   multivitamin with minerals  1 tablet Per Tube Daily   ofloxacin  2 drop Both Eyes QID   phenobarbital  32.4 mg Per Tube Q8H    potassium chloride  40 mEq Oral Once  tamsulosin  0.4 mg Oral Daily   thiamine  100 mg Per Tube Daily   Continuous Infusions:  potassium chloride        LOS: 2 days    Huey Bienenstock, MD [facility]  If 7PM-7AM, please contact night-coverage [email or web address]  11/02/2023, 2:42 PM

## 2023-11-02 NOTE — Progress Notes (Signed)
PT Cancellation Note  Patient Details Name: Brian [name] MRN: [id number] DOB: 1961-07-24   Cancelled Treatment:    Reason Eval/Treat Not Completed: Medical issues which prohibited therapy (Evaluating therapist asked for assistance with pt lymphedema. Currently pt potassium 2.2; will hold at this time. Will continue to follow up as able and appropriate.)  [name], DPT, CLT  Acute Rehabilitation Services Office: [phone] (Secure chat preferred)   [name] 11/02/2023, 3:15 PM

## 2023-11-02 NOTE — Evaluation (Signed)
Occupational Therapy Evaluation Patient Details Name: Brian [name] MRN: [id number] DOB: [birth year] Today's Date: 11/02/2023   History of Present Illness 48M with cirrhosis, alcohol dependence (drinks two 1/5ths of liquor weekly) and severe bilateral lower extremity lymphedema who was admitted for AMS after he was found down at home. Initial CT head is negative. Spot EEG shows diffuse encephalopathy. PMHx: Alcoholic, pancreatitis, HTN, morbid obesity   Clinical Impression   Velma was evaluated s/p the above admission list. He is mod I with DME at baseline, although he reports that family was assisting with ADLs and transfers as needed. Upon evaluation the pt was limited by BLE swelling, BLE weakness, impaired cognition, activity tolerance, and elevated HR to 138 with standing attempts (RN present and aware). Overall he needed mod-max A for bed mobility but had good unsupported sitting balance. Pt was eager to stand, however he was unable to get fully upright despite max/total A and multiple attempts - pt able to clear bottom off of bed 3x. Due to the deficits listed below the pt also needs up to total A for LB ADLs and set up A for UB ADLs in sitting. Pt will benefit from continued acute OT services and skilled inpatient follow up therapy, <3 hours/day.        If plan is discharge home, recommend the following: A lot of help with walking and/or transfers;Two people to help with walking and/or transfers;A lot of help with bathing/dressing/bathroom;Two people to help with bathing/dressing/bathroom;Assistance with cooking/housework;Direct supervision/assist for financial management;Direct supervision/assist for medications management;Assist for transportation;Help with stairs or ramp for entrance    Functional Status Assessment  Patient has had a recent decline in their functional status and demonstrates the ability to make significant improvements in function in a reasonable and predictable  amount of time.  Equipment Recommendations  None recommended by OT       Precautions / Restrictions Precautions Precautions: Fall Precaution Comments: NG tube and flexiseal Restrictions Weight Bearing Restrictions: No      Mobility Bed Mobility Overal bed mobility: Needs Assistance Bed Mobility: Supine to Sit, Sit to Supine     Supine to sit: Mod assist Sit to supine: Max assist   General bed mobility comments: benefits from simple 1 step cues to sequence task, majority of assist for BLES    Transfers Overall transfer level: Needs assistance Equipment used: Rolling walker (2 wheels) Transfers: Sit to/from Stand Sit to Stand: Total assist           General transfer comment: unable to successfully stand with full upright posture despite maximal assist and multiple attempts - may benefit frm stedy use      Balance Overall balance assessment: Needs assistance Sitting-balance support: Feet supported Sitting balance-Leahy Scale: Good Sitting balance - Comments: able to correct when balance is challenged               ADL either performed or assessed with clinical judgement   ADL Overall ADL's : Needs assistance/impaired Eating/Feeding: Independent   Grooming: Set up;Sitting   Upper Body Bathing: Set up;Sitting   Lower Body Bathing: Maximal assistance;Sitting/lateral leans   Upper Body Dressing : Set up;Sitting   Lower Body Dressing: Total assistance   Toilet Transfer: Total assistance Toilet Transfer Details (indicate cue type and reason): pt may benefit from stedy Toileting- Clothing Manipulation and Hygiene: Total assistance Toileting - Clothing Manipulation Details (indicate cue type and reason): flexi currently     Functional mobility during ADLs: Maximal assistance;+2 for safety/equipment;+2 for physical  assistance (bed level) General ADL Comments: limited by weakness, body habitus, unable to successfully stand EOB, good sitting balance      Vision Baseline Vision/History: 0 No visual deficits Vision Assessment?: No apparent visual deficits     Perception Perception: Not tested       Praxis Praxis: Not tested       Pertinent Vitals/Pain Pain Assessment Pain Assessment: No/denies pain     Extremity/Trunk Assessment Upper Extremity Assessment Upper Extremity Assessment: Generalized weakness   Lower Extremity Assessment Lower Extremity Assessment: Defer to PT evaluation   Cervical / Trunk Assessment Cervical / Trunk Assessment: Other exceptions Cervical / Trunk Exceptions: increased body habitus   Communication Communication Communication: No apparent difficulties   Cognition Arousal: Alert Behavior During Therapy: WFL for tasks assessed/performed Overall Cognitive Status: Impaired/Different from baseline Area of Impairment: Attention, Safety/judgement, Awareness, Problem solving                   Current Attention Level: Selective     Safety/Judgement: Decreased awareness of safety, Decreased awareness of deficits Awareness: Emergent Problem Solving: Slow processing, Requires verbal cues General Comments: basic orientation was [facility], pt followed all simple 1 step commands. very limited insight into deficits and safety, although eager to participate     General Comments  HR to 138, RN present            Home Living Family/patient expects to be discharged to:: Private residence Living Arrangements: Spouse/significant other Available Help at Discharge: Family;Available 24 hours/day Type of Home: House Home Access: Stairs to enter [employer] of Steps: 1 Entrance Stairs-Rails: None Home Layout: Two level;Able to live on main level with bedroom/bathroom Alternate Level Stairs-Number of Steps: flight and a landing Alternate Level Stairs-Rails: Can reach both Bathroom Shower/Tub: Sponge bathes at baseline   Bathroom Toilet: Handicapped height Bathroom Accessibility: Yes    Home Equipment: Agricultural consultant (2 wheels);Rollator (4 wheels);Cane - single point;Hand held shower head          Prior Functioning/Environment Prior Level of Function : Needs assist;Working/employed;Driving             Mobility Comments: Pt states he was able to ambulate short distances around the house sometimes with a RW sometimes without it. WC for community distances. Pt states he needs help getting up from low surfaces. ADLs Comments: pt states that family was assisting with ADLs as needed, most assist needed for toilet transfers adn LB ADLs        OT Problem List: Decreased strength;Decreased range of motion;Decreased activity tolerance;Impaired balance (sitting and/or standing);Decreased cognition;Decreased knowledge of use of DME or AE;Decreased safety awareness;Decreased knowledge of precautions      OT Treatment/Interventions: Self-care/ADL training;Therapeutic exercise;DME and/or AE instruction;Therapeutic activities;Patient/family education    OT Goals(Current goals can be found in the care plan section) Acute Rehab OT Goals Patient Stated Goal: to get better OT Goal Formulation: With patient Time For Goal Achievement: 11/16/23 Potential to Achieve Goals: Good ADL Goals Pt Will Perform Lower Body Bathing: with set-up;sitting/lateral leans;with adaptive equipment Pt Will Perform Lower Body Dressing: with mod assist;sitting/lateral leans;with adaptive equipment Pt Will Transfer to Toilet: with mod assist;stand pivot transfer;bedside commode Pt Will Perform Toileting - Clothing Manipulation and hygiene: with min assist;sitting/lateral leans Additional ADL Goal #1: Pt will complete bed mobility with min A as a precursor to ADLs  OT Frequency: Min 1X/week       AM-PAC OT "6 Clicks" Daily Activity     Outcome Measure Help from  another person eating meals?: None Help from another person taking care of personal grooming?: A Little Help from another person toileting,  which includes using toliet, bedpan, or urinal?: Total Help from another person bathing (including washing, rinsing, drying)?: A Lot Help from another person to put on and taking off regular upper body clothing?: A Little Help from another person to put on and taking off regular lower body clothing?: Total 6 Click Score: 14   End of Session Equipment Utilized During Treatment: Rolling walker (2 wheels) Nurse Communication: Mobility status  Activity Tolerance: Patient tolerated treatment well Patient left: in bed;with call bell/phone within reach;with bed alarm set  OT Visit Diagnosis: Unsteadiness on feet (R26.81);Other abnormalities of gait and mobility (R26.89);Muscle weakness (generalized) (M62.81)                Time: 4010-2725 OT Time Calculation (min): 29 min Charges:  OT General Charges $OT Visit: 1 Visit OT Evaluation $OT Eval Moderate Complexity: 1 Mod OT Treatments $Therapeutic Activity: 8-22 mins  Derenda Mis, OTR/L Acute Rehabilitation Services Office [phone] Secure Chat Communication Preferred   Donia Pounds 11/02/2023, 4:08 PM

## 2023-11-02 NOTE — TOC Progression Note (Signed)
Transition of Care [facility]) - Progression Note    Patient Details  Name: Brian [name] MRN: [id number] Date of Birth: 04/08/1961  [facility] CM/SW Contact  Eustace Hur Reeves Forth, Student-Social Work Phone Number: 11/02/2023, 3:13 PM  Clinical Narrative:    MSW Intern spoke with pt about alcohol use. MSW Intern offered pt resources- patient accepted.        Expected Discharge Plan and Services                                               Social Determinants of Health (SDOH) Interventions SDOH Screenings   Food Insecurity: No Food Insecurity (04/01/2023)  Housing: Patient Unable To Answer (04/01/2023)  Transportation Needs: Unmet Transportation Needs (04/01/2023)  Utilities: Not At Risk (04/01/2023)  Tobacco Use: High Risk (10/31/2023)    Readmission Risk Interventions     No data to display

## 2023-11-02 NOTE — Progress Notes (Signed)
Pt hasn't been out the bed since admission. As per patient, he is using a walker at home. Given his lymphedema on bilateral lower extremities, will defer to PT assessment and evaluation prior to ambulation.

## 2023-11-02 NOTE — Evaluation (Signed)
Physical Therapy Evaluation Patient Details Name: Brian [name] MRN: [id number] DOB: 30-Dec-1960 Today's Date: 11/02/2023  History of Present Illness  70M with cirrhosis, alcohol dependence (drinks two 1/5ths of liquor weekly) and severe bilateral lower extremity lymphedema who was admitted for AMS after he was found down at home. Initial CT head is negative. Spot EEG shows diffuse encephalopathy.  Clinical Impression  Pt presents with admitting diagnosis above. Eval limited today due to patient being found in bed soiled with flexiseal dislodged. RN made aware and mobility deferred until new flexiseal is placed. Pt reports PTA he was able to ambulate household distances sometimes with a RW sometimes without however used WC for community distances. DC recs deferred until OOB assessment. Will try again later today if times allows however pt will benefit from a lymphedema therapist during acute stay.      If plan is discharge home, recommend the following:     Can travel by private vehicle        Equipment Recommendations    Recommendations for Other Services       Functional Status Assessment       Precautions / Restrictions Precautions Precautions: Fall;Other (comment) Precaution Comments: NG tube and flexiseal Restrictions Weight Bearing Restrictions: No      Mobility  Bed Mobility               General bed mobility comments: Deferred due to pt being found soil with flexiseal removed.    Transfers                        Ambulation/Gait                  Stairs            Wheelchair Mobility     Tilt Bed    Modified Rankin (Stroke Patients Only)       Balance                                             Pertinent Vitals/Pain Pain Assessment Pain Assessment: No/denies pain    Home Living Family/patient expects to be discharged to:: Private residence Living Arrangements: Spouse/significant other Available  Help at Discharge: Family;Available 24 hours/day Type of Home: House Home Access: Stairs to enter Entrance Stairs-Rails: None Entrance Stairs-Number of Steps: 1 Alternate Level Stairs-Number of Steps: flight and a landing Home Layout: Two level;Able to live on main level with bedroom/bathroom Home Equipment: Rolling Walker (2 wheels);Rollator (4 wheels);Cane - single point;Hand held shower head      Prior Function Prior Level of Function : Needs assist;History of Falls (last six months);Working/employed;Driving             Mobility Comments: Pt states he was able to ambulate short distances around the house sometimes with a RW sometimes without it. WC for community distances. Pt states he needs help getting up from low surfaces. ADLs Comments: Ind     Extremity/Trunk Assessment                Communication   Communication Communication: No apparent difficulties Cueing Techniques: Verbal cues;Tactile cues  Cognition Arousal: Alert Behavior During Therapy: WFL for tasks assessed/performed, Flat affect Overall Cognitive Status: Impaired/Different from baseline Area of Impairment: Orientation, Awareness  Orientation Level: Disoriented to, Time         Awareness: Emergent   General Comments: A&Ox3. Pt reported the year as 1924 and required cues from family member.        General Comments General comments (skin integrity, edema, etc.): Pt found soiled with flexiseal removed. RN notified and further mobility deferred until flexiseal is replaced.    Exercises     Assessment/Plan    PT Assessment  (TBD)  PT Problem List         PT Treatment Interventions      PT Goals (Current goals can be found in the Care Plan section)       Frequency       Co-evaluation               AM-PAC PT "6 Clicks" Mobility  Outcome Measure                  End of Session              Time: 8657-8469 PT Time Calculation (min)  (ACUTE ONLY): 29 min   Charges:   PT Evaluation $PT Eval Moderate Complexity: 1 Mod   PT General Charges $$ ACUTE PT VISIT: 1 Visit         Shela Nevin, PT, DPT Acute Rehab Services [phone]   [name] 11/02/2023, 10:34 AM

## 2023-11-02 NOTE — Consult Note (Addendum)
WOC Nurse Consult Note: Reason for Consult: excoriation of buttocks  Wound type: Moisture Associated Skin Damage  ICD-10 CM Codes for Irritant Dermatitis  L24A2 - Due to fecal, urinary or dual incontinence Pressure Injury POA: Yes/No/NA Measurement: widespread buttocks and upper posterior thighs  Wound bed: erythema, partial thickness skin loss  Drainage (amount, consistency, odor)  Periwound: erythema  Dressing procedure/placement/frequency: Cleanse buttocks/upper posterior thighs with Vashe wound cleanser [employer] [place] [phone]), apply Gerhardt's Butt Cream to entire area 3 times a day and prn soiling.  Sprinkle over Gerhardt's with floor stock antifungal powder (microguard green and white label).   Patient would benefit from low air loss mattress for moisture management.   POC discussed with bedside nurse. WOC team will not follow. Re-consult if further needs arise.   Thank you,    Priscella Mann MSN, RN-BC, [employer] [phone]

## 2023-11-03 LAB — BASIC METABOLIC PANEL
Anion gap: 9 (ref 5–15)
Anion gap: 9 (ref 5–15)
BUN: 8 mg/dL (ref 6–20)
BUN: 9 mg/dL (ref 6–20)
CO2: 19 mmol/L — ABNORMAL LOW (ref 22–32)
CO2: 18 mmol/L — ABNORMAL LOW (ref 22–32)
Calcium: 8.7 mg/dL — ABNORMAL LOW (ref 8.9–10.3)
Calcium: 8.5 mg/dL — ABNORMAL LOW (ref 8.9–10.3)
Chloride: 110 mmol/L (ref 98–111)
Chloride: 108 mmol/L (ref 98–111)
Creatinine, Ser: 0.48 mg/dL — ABNORMAL LOW (ref 0.61–1.24)
Creatinine, Ser: 0.59 mg/dL — ABNORMAL LOW (ref 0.61–1.24)
GFR, Estimated: >60 mL/min (ref >60)
GFR, Estimated: >60 mL/min (ref >60)
Glucose, Bld: 117 mg/dL — ABNORMAL HIGH (ref 70–99)
Glucose, Bld: 136 mg/dL — ABNORMAL HIGH (ref 70–99)
Potassium: 2.3 mmol/L — CL (ref 3.5–5.1)
Potassium: 2.8 mmol/L — ABNORMAL LOW (ref 3.5–5.1)
Sodium: 138 mmol/L (ref 135–145)
Sodium: 135 mmol/L (ref 135–145)

## 2023-11-03 LAB — GLUCOSE, CAPILLARY
Glucose-Capillary: 122 mg/dL — ABNORMAL HIGH (ref 70–99)
Glucose-Capillary: 106 mg/dL — ABNORMAL HIGH (ref 70–99)
Glucose-Capillary: 212 mg/dL — ABNORMAL HIGH (ref 70–99)
Glucose-Capillary: 176 mg/dL — ABNORMAL HIGH (ref 70–99)
Glucose-Capillary: 117 mg/dL — ABNORMAL HIGH (ref 70–99)

## 2023-11-03 LAB — CBC
HCT: 35.9 % — ABNORMAL LOW (ref 39.0–52.0)
Hemoglobin: 12.3 g/dL — ABNORMAL LOW (ref 13.0–17.0)
MCH: 33.8 pg (ref 26.0–34.0)
MCHC: 34.3 g/dL (ref 30.0–36.0)
MCV: 98.6 fL (ref 80.0–100.0)
Platelets: 126 10*3/uL — ABNORMAL LOW (ref 150–400)
RBC: 3.64 MIL/uL — ABNORMAL LOW (ref 4.22–5.81)
RDW: 14 % (ref 11.5–15.5)
WBC: 5 10*3/uL (ref 4.0–10.5)
nRBC: 0 % (ref 0.0–0.2)

## 2023-11-03 LAB — PHOSPHORUS: Phosphorus: 3.1 mg/dL (ref 2.5–4.6)

## 2023-11-03 LAB — MAGNESIUM: Magnesium: 1.7 mg/dL (ref 1.7–2.4)

## 2023-11-03 MED ORDER — POTASSIUM CHLORIDE 2 MEQ/ML IV SOLN
INTRAVENOUS | Status: AC
  Filled 2023-11-03: qty 1000

## 2023-11-03 MED ORDER — POTASSIUM CHLORIDE 10 MEQ/100ML IV SOLN
10.0000 meq | Freq: Once | INTRAVENOUS | Status: AC
  Administered 2023-11-03: 10 meq via INTRAVENOUS
  Filled 2023-11-03: qty 100

## 2023-11-03 MED ORDER — MAGNESIUM SULFATE IN D5W 1-5 GM/100ML-% IV SOLN
1.0000 g | Freq: Once | INTRAVENOUS | Status: AC
  Administered 2023-11-03: 1 g via INTRAVENOUS
  Filled 2023-11-03: qty 100

## 2023-11-03 MED ORDER — POTASSIUM CHLORIDE 10 MEQ/100ML IV SOLN
10.0000 meq | INTRAVENOUS | Status: AC
  Administered 2023-11-03 (×3): 10 meq via INTRAVENOUS
  Filled 2023-11-03 (×3): qty 100

## 2023-11-03 MED ORDER — POTASSIUM CHLORIDE 20 MEQ PO PACK
40.0000 meq | PACK | ORAL | Status: AC
  Administered 2023-11-03 (×3): 40 meq
  Filled 2023-11-03 (×3): qty 2

## 2023-11-03 NOTE — TOC Initial Note (Signed)
Transition of Care [facility]) - Initial/Assessment Note    Patient Details  Name: Brian [name] MRN: [id number] Date of Birth: [birth year]  Transition of [facility]) CM/SW Contact:    Lockie Pares, RN Phone Number: 11/03/2023, 10:55 AM  Clinical Narrative:      High readmission rate            62 year old presented with encephalopathy, Cirrhosis, ETOH. He has lymphadema and skin breakdown on buttocks and back of legs.Wife found him on the couch in urine nad feces and called 911. Prior to this  he was ambulating with walker short distances and using wheelchair for longer distances. PT is recommending SNF, patient currently has no insurance listed.  Called wife Ms Ala Dach for collateral information and discharge planning. Left message for her to call this RNCM back.   TOC will follow for needs, recommendations and transitions of care.    Barriers to Discharge: Inadequate or no insurance   Patient Goals and CMS Choice    DC Wife to call back, if consents would give his information to [facility] for medicaid application Possible LOG for SNF [facility] home care if progresses         Expected Discharge Plan and Services In-house Referral: Clinical Social Work Discharge Planning Services: CM Consult   Living arrangements for the past 2 months: Single Family Home                                      Prior Living Arrangements/Services Living arrangements for the past 2 months: Single Family Home Lives with:: Spouse Patient language and need for interpreter reviewed:: Yes        Need for Family Participation in Patient Care: Yes (Comment) Care giver support system in place?: Yes (comment) Current home services: DME Criminal Activity/Legal Involvement Pertinent to Current Situation/Hospitalization: No - Comment as needed  Activities of Daily Living   ADL Screening (condition at time of admission) Independently performs ADLs?: Yes (appropriate for developmental age) Is the  patient deaf or have difficulty hearing?: No Does the patient have difficulty seeing, even when wearing glasses/contacts?: No Does the patient have difficulty concentrating, remembering, or making decisions?: No  Permission Sought/Granted                  Emotional Assessment   Attitude/Demeanor/Rapport: Lethargic     Alcohol / Substance Use: Alcohol Use Psych Involvement: No (comment)  Admission diagnosis:  Sepsis (HCC) [A41.9] Patient Active Problem List   Diagnosis Date Noted   Hepatic encephalopathy (HCC) 10/31/2023   Hypokalemia 04/01/2023   Transaminitis 04/01/2023   Hyperbilirubinemia 04/01/2023   Alcoholic ketoacidosis 04/01/2023   Hypoalbuminemia due to protein-calorie malnutrition (HCC) 04/01/2023   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 04/01/2023   Alcohol abuse 04/01/2023   Hypothermia 04/01/2023   Essential hypertension 04/01/2023   Acute pancreatitis 03/31/2023   PCP:  Patient, No Pcp Per Pharmacy:   [id number] - [place], [place] - [address] [facility] [address] [place] [address] Phone: [phone] Fax: [phone]     Social Determinants of Health (SDOH) Social History: SDOH Screenings   Food Insecurity: No Food Insecurity (04/01/2023)  Housing: Patient Unable To Answer (04/01/2023)  Transportation Needs: Unmet Transportation Needs (04/01/2023)  Utilities: Not At Risk (04/01/2023)  Tobacco Use: High Risk (10/31/2023)   SDOH Interventions:  Readmission Risk Interventions     No data to display

## 2023-11-03 NOTE — Progress Notes (Signed)
Inpatient Rehab Admissions Coordinator Note:   Per therapy recommendations patient was screened for CIR candidacy by Stephania Fragmin, PT. At this time, pt appears to be a potential candidate for CIR. I will place an order for rehab consult for full assessment, per our protocol.  Please contact me any with questions.Estill Dooms, PT, DPT [phone] 11/03/23 12:17 PM

## 2023-11-03 NOTE — Plan of Care (Signed)
  Problem: Education: Goal: Ability to describe self-care measures that may prevent or decrease complications (Diabetes Survival Skills Education) will improve Outcome: Progressing   Problem: Coping: Goal: Ability to adjust to condition or change in health will improve Outcome: Progressing   Problem: Fluid Volume: Goal: Ability to maintain a balanced intake and output will improve Outcome: Progressing   Problem: Health Behavior/Discharge Planning: Goal: Ability to identify and utilize available resources and services will improve Outcome: Progressing   Problem: Metabolic: Goal: Ability to maintain appropriate glucose levels will improve Outcome: Progressing   Problem: Nutritional: Goal: Maintenance of adequate nutrition will improve Outcome: Progressing   Problem: Skin Integrity: Goal: Risk for impaired skin integrity will decrease Outcome: Progressing   Problem: Tissue Perfusion: Goal: Adequacy of tissue perfusion will improve Outcome: Progressing   

## 2023-11-03 NOTE — Progress Notes (Addendum)
PROGRESS NOTE    Brian [name]  GMW:[id number] DOB: [birth year] DOA: 10/30/2023 PCP: Patient, No Pcp Per   Brief Narrative:   73M with cirrhosis, alcohol dependence (drinks two 1/5ths of liquor weekly) and severe bilateral lower extremity lymphedema who was admitted for AMS after he was found down at home. Initial CT head is negative. Spot EEG shows diffuse encephalopathy. Ammonia level elevateed at 43. Alcohol level undetectable. CT Abdomen with dilated bowel loops with fluid filled stool and some thickening of the walls concerning for colitis. There is concern for wall thickening of the [place]. General Surgery consulted with low suspicion for appendicitis.    Assessment & Plan:   Active Problems:   Hepatic encephalopathy (HCC)   Acute encephalopathy, hepatic with possible metabolic component Concurrent lactic acidosis, poa -Elevated ammonia improving with lactulose via NG (30g TID) -Lactic acidosis likely related to above -no signs or symptoms of infection -CT head, EEG without overt abnormality -NG tube in place for medication administration, on full liquid diet after SLP evaluation -MRI without overt acute or reversible findings -Patient continues to improve -His oral intake is reliable today, likely will DC his NG tube.    Alcohol Use Disorder w/ hx of withdrawal w/o seizures  -phenobarbital taper ongoing -Likely playing a role in patient's mental status as above -thiamine, folate, multivitamin per protocol -Continue to follow CIWA protocol   Colitis vs Ileitis -acute appendicitis ruled out -General Surgery consulted at intake, imaging reviewed not acute appendicitis given patient's lack of abdominal findings and clinical status. -Appendicolith noted on CT -NG tube initially placed for decompression   HTN -Currently holding medications, resume once appropriate   Lymphedema -Reportedly chronic, follow up outpatient for further evaluation workup   Possible  OSA -Recommend outpatient sleep study   Severe hypokalemia -Significantly low this morning at 2.2, will replace aggressively   Moderate to severe protein caloric malnutrition, POA -Will start on supplements -Will consult nutritionist  Thrombocytopenia -secondary to alcohol, improving  Bilateral  conjunctivitis -on ofloxacin eyedrops  Hypokalemia -Potassium remains significantly low today at 2.8, repleted both IV and p.o.  Hyperammonemia -Improving after increasing his lactulose dose  Prediabetes -Patient denies history of glucose intolerance, A1c 5.8 -Recommend improved diet and exercise  Skin excoriation -Consult wound care regarding recommendations    DVT prophylaxis: SCDs Start: 10/31/23 0326 Code Status:   Code Status: Full Code Family Communication: None present  Status is: Inpatient  Dispo: The patient is from: Home              Anticipated d/c is to: Home              Anticipated d/c date is: 24 to 48 hours              Patient currently not medically stable for discharge  Consultants:  PCCM  Procedures:  None  Antimicrobials:  None indicated  Subjective: No significant events overnight, denies any complaints today. Objective: Vitals:   11/02/23 2000 11/03/23 0000 11/03/23 0334 11/03/23 0800  BP: (!) 142/94 (!) 148/73 113/61 128/71  Pulse: (!) 102 (!) 102 98 100  Resp: 19 19 20 20   Temp: 98.2 F (36.8 C) 98.2 F (36.8 C) 98.3 F (36.8 C) 98 F (36.7 C)  TempSrc: Oral Oral Oral Oral  SpO2: 97% 97% 95% 96%  Weight:      Height:        Intake/Output Summary (Last 24 hours) at 11/03/2023 1523 Last data filed at 11/03/2023 0505 Gross per  24 hour  Intake 465.94 ml  Output 2801 ml  Net -2335.06 ml   Filed Weights   10/31/23 0500 11/01/23 0500 11/02/23 0330  Weight: 123.6 kg 123.6 kg 124.9 kg    Examination:    Awake Alert, he is talkative, appropriate, extremely frail and deconditioned HEENT with bilateral  conjunctivitis Symmetrical Chest wall movement, Good air movement bilaterally, CTAB RRR,No Gallops,Rubs or new Murmurs, No Parasternal Heave +ve B.Sounds, Abd Soft, No tenderness, No rebound - guarding or rigidity. No Cyanosis, he has chronic lower extremity skin changes, please see pictures below    Data Reviewed: I have personally reviewed following labs and imaging studies  CBC: Recent Labs  Lab 10/30/23 2336 10/31/23 0009 11/01/23 0306 11/02/23 1058 11/03/23 0347  WBC 6.9  --  5.4 5.3 5.0  NEUTROABS 5.4  --   --   --   --   HGB 15.3 16.0 14.0 12.3* 12.3*  HCT 45.0 47.0 41.7 36.2* 35.9*  MCV 100.7*  --  100.5* 99.7 98.6  PLT 162  --  87* 122* 126*   Basic Metabolic Panel: Recent Labs  Lab 10/31/23 1425 11/01/23 0306 11/02/23 1058 11/03/23 0347 11/03/23 1403  NA 140 141 137 138 135  K 3.3* 3.0* 2.2* 2.3* 2.8*  CL 106 107 107 110 108  CO2 21* 23 19* 19* 18*  GLUCOSE 120* 112* 123* 117* 136*  BUN 19 15 12 8 9   CREATININE 0.87 0.76 0.56* 0.48* 0.59*  CALCIUM 9.2 9.3 8.9 8.7* 8.5*  MG 2.3  --  1.8 1.7  --   PHOS  --   --  3.2 3.1  --    GFR: Estimated Creatinine Clearance: 134 mL/min (A) (by C-G formula based on SCr of 0.59 mg/dL (L)). Liver Function Tests: Recent Labs  Lab 10/30/23 2336 10/31/23 1425 11/01/23 0306  AST 44* 39 40  ALT 25 22 25   ALKPHOS 75 66 65  BILITOT 9.4* 9.3* 8.8*  PROT 8.7* 8.0 8.0  ALBUMIN 2.8* 2.6* 2.7*   Recent Labs  Lab 10/30/23 2336  LIPASE 35   Recent Labs  Lab 10/31/23 0127 11/01/23 0306 11/02/23 1313  AMMONIA 42* 72* 43*   Coagulation Profile: Recent Labs  Lab 10/30/23 2336  INR 1.4*   Cardiac Enzymes: No results for input(s): "CKTOTAL", "CKMB", "CKMBINDEX", "TROPONINI" in the last 168 hours. BNP (last 3 results) No results for input(s): "PROBNP" in the last 8760 hours. HbA1C: No results for input(s): "HGBA1C" in the last 72 hours.  CBG: Recent Labs  Lab 11/02/23 2024 11/02/23 2346 11/03/23 0330  11/03/23 0753 11/03/23 1439  GLUCAP 119* 106* 122* 106* 212*   Lipid Profile: No results for input(s): "CHOL", "HDL", "LDLCALC", "TRIG", "CHOLHDL", "LDLDIRECT" in the last 72 hours. Thyroid Function Tests: No results for input(s): "TSH", "T4TOTAL", "FREET4", "T3FREE", "THYROIDAB" in the last 72 hours.  Anemia Panel: No results for input(s): "VITAMINB12", "FOLATE", "FERRITIN", "TIBC", "IRON", "RETICCTPCT" in the last 72 hours.  Sepsis Labs: Recent Labs  Lab 10/31/23 0009 10/31/23 0127 10/31/23 0137 10/31/23 0154 10/31/23 0356  PROCALCITON  --  <0.10  --   --   --   LATICACIDVEN 3.8*  --  >15.0* 0.8 1.1    Recent Results (from the past 240 hour(s))  Culture, blood (Routine x 2)     Status: None (Preliminary result)   Collection Time: 10/30/23 11:34 PM   Specimen: BLOOD LEFT ARM  Result Value Ref Range Status   Specimen Description BLOOD LEFT ARM  Final  Special Requests   Final    BOTTLES DRAWN AEROBIC AND ANAEROBIC Blood Culture adequate volume   Culture   Final    NO GROWTH 4 DAYS Performed at [facility], [address], [place], [place] [address]    Report Status PENDING  Incomplete  Culture, blood (Routine x 2)     Status: None (Preliminary result)   Collection Time: 10/30/23 11:34 PM   Specimen: BLOOD RIGHT ARM  Result Value Ref Range Status   Specimen Description BLOOD RIGHT ARM  Final   Special Requests   Final    BOTTLES DRAWN AEROBIC AND ANAEROBIC Blood Culture adequate volume   Culture   Final    NO GROWTH 4 DAYS Performed at [facility], [address] [place], [place] [address]    Report Status PENDING  Incomplete  Urine Culture     Status: None   Collection Time: 10/31/23  2:56 AM   Specimen: Urine, Catheterized  Result Value Ref Range Status   Specimen Description URINE, CATHETERIZED  Final   Special Requests NONE  Final   Culture   Final    NO GROWTH Performed at [facility], [address] [place], [place] [address]     Report Status 11/01/2023 FINAL  Final  MRSA Next Gen by PCR, Nasal     Status: None   Collection Time: 10/31/23  3:56 AM   Specimen: Nasal Mucosa; Nasal Swab  Result Value Ref Range Status   MRSA by PCR Next Gen NOT DETECTED NOT DETECTED Final    Comment: (NOTE) The GeneXpert MRSA Assay (FDA approved for NASAL specimens only), is one component of a comprehensive MRSA colonization surveillance program. It is not intended to diagnose MRSA infection nor to guide or monitor treatment for MRSA infections. Test performance is not FDA approved in patients less than 22 years old. Performed at [facility], [address], [place], [place] [address]          Radiology Studies: No results found.  Scheduled Meds:  Chlorhexidine Gluconate Cloth  6 each Topical Daily   enoxaparin (LOVENOX) injection  60 mg Subcutaneous Q24H   folic acid  1 mg Per Tube Daily   Gerhardt's butt cream   Topical TID   influenza vac split trivalent PF  0.5 mL Intramuscular Tomorrow-1000   insulin aspart  0-9 Units Subcutaneous Q4H   lactulose  20 g Per Tube TID   multivitamin with minerals  1 tablet Per Tube Daily   ofloxacin  2 drop Both Eyes QID   phenobarbital  32.4 mg Per Tube Q8H   potassium chloride  40 mEq Per Tube Q4H   tamsulosin  0.4 mg Oral Daily   thiamine  100 mg Per Tube Daily   Continuous Infusions:  lactated ringers 1,000 mL with potassium chloride 40 mEq infusion 50 mL/hr at 11/03/23 1449   potassium chloride 10 mEq (11/03/23 1451)      LOS: 3 days    Huey Bienenstock, MD [facility]  If 7PM-7AM, please contact night-coverage [email or web address]  11/03/2023, 3:23 PM

## 2023-11-03 NOTE — Progress Notes (Signed)
Received a call from bedside RN regarding the patient having profuse diarrhea.  Presented at bedside the patient is being cleaned from his diarrhea.  No acute distress.  Heart rate in the mid 100.    Flexi-Seal, entering precautions, and GI panel by PCR ordered.  Time: 15 minutes.

## 2023-11-03 NOTE — Progress Notes (Signed)
Physical Therapy Treatment Patient Details Name: Brian [name] MRN: [id number] DOB: [birth year] Today's Date: 11/03/2023   History of Present Illness 4M with cirrhosis, alcohol dependence (drinks two 1/5ths of liquor weekly) and severe bilateral lower extremity lymphedema who was admitted for AMS after he was found down at home. Initial CT head is negative. Spot EEG shows diffuse encephalopathy. PMHx: Alcoholic, pancreatitis, HTN, morbid obesity    PT Comments  Patient able to finally mobilize OOB despite continued leaking of the flexiseal.  Patient with trophic changes in legs consistent with more chronic lymphedema, though pt reports only had it for about 6 months.  He may benefit from lymphedema management, though given plans for d/c to inpatient rehab initially may need to address once able to find outpatient follow up.  PT will continue to follow in the acute setting.  Given he has help at home may be able to tolerate intensity of acute inpatient rehab prior to d/c home.    If plan is discharge home, recommend the following: Two people to help with walking and/or transfers;Two people to help with bathing/dressing/bathroom;Assist for transportation;Help with stairs or ramp for entrance;Assistance with cooking/housework   Can travel by private vehicle     No  Equipment Recommendations  None recommended by PT    Recommendations for Other Services Rehab consult     Precautions / Restrictions Precautions Precautions: Fall Precaution Comments: NG tube and flexiseal; watch HR     Mobility  Bed Mobility Overal bed mobility: Needs Assistance Bed Mobility: Rolling Rolling: Mod assist   Supine to sit: Mod assist     General bed mobility comments: assist to guide legs to EOB and pt pulling up to sit; assist for safety as slipping on mattress toward EOB    Transfers   Equipment used: Rolling walker (2 wheels) Transfers: Sit to/from Stand, Bed to chair/wheelchair/BSC Sit to  Stand: +2 physical assistance, Max assist           General transfer comment: stood to RW x 3 though unable to maintain standing so placed Stedy and pt able to use for transition to recliner    Ambulation/Gait               General Gait Details: unable   Stairs             Wheelchair Mobility     Tilt Bed    Modified Rankin (Stroke Patients Only)       Balance Overall balance assessment: Needs assistance Sitting-balance support: Feet supported Sitting balance-Leahy Scale: Good     Standing balance support: Bilateral upper extremity supported Standing balance-Leahy Scale: Zero Standing balance comment: UE support and +2 for safety with limited standing tolerance                            Cognition Arousal: Alert Behavior During Therapy: WFL for tasks assessed/performed Overall Cognitive Status: Impaired/Different from baseline Area of Impairment: Attention, Safety/judgement, Awareness, Problem solving                   Current Attention Level: Selective     Safety/Judgement: Decreased awareness of safety, Decreased awareness of deficits Awareness: Emergent Problem Solving: Slow processing, Decreased initiation, Requires verbal cues          Exercises General Exercises - Lower Extremity Ankle Circles/Pumps: AROM, 5 reps, Both, Supine Heel Slides: AAROM, 5 reps, Both, Supine    General Comments General comments (skin integrity,  edema, etc.): HR to 130's with mobility, SpO2 WNL on RA; assisted for hygiene initially as flexiseal leaking and likely periwick leaking; RN aware      Pertinent Vitals/Pain Pain Assessment Pain Assessment: No/denies pain    Home Living                          Prior Function            PT Goals (current goals can now be found in the care plan section) Progress towards PT goals: Progressing toward goals    Frequency    Min 1X/week      PT Plan      Co-evaluation               AM-PAC PT "6 Clicks" Mobility   Outcome Measure  Help needed turning from your back to your side while in a flat bed without using bedrails?: A Little Help needed moving from lying on your back to sitting on the side of a flat bed without using bedrails?: A Lot Help needed moving to and from a bed to a chair (including a wheelchair)?: Total Help needed standing up from a chair using your arms (e.g., wheelchair or bedside chair)?: Total Help needed to walk in hospital room?: Total Help needed climbing 3-5 steps with a railing? : Total 6 Click Score: 9    End of Session Equipment Utilized During Treatment: Gait belt Activity Tolerance: Patient limited by fatigue Patient left: in chair;with chair alarm set Nurse Communication: Mobility status;Need for lift equipment PT Visit Diagnosis: Other abnormalities of gait and mobility (R26.89);Muscle weakness (generalized) (M62.81);Other symptoms and signs involving the nervous system (R29.898)     Time: 1050-1140 PT Time Calculation (min) (ACUTE ONLY): 50 min  Charges:    $Therapeutic Activity: 38-52 mins PT General Charges $$ ACUTE PT VISIT: 1 Visit                     Sheran Lawless, PT Acute Rehabilitation Services Office:[phone] 11/03/2023    Elray Mcgregor 11/03/2023, 12:14 PM

## 2023-11-04 LAB — BASIC METABOLIC PANEL
Anion gap: 6 (ref 5–15)
BUN: 7 mg/dL (ref 6–20)
CO2: 19 mmol/L — ABNORMAL LOW (ref 22–32)
Calcium: 8.8 mg/dL — ABNORMAL LOW (ref 8.9–10.3)
Chloride: 113 mmol/L — ABNORMAL HIGH (ref 98–111)
Creatinine, Ser: 0.53 mg/dL — ABNORMAL LOW (ref 0.61–1.24)
GFR, Estimated: >60 mL/min (ref >60)
Glucose, Bld: 116 mg/dL — ABNORMAL HIGH (ref 70–99)
Potassium: 2.7 mmol/L — CL (ref 3.5–5.1)
Sodium: 138 mmol/L (ref 135–145)

## 2023-11-04 LAB — CULTURE, BLOOD (ROUTINE X 2)
Culture: NO GROWTH
Culture: NO GROWTH
Special Requests: ADEQUATE
Special Requests: ADEQUATE

## 2023-11-04 LAB — GLUCOSE, CAPILLARY
Glucose-Capillary: 101 mg/dL — ABNORMAL HIGH (ref 70–99)
Glucose-Capillary: 102 mg/dL — ABNORMAL HIGH (ref 70–99)
Glucose-Capillary: 107 mg/dL — ABNORMAL HIGH (ref 70–99)
Glucose-Capillary: 120 mg/dL — ABNORMAL HIGH (ref 70–99)
Glucose-Capillary: 127 mg/dL — ABNORMAL HIGH (ref 70–99)
Glucose-Capillary: 133 mg/dL — ABNORMAL HIGH (ref 70–99)

## 2023-11-04 LAB — CBC
HCT: 34.9 % — ABNORMAL LOW (ref 39.0–52.0)
Hemoglobin: 11.9 g/dL — ABNORMAL LOW (ref 13.0–17.0)
MCH: 33.6 pg (ref 26.0–34.0)
MCHC: 34.1 g/dL (ref 30.0–36.0)
MCV: 98.6 fL (ref 80.0–100.0)
Platelets: 127 10*3/uL — ABNORMAL LOW (ref 150–400)
RBC: 3.54 MIL/uL — ABNORMAL LOW (ref 4.22–5.81)
RDW: 14 % (ref 11.5–15.5)
WBC: 6.1 10*3/uL (ref 4.0–10.5)
nRBC: 0 % (ref 0.0–0.2)

## 2023-11-04 LAB — PHOSPHORUS: Phosphorus: 2.3 mg/dL — ABNORMAL LOW (ref 2.5–4.6)

## 2023-11-04 LAB — MAGNESIUM: Magnesium: 1.8 mg/dL (ref 1.7–2.4)

## 2023-11-04 MED ORDER — POLYETHYLENE GLYCOL 3350 17 G PO PACK: 17.0000 g | PACK | Freq: Every day | ORAL | Status: DC | PRN

## 2023-11-04 MED ORDER — K PHOS MONO-SOD PHOS DI & MONO 155-852-130 MG PO TABS
500.0000 mg | ORAL_TABLET | Freq: Four times a day (QID) | ORAL | Status: AC
  Administered 2023-11-04 – 2023-11-05 (×5): 500 mg via ORAL
  Filled 2023-11-04 (×5): qty 2

## 2023-11-04 MED ORDER — THIAMINE MONONITRATE 100 MG PO TABS
100.0000 mg | ORAL_TABLET | Freq: Every day | ORAL | Status: DC
  Administered 2023-11-05 – 2023-11-08 (×4): 100 mg via ORAL
  Filled 2023-11-04 (×4): qty 1

## 2023-11-04 MED ORDER — ADULT MULTIVITAMIN W/MINERALS CH
1.0000 | ORAL_TABLET | Freq: Every day | ORAL | Status: DC
  Administered 2023-11-05 – 2023-11-08 (×4): 1 via ORAL
  Filled 2023-11-04 (×4): qty 1

## 2023-11-04 MED ORDER — LACTULOSE 10 GM/15ML PO SOLN
20.0000 g | Freq: Three times a day (TID) | ORAL | Status: DC
  Filled 2023-11-04: qty 30

## 2023-11-04 MED ORDER — POTASSIUM CHLORIDE 20 MEQ PO PACK
40.0000 meq | PACK | ORAL | Status: AC
  Administered 2023-11-04 (×3): 40 meq
  Filled 2023-11-04 (×3): qty 2

## 2023-11-04 MED ORDER — FOLIC ACID 1 MG PO TABS
1.0000 mg | ORAL_TABLET | Freq: Every day | ORAL | Status: DC
  Administered 2023-11-05 – 2023-11-08 (×4): 1 mg via ORAL
  Filled 2023-11-04 (×4): qty 1

## 2023-11-04 MED ORDER — POTASSIUM CHLORIDE 10 MEQ/100ML IV SOLN
10.0000 meq | Freq: Once | INTRAVENOUS | Status: AC
  Administered 2023-11-04: 10 meq via INTRAVENOUS
  Filled 2023-11-04: qty 100

## 2023-11-04 MED ORDER — POTASSIUM PHOSPHATES 15 MMOLE/5ML IV SOLN
30.0000 mmol | Freq: Once | INTRAVENOUS | Status: AC
  Administered 2023-11-04: 30 mmol via INTRAVENOUS
  Filled 2023-11-04: qty 10

## 2023-11-04 NOTE — Plan of Care (Signed)
  Problem: Coping: Goal: Ability to adjust to condition or change in health will improve Outcome: Progressing   Problem: Health Behavior/Discharge Planning: Goal: Ability to manage health-related needs will improve Outcome: Progressing   Problem: Skin Integrity: Goal: Risk for impaired skin integrity will decrease Outcome: Progressing   Problem: Clinical Measurements: Goal: Ability to maintain clinical measurements within normal limits will improve Outcome: Progressing   Problem: Elimination: Goal: Will not experience complications related to bowel motility Outcome: Progressing

## 2023-11-04 NOTE — Progress Notes (Signed)
PROGRESS NOTE    Brian [name]  VQQ:[id number] DOB: 1961/11/05 DOA: 10/30/2023 PCP: Patient, No Pcp Per   Brief Narrative:   58M with cirrhosis, alcohol dependence (drinks two 1/5ths of liquor weekly) and severe bilateral lower extremity lymphedema who was admitted for AMS after he was found down at home. Initial CT head is negative. Spot EEG shows diffuse encephalopathy. Ammonia level elevateed at 43. Alcohol level undetectable. CT Abdomen with dilated bowel loops with fluid filled stool and some thickening of the walls concerning for colitis. There is concern for wall thickening of the [place]. General Surgery consulted with low suspicion for appendicitis.    Assessment & Plan:   Active Problems:   Hepatic encephalopathy (HCC)   Acute encephalopathy, hepatic with possible metabolic component Concurrent lactic acidosis, poa -Elevated ammonia improving with lactulose  -Lactic acidosis likely related to above -no signs or symptoms of infection -CT head, EEG without overt abnormality -NG tube in place for medication administration, on full liquid diet after SLP evaluation -MRI without overt acute or reversible findings -Patient much improved, will DC his NG tube, and advance his diet   Alcohol Use Disorder w/ hx of withdrawal w/o seizures  -phenobarbital taper ongoing -Likely playing a role in patient's mental status as above -thiamine, folate, multivitamin per protocol -Continue to follow CIWA protocol   Colitis vs Ileitis -acute appendicitis ruled out -General Surgery consulted at intake, imaging reviewed not acute appendicitis given patient's lack of abdominal findings and clinical status. -Appendicolith noted on CT -NG tube initially placed for decompression   HTN -Currently holding medications, resume once appropriate   Lymphedema -Reportedly chronic, follow up outpatient for further evaluation workup   Possible OSA -Recommend outpatient sleep study   Severe  hypokalemia -Significantly low this morning as well despite aggressive replacement yesterday, continue to replace   Moderate to severe protein caloric malnutrition, POA -Will start on supplements -Will consult nutritionist  Thrombocytopenia -secondary to alcohol, improving  Bilateral  conjunctivitis -on ofloxacin eyedrops  Hypokalemia -Potassium remains significantly low today at 2.8, repleted both IV and p.o.  Hyperammonemia -Improving after increasing his lactulose dose  Prediabetes -Patient denies history of glucose intolerance, A1c 5.8 -Recommend improved diet and exercise  Skin excoriation -Continue wound care    DVT prophylaxis: SCDs Start: 10/31/23 0326, Lovenox Code Status:   Code Status: Full Code Family Communication: None present  Status is: Inpatient  Dispo: The patient is from: Home              Anticipated d/c is to: Home              Anticipated d/c date is: 24 to 48 hours              Patient currently not medically stable for discharge  Consultants:  PCCM  Procedures:  None  Antimicrobials:  None indicated  Subjective: No significant events overnight, denies any complaints today. Objective: Vitals:   11/04/23 0000 11/04/23 0400 11/04/23 0500 11/04/23 0800  BP: (!) 148/72 112/74  127/72  Pulse: (!) 105 (!) 101  100  Resp: 17 19  20   Temp: 98 F (36.7 C) 98.2 F (36.8 C)  98 F (36.7 C)  TempSrc: Oral Oral  Oral  SpO2:  96%    Weight:   123 kg   Height:        Intake/Output Summary (Last 24 hours) at 11/04/2023 1332 Last data filed at 11/04/2023 0600 Gross per 24 hour  Intake --  Output 1050  ml  Net -1050 ml   Filed Weights   11/01/23 0500 11/02/23 0330 11/04/23 0500  Weight: 123.6 kg 124.9 kg 123 kg    Examination:    Awake Alert, he is talkative, appropriate, extremely frail and deconditioned HEENT with bilateral conjunctivitis Symmetrical Chest wall movement, Good air movement bilaterally, CTAB RRR,No Gallops,Rubs  or new Murmurs, No Parasternal Heave +ve B.Sounds, Abd Soft, No tenderness, No rebound - guarding or rigidity. No Cyanosis, he has chronic lower extremity skin changes, please see pictures below  Awake Alert, Oriented X 3, appropriate, conversant Junk to Vitas much improved Symmetrical Chest wall movement, Good air movement bilaterally, CTAB RRR,No Gallops,Rubs or new Murmurs, No Parasternal Heave +ve B.Sounds, Abd Soft, No tenderness, No rebound - guarding or rigidity. No Cyanosis, Clubbing, chronic lower extremity skin changes present, edema much subsided     Data Reviewed: I have personally reviewed following labs and imaging studies  CBC: Recent Labs  Lab 10/30/23 2336 10/31/23 0009 11/01/23 0306 11/02/23 1058 11/03/23 0347 11/04/23 0354  WBC 6.9  --  5.4 5.3 5.0 6.1  NEUTROABS 5.4  --   --   --   --   --   HGB 15.3 16.0 14.0 12.3* 12.3* 11.9*  HCT 45.0 47.0 41.7 36.2* 35.9* 34.9*  MCV 100.7*  --  100.5* 99.7 98.6 98.6  PLT 162  --  87* 122* 126* 127*   Basic Metabolic Panel: Recent Labs  Lab 10/31/23 1425 11/01/23 0306 11/02/23 1058 11/03/23 0347 11/03/23 1403 11/04/23 0354  NA 140 141 137 138 135 138  K 3.3* 3.0* 2.2* 2.3* 2.8* 2.7*  CL 106 107 107 110 108 113*  CO2 21* 23 19* 19* 18* 19*  GLUCOSE 120* 112* 123* 117* 136* 116*  BUN 19 15 12 8 9 7   CREATININE 0.87 0.76 0.56* 0.48* 0.59* 0.53*  CALCIUM 9.2 9.3 8.9 8.7* 8.5* 8.8*  MG 2.3  --  1.8 1.7  --  1.8  PHOS  --   --  3.2 3.1  --  2.3*   GFR: Estimated Creatinine Clearance: 133.1 mL/min (A) (by C-G formula based on SCr of 0.53 mg/dL (L)). Liver Function Tests: Recent Labs  Lab 10/30/23 2336 10/31/23 1425 11/01/23 0306  AST 44* 39 40  ALT 25 22 25   ALKPHOS 75 66 65  BILITOT 9.4* 9.3* 8.8*  PROT 8.7* 8.0 8.0  ALBUMIN 2.8* 2.6* 2.7*   Recent Labs  Lab 10/30/23 2336  LIPASE 35   Recent Labs  Lab 10/31/23 0127 11/01/23 0306 11/02/23 1313  AMMONIA 42* 72* 43*   Coagulation  Profile: Recent Labs  Lab 10/30/23 2336  INR 1.4*   Cardiac Enzymes: No results for input(s): "CKTOTAL", "CKMB", "CKMBINDEX", "TROPONINI" in the last 168 hours. BNP (last 3 results) No results for input(s): "PROBNP" in the last 8760 hours. HbA1C: No results for input(s): "HGBA1C" in the last 72 hours.  CBG: Recent Labs  Lab 11/03/23 2110 11/04/23 0031 11/04/23 0445 11/04/23 0753 11/04/23 1151  GLUCAP 117* 101* 102* 107* 120*   Lipid Profile: No results for input(s): "CHOL", "HDL", "LDLCALC", "TRIG", "CHOLHDL", "LDLDIRECT" in the last 72 hours. Thyroid Function Tests: No results for input(s): "TSH", "T4TOTAL", "FREET4", "T3FREE", "THYROIDAB" in the last 72 hours.  Anemia Panel: No results for input(s): "VITAMINB12", "FOLATE", "FERRITIN", "TIBC", "IRON", "RETICCTPCT" in the last 72 hours.  Sepsis Labs: Recent Labs  Lab 10/31/23 0009 10/31/23 0127 10/31/23 0137 10/31/23 0154 10/31/23 0356  PROCALCITON  --  <0.10  --   --   --  LATICACIDVEN 3.8*  --  >15.0* 0.8 1.1    Recent Results (from the past 240 hour(s))  Culture, blood (Routine x 2)     Status: None   Collection Time: 10/30/23 11:34 PM   Specimen: BLOOD LEFT ARM  Result Value Ref Range Status   Specimen Description BLOOD LEFT ARM  Final   Special Requests   Final    BOTTLES DRAWN AEROBIC AND ANAEROBIC Blood Culture adequate volume   Culture   Final    NO GROWTH 5 DAYS Performed at [facility], [address] [place], [place] [address]    Report Status 11/04/2023 FINAL  Final  Culture, blood (Routine x 2)     Status: None   Collection Time: 10/30/23 11:34 PM   Specimen: BLOOD RIGHT ARM  Result Value Ref Range Status   Specimen Description BLOOD RIGHT ARM  Final   Special Requests   Final    BOTTLES DRAWN AEROBIC AND ANAEROBIC Blood Culture adequate volume   Culture   Final    NO GROWTH 5 DAYS Performed at [facility], [address], [place], [place] [address]    Report Status  11/04/2023 FINAL  Final  Urine Culture     Status: None   Collection Time: 10/31/23  2:56 AM   Specimen: Urine, Catheterized  Result Value Ref Range Status   Specimen Description URINE, CATHETERIZED  Final   Special Requests NONE  Final   Culture   Final    NO GROWTH Performed at [facility], [address] [place], [place] [address]    Report Status 11/01/2023 FINAL  Final  MRSA Next Gen by PCR, Nasal     Status: None   Collection Time: 10/31/23  3:56 AM   Specimen: Nasal Mucosa; Nasal Swab  Result Value Ref Range Status   MRSA by PCR Next Gen NOT DETECTED NOT DETECTED Final    Comment: (NOTE) The GeneXpert MRSA Assay (FDA approved for NASAL specimens only), is one component of a comprehensive MRSA colonization surveillance program. It is not intended to diagnose MRSA infection nor to guide or monitor treatment for MRSA infections. Test performance is not FDA approved in patients less than 75 years old. Performed at [facility], [address], [place], [place] [address]          Radiology Studies: No results found.  Scheduled Meds:  Chlorhexidine Gluconate Cloth  6 each Topical Daily   enoxaparin (LOVENOX) injection  60 mg Subcutaneous Q24H   folic acid  1 mg Per Tube Daily   Gerhardt's butt cream   Topical TID   influenza vac split trivalent PF  0.5 mL Intramuscular Tomorrow-1000   insulin aspart  0-9 Units Subcutaneous Q4H   lactulose  20 g Per Tube TID   multivitamin with minerals  1 tablet Per Tube Daily   ofloxacin  2 drop Both Eyes QID   phosphorus  500 mg Oral QID   potassium chloride  40 mEq Per Tube Q4H   tamsulosin  0.4 mg Oral Daily   thiamine  100 mg Per Tube Daily   Continuous Infusions:      LOS: 4 days    Huey Bienenstock, MD [facility]  If 7PM-7AM, please contact night-coverage [email or web address]  11/04/2023, 1:32 PM

## 2023-11-04 NOTE — Plan of Care (Signed)

## 2023-11-04 NOTE — Progress Notes (Signed)
Inpatient Rehab Admissions:  Inpatient Rehab Consult received.  I met with patient at the bedside for rehabilitation assessment and to discuss goals and expectations of an inpatient rehab admission.  Discussed average length of stay, cost of CIR for an uninsured pt, and discharge home after completion of CIR. Pt acknowledged  understanding. Pt interested in pursuing CIR. Pt gave permission to contact wife. Called pt's wife Kimberely. No one answered. Left a message; awaiting return call. Will continue to follow.  Signed: Wolfgang Phoenix, MS, CCC-SLP Admissions Coordinator [phone]

## 2023-11-04 NOTE — PMR Pre-admission (Signed)
PMR Admission Coordinator Pre-Admission Assessment  Patient: Brian [name] is an 62 y.o., male MRN: [id number] DOB: [birth year] Height: 6' (182.9 cm) Weight: 124 kg  Insurance Information HMO:     PPO:      PCP:      IPA:      80/20:      OTHER:  PRIMARY: UNINSURED  Gave pt the Self Pay Estimate on 11/06/23    Policy#:       Subscriber:  CM Name:       Phone#:      Fax#:  Pre-Cert#:       Employer:  Benefits:  Phone #:      Name:  Eff. Date:      Deduct:       Out of Pocket Max:       Life Max:  CIR:       SNF:  Outpatient:      Co-Pay:  Home Health:       Co-Pay:  DME:      Co-Pay:  Providers:  SECONDARY:       Policy#:      Phone#:   Financial Counselor: Livingston Diones       Phone#: [phone]  The "Data Collection Information Summary" for patients in Inpatient Rehabilitation Facilities with attached "Privacy Act Statement-Health Care Records" was provided and verbally reviewed with: {CHL IP Patient Family MV:[id number]}  Emergency Contact Information Contact Information     Name Relation Home Work Mobile   Ford,Kimberely Spouse   [phone]      Other Contacts   None on File     Current Medical History  Patient Admitting Diagnosis: debility History of Present Illness: Pt is a 62 year old male with medical hx significant for: pancreatitis, cholelithiasis, fatty liver, lymphedema, HTN, acoholic ketoacidosis, thyroid nodule.  Pt presented to [facility] on 10/30/23 d/t decreased responsiveness. Pt found sitting in urine and feces. Pt was tachycardic and tachypneic. Pt's oxygen saturation was 87%. CT negative for acute abnormalities. EEG on 11/12 showed diffuse encephalopathy. NO seizures seen. CT abdomen revealed dilated bowel loops with fluid filled stool and thickening of walls concerning for colitis. Also concern for wall thickening of appendix. Surgery consulted d/t concern for appendicitis. Determined low suspicion for appendicitis and recommended non  operative management. MRI negative for acute abnormalities. Therapy evaluations completed and CIR recommended d/t pt's deficits in functional mobility.    Patient's medical record from [facility] has been reviewed by the rehabilitation admission coordinator and physician.  Past Medical History  Past Medical History:  Diagnosis Date   Alcoholic pancreatitis    ETOH abuse    HTN (hypertension)    Hypertension    Morbid obesity (HCC)     Has the patient had major surgery during 100 days prior to admission? No  Family History   family history is not on file.  Current Medications  Current Facility-Administered Medications:    carvedilol (COREG) tablet 12.5 mg, 12.5 mg, Oral, BID WC, Elgergawy, Leana Roe, MD   Chlorhexidine Gluconate Cloth 2 % PADS 6 each, 6 each, Topical, Daily, Albustami, Flonnie Hailstone, MD, 6 each at 11/06/23 0272   diltiazem (CARDIZEM) injection 10 mg, 10 mg, Intravenous, Once, Elgergawy, Leana Roe, MD   enoxaparin (LOVENOX) injection 60 mg, 60 mg, Subcutaneous, Q24H, Calton Dach I, RPH, 60 mg at 11/06/23 5366   feeding supplement (ENSURE ENLIVE / ENSURE PLUS) liquid 237 mL, 237 mL, Oral, BID BM, Elgergawy, Leana Roe, MD,  237 mL at 11/06/23 1212   folic acid (FOLVITE) tablet 1 mg, 1 mg, Oral, Daily, Elgergawy, Leana Roe, MD, 1 mg at 11/06/23 0810   Gerhardt's butt cream, , Topical, TID, Elgergawy, Leana Roe, MD, Given at 11/06/23 [phone]   influenza vac split trivalent PF (FLULAVAL) injection 0.5 mL, 0.5 mL, Intramuscular, Tomorrow-1000, Azucena Fallen, MD   insulin aspart (novoLOG) injection 0-9 Units, 0-9 Units, Subcutaneous, Q4H, Albustami, Flonnie Hailstone, MD, 1 Units at 11/06/23 1212   ipratropium-albuterol (DUONEB) 0.5-2.5 (3) MG/3ML nebulizer solution 3 mL, 3 mL, Nebulization, Q6H PRN, Albustami, Flonnie Hailstone, MD   lactulose (CHRONULAC) 10 GM/15ML solution 10 g, 10 g, Oral, BID, Elgergawy, Leana Roe, MD, 10 g at 11/06/23 0931   loperamide (IMODIUM) capsule 2 mg, 2 mg,  Oral, Q6H PRN, Elgergawy, Leana Roe, MD, 2 mg at 11/06/23 0811   LORazepam (ATIVAN) injection 1 mg, 1 mg, Intravenous, Q4H PRN, Calton Dach I, RPH   magnesium sulfate IVPB 1 g 100 mL, 1 g, Intravenous, Once, Elgergawy, Leana Roe, MD   multivitamin with minerals tablet 1 tablet, 1 tablet, Oral, Daily, Elgergawy, Leana Roe, MD, 1 tablet at 11/06/23 0810   naphazoline-glycerin (CLEAR EYES REDNESS) ophth solution 1-2 drop, 1-2 drop, Both Eyes, QID PRN, Rocky Morel, DO   ofloxacin (OCUFLOX) 0.3 % ophthalmic solution 2 drop, 2 drop, Both Eyes, QID, Elgergawy, Leana Roe, MD, 2 drop at 11/06/23 1212   Oral care mouth rinse, 15 mL, Mouth Rinse, PRN, Albustami, Flonnie Hailstone, MD   polyethylene glycol (MIRALAX / GLYCOLAX) packet 17 g, 17 g, Oral, Daily PRN, Elgergawy, Leana Roe, MD   spironolactone (ALDACTONE) tablet 25 mg, 25 mg, Oral, Daily, Elgergawy, Leana Roe, MD, 25 mg at 11/06/23 1212   tamsulosin (FLOMAX) capsule 0.4 mg, 0.4 mg, Oral, Daily, Azucena Fallen, MD, 0.4 mg at 11/06/23 7253   thiamine (VITAMIN B1) tablet 100 mg, 100 mg, Oral, Daily, Elgergawy, Leana Roe, MD, 100 mg at 11/06/23 6644  Patients Current Diet:  Diet Order             DIET DYS 3 Room service appropriate? Yes; Fluid consistency: Thin  Diet effective now                   Precautions / Restrictions Precautions Precautions: Fall Precaution Comments: Watch HR. Bowel incontinence Restrictions Weight Bearing Restrictions: No   Has the patient had 2 or more falls or a fall with injury in the past year? Yes  Prior Activity Level Limited Community (1-2x/wk): doctors' appointments  Prior Functional Level Self Care: Did the patient need help bathing, dressing, using the toilet or eating? Independent  Indoor Mobility: Did the patient need assistance with walking from room to room (with or without device)? Independent  Stairs: Did the patient need assistance with internal or external stairs (with or without device)?  Dependent (stopped going up stairs at house)  Functional Cognition: Did the patient need help planning regular tasks such as shopping or remembering to take medications? Needed some help  Patient Information Are you of Hispanic, Latino/a,or Spanish origin?: A. No, not of Hispanic, Latino/a, or Spanish origin What is your race?: C. American [place] or [place] Native Do you need or want an interpreter to communicate with a doctor or health care staff?: 0. No  Patient's Response To:  Health Literacy and Transportation Is the patient able to respond to health literacy and transportation needs?: Yes Health Literacy - How often do you need to have someone help  you when you read instructions, pamphlets, or other written material from your doctor or pharmacy?: Never In the past 12 months, has lack of transportation kept you from medical appointments or from getting medications?: Yes In the past 12 months, has lack of transportation kept you from meetings, work, or from getting things needed for daily living?: Yes  Home Assistive Devices / Equipment Home Equipment: Agricultural consultant (2 wheels), Rollator (4 wheels), Cane - single point, Hand held shower head  Prior Device Use: Indicate devices/aids used by the patient prior to current illness, exacerbation or injury? Walker  Current Functional Level Cognition  Overall Cognitive Status: Within Functional Limits for tasks assessed Current Attention Level: Selective Orientation Level: Oriented X4 Safety/Judgement: Decreased awareness of safety, Decreased awareness of deficits General Comments: basic orientation was [facility], pt followed all simple 1 step commands. very limited insight into deficits and safety, although eager to participate    Extremity Assessment (includes Sensation/Coordination)  Upper Extremity Assessment: Generalized weakness  Lower Extremity Assessment: Defer to PT evaluation    ADLs  Overall ADL's : Needs  assistance/impaired Eating/Feeding: Independent Grooming: Set up, Sitting Upper Body Bathing: Set up, Sitting Lower Body Bathing: Maximal assistance, Sitting/lateral leans Upper Body Dressing : Set up, Sitting Lower Body Dressing: Total assistance Toilet Transfer: Total assistance Toilet Transfer Details (indicate cue type and reason): pt may benefit from stedy Toileting- Clothing Manipulation and Hygiene: Total assistance Toileting - Clothing Manipulation Details (indicate cue type and reason): flexi currently Functional mobility during ADLs: Maximal assistance, +2 for safety/equipment, +2 for physical assistance (bed level) General ADL Comments: limited by weakness, body habitus, unable to successfully stand EOB, good sitting balance    Mobility  Overal bed mobility: Needs Assistance Bed Mobility: Supine to Sit, Sit to Supine, Rolling Rolling: Min assist Supine to sit: Contact guard, Used rails, HOB elevated Sit to supine: Min assist, HOB elevated General bed mobility comments: Pt able to sit EOB on his own with no physical assistance and use of rails however needed assistance with BLE management upon returning to bed for pericare.    Transfers  Overall transfer level: Needs assistance Equipment used: Rolling walker (2 wheels) Transfers: Sit to/from Stand Sit to Stand: +2 physical assistance, Max assist Bed to/from chair/wheelchair/BSC transfer type:: Via Lift equipment General transfer comment: pt able to stand with bari RW with +2 Max A however unable to come fully upright and noted to have BM upon standing along with HR increasing to 156. RN made aware    Ambulation / Gait / Stairs / Wheelchair Mobility  Ambulation/Gait General Gait Details: unable    Posture / Balance Dynamic Sitting Balance Sitting balance - Comments: able to correct when balance is challenged Balance Overall balance assessment: Needs assistance Sitting-balance support: Feet supported Sitting  balance-Leahy Scale: Good Sitting balance - Comments: able to correct when balance is challenged Standing balance support: Bilateral upper extremity supported Standing balance-Leahy Scale: Poor Standing balance comment: Reliant on external support.    Special needs/care consideration Skin IAD: buttocks, groin, perineum, thighs/bilateral, posterior; Abrasion: buttocks, thigh/bilateral; Erythema/Redness: leg/bilateral; Scratch Marks: thigh/right, Bowel and Bladder incontinence, External Urinary Catheter and Diabetic management Novolog 0-9 units every 4 hours   Previous Home Environment (from acute therapy documentation) Living Arrangements: Spouse/significant other, Children  Lives With: Spouse, Son Available Help at Discharge: Family, Available 24 hours/day Type of Home: House Home Layout: 1/2 bath on main level, Two level, Other (Comment) (sleeping on couch or on recliner) Alternate Level Stairs-Rails: Can reach both Alternate  Level Stairs-Number of Steps: flight Home Access: Stairs to enter Entrance Stairs-Rails: None Entrance Stairs-Number of Steps: 1 Bathroom Shower/Tub: Sponge bathes at baseline [employer]: Standard Bathroom Accessibility: Yes How Accessible: Accessible via walker Home Care Services: No  Discharge Living Setting Plans for Discharge Living Setting: Patient's home Type of Home at Discharge: House Discharge Home Layout: 1/2 bath on main level, Two level, Other (Comment) (sleeping on couch or in recliner) Alternate Level Stairs-Rails: Can reach both Alternate Level Stairs-Number of Steps: flight Discharge Home Access: Stairs to enter Entrance Stairs-Rails: None Entrance Stairs-Number of Steps: 1 Discharge Bathroom Shower/Tub: Tub/shower unit, Other (comment) (taking sponge baths since wasn't going up stairs at house) Discharge Bathroom Toilet: Standard Discharge Bathroom Accessibility: Yes How Accessible: Accessible via walker Does the patient have any  problems obtaining your medications?: Yes (Describe) (expensive)  Social/Family/Support Systems Anticipated Caregiver: Irven Easterly, wife Anticipated Industrial/product designer Information: [phone] Discharge Plan Discussed with Primary Caregiver: Yes Is Caregiver In Agreement with Plan?: Yes Does Caregiver/Family have Issues with Lodging/Transportation while Pt is in Rehab?: No  Goals Patient/Family Goal for Rehab: Min A:PT/OT Expected length of stay: 14-16 days Pt/Family Agrees to Admission and willing to participate: Yes Program Orientation Provided & Reviewed with Pt/Caregiver Including Roles  & Responsibilities: Yes  Decrease burden of Care through IP rehab admission: NA  Possible need for SNF placement upon discharge: Not anticipated  Patient Condition: I have reviewed medical records from [facility], spoken with CM, and patient and spouse. I met with patient at the bedside and discussed via phone for inpatient rehabilitation assessment.  Patient will benefit from ongoing PT and OT, can actively participate in 3 hours of therapy a day 5 days of the week, and can make measurable gains during the admission.  Patient will also benefit from the coordinated team approach during an Inpatient Acute Rehabilitation admission.  The patient will receive intensive therapy as well as Rehabilitation physician, nursing, social worker, and care management interventions.  Due to bladder management, bowel management, safety, skin/wound care, disease management, medication administration, pain management, and patient education the patient requires 24 hour a day rehabilitation nursing.  The patient is currently *** with mobility and basic ADLs.  Discharge setting and therapy post discharge at home with home health is anticipated.  Patient has agreed to participate in the Acute Inpatient Rehabilitation Program and will admit {Time; today/tomorrow:10263}.  Preadmission Screen Completed By:  Domingo Pulse, 11/06/2023 4:08 PM ______________________________________________________________________   Discussed status with Dr. Marland Kitchen on *** at *** and received approval for admission today.  Admission Coordinator:  Domingo Pulse, CCC-SLP, time ***/Date ***   Assessment/Plan: Diagnosis: Does the need for close, 24 hr/day Medical supervision in concert with the patient's rehab needs make it unreasonable for this patient to be served in a less intensive setting? {yes_no_potentially:3041433} Co-Morbidities requiring supervision/potential complications: *** Due to {due UJ:8119147}, does the patient require 24 hr/day rehab nursing? {yes_no_potentially:3041433} Does the patient require coordinated care of a physician, rehab nurse, PT, OT, and SLP to address physical and functional deficits in the context of the above medical diagnosis(es)? {yes_no_potentially:3041433} Addressing deficits in the following areas: {deficits:3041436} Can the patient actively participate in an intensive therapy program of at least 3 hrs of therapy 5 days a week? {yes_no_potentially:3041433} The potential for patient to make measurable gains while on inpatient rehab is {potential:3041437} Anticipated functional outcomes upon discharge from inpatient rehab: {functional outcomes:304600100} PT, {functional outcomes:304600100} OT, {functional outcomes:304600100} SLP Estimated rehab length of stay  to reach the above functional goals is: *** Anticipated discharge destination: {anticipated dc setting:21604} 10. Overall Rehab/Functional Prognosis: {potential:3041437}   MD Signature: ***

## 2023-11-05 LAB — GASTROINTESTINAL PANEL BY PCR, STOOL (REPLACES STOOL CULTURE)

## 2023-11-05 LAB — BASIC METABOLIC PANEL
Anion gap: 5 (ref 5–15)
BUN: <5 mg/dL — ABNORMAL LOW (ref 6–20)
CO2: 19 mmol/L — ABNORMAL LOW (ref 22–32)
Calcium: 8.6 mg/dL — ABNORMAL LOW (ref 8.9–10.3)
Chloride: 114 mmol/L — ABNORMAL HIGH (ref 98–111)
Creatinine, Ser: 0.51 mg/dL — ABNORMAL LOW (ref 0.61–1.24)
GFR, Estimated: >60 mL/min (ref >60)
Glucose, Bld: 103 mg/dL — ABNORMAL HIGH (ref 70–99)
Potassium: 2.9 mmol/L — ABNORMAL LOW (ref 3.5–5.1)
Sodium: 138 mmol/L (ref 135–145)

## 2023-11-05 LAB — CBC
HCT: 35.3 % — ABNORMAL LOW (ref 39.0–52.0)
Hemoglobin: 12.2 g/dL — ABNORMAL LOW (ref 13.0–17.0)
MCH: 33.7 pg (ref 26.0–34.0)
MCHC: 34.6 g/dL (ref 30.0–36.0)
MCV: 97.5 fL (ref 80.0–100.0)
Platelets: 119 10*3/uL — ABNORMAL LOW (ref 150–400)
RBC: 3.62 MIL/uL — ABNORMAL LOW (ref 4.22–5.81)
RDW: 14.2 % (ref 11.5–15.5)
WBC: 6.1 10*3/uL (ref 4.0–10.5)
nRBC: 0 % (ref 0.0–0.2)

## 2023-11-05 LAB — GLUCOSE, CAPILLARY
Glucose-Capillary: 101 mg/dL — ABNORMAL HIGH (ref 70–99)
Glucose-Capillary: 110 mg/dL — ABNORMAL HIGH (ref 70–99)
Glucose-Capillary: 117 mg/dL — ABNORMAL HIGH (ref 70–99)
Glucose-Capillary: 123 mg/dL — ABNORMAL HIGH (ref 70–99)
Glucose-Capillary: 127 mg/dL — ABNORMAL HIGH (ref 70–99)
Glucose-Capillary: 133 mg/dL — ABNORMAL HIGH (ref 70–99)
Glucose-Capillary: 141 mg/dL — ABNORMAL HIGH (ref 70–99)

## 2023-11-05 LAB — MAGNESIUM: Magnesium: 1.7 mg/dL (ref 1.7–2.4)

## 2023-11-05 LAB — PHOSPHORUS: Phosphorus: 3.3 mg/dL (ref 2.5–4.6)

## 2023-11-05 MED ORDER — LOPERAMIDE HCL 2 MG PO CAPS
2.0000 mg | ORAL_CAPSULE | Freq: Four times a day (QID) | ORAL | Status: DC | PRN
  Administered 2023-11-05 – 2023-11-08 (×5): 2 mg via ORAL
  Filled 2023-11-05 (×4): qty 1

## 2023-11-05 MED ORDER — MAGNESIUM SULFATE 2 GM/50ML IV SOLN
2.0000 g | Freq: Once | INTRAVENOUS | Status: AC
  Administered 2023-11-05: 2 g via INTRAVENOUS
  Filled 2023-11-05: qty 50

## 2023-11-05 MED ORDER — LOPERAMIDE HCL 2 MG PO CAPS: 2.0000 mg | ORAL_CAPSULE | Freq: Four times a day (QID) | ORAL | Status: DC | PRN

## 2023-11-05 MED ORDER — LACTULOSE 10 GM/15ML PO SOLN
10.0000 g | Freq: Two times a day (BID) | ORAL | Status: DC
  Administered 2023-11-05 – 2023-11-06 (×2): 10 g via ORAL
  Filled 2023-11-05 (×3): qty 30

## 2023-11-05 MED ORDER — ENSURE ENLIVE PO LIQD
237.0000 mL | Freq: Two times a day (BID) | ORAL | Status: DC
  Administered 2023-11-05 – 2023-11-08 (×8): 237 mL via ORAL

## 2023-11-05 MED ORDER — POTASSIUM CHLORIDE CRYS ER 20 MEQ PO TBCR
40.0000 meq | EXTENDED_RELEASE_TABLET | ORAL | Status: AC
  Administered 2023-11-05 (×3): 40 meq via ORAL
  Filled 2023-11-05 (×3): qty 2

## 2023-11-05 NOTE — Progress Notes (Addendum)
Inpatient Rehab Admissions Coordinator:  Attempted to contact pt's wife Kimberely to verify dispo. No one answered. Left a message; awaiting return call.  1236: Wife returned call. Explained CIR goals and expectations. Discussed average length of stay, cost of CIR for an uninsured pt and discharge home after completion of CIR. She acknowledged understanding. She is supportive of pt pursuing CIR. She confirmed that she will be able to provide 24/7 support for pt after discharge.    Wolfgang Phoenix, MS, CCC-SLP Admissions Coordinator [phone]

## 2023-11-05 NOTE — Plan of Care (Signed)
  Problem: Coping: Goal: Ability to adjust to condition or change in health will improve Outcome: Progressing   Problem: Skin Integrity: Goal: Risk for impaired skin integrity will decrease Outcome: Progressing   Problem: Health Behavior/Discharge Planning: Goal: Ability to manage health-related needs will improve Outcome: Progressing   Problem: Clinical Measurements: Goal: Ability to maintain clinical measurements within normal limits will improve Outcome: Progressing   Problem: Activity: Goal: Risk for activity intolerance will decrease Outcome: Progressing   Problem: Elimination: Goal: Will not experience complications related to bowel motility Outcome: Progressing   Problem: Safety: Goal: Ability to remain free from injury will improve Outcome: Progressing

## 2023-11-05 NOTE — Progress Notes (Signed)
Initial Nutrition Assessment  DOCUMENTATION CODES:   Obesity unspecified  INTERVENTION:  Continue to advance diet as medically applicable and tolerated Ensure Plus High Protein po BID, each supplement provides 350 kcal and 20 grams of protein. Daily MVI   NUTRITION DIAGNOSIS:   Increased nutrient needs related to chronic illness as evidenced by estimated needs.    GOAL:   Patient will meet greater than or equal to 90% of their needs    MONITOR:   PO intake, Supplement acceptance, Weight trends, Diet advancement, Labs  REASON FOR ASSESSMENT:   Consult Assessment of nutrition requirement/status  ASSESSMENT:    53 y.yo M, admitted for AMS after found down at home. PMH; cirrhosis, alcohol dependence (drinks two 1/5ths of liquor weekly) and severe bilateral lower extremity lymphedema, Morbid obesity, HTN, alcoholic pancreatitis. Review of EMR revealed; Spot EEG showed diffuse encephalopathy. Elevated  Ammonia  at 43. CT Abdomen with dilated bowel loops with fluid filled stool and some thickening of the walls concerning for colitis and concern for wall thickening of the [place]. General Surgery consulted. Moderate edema  BLE.  Team reached out to. Patient appears to be tolerating diet advancement. RD attempted contact Via phone with no answer.   11/12- NG Vented/dual lumen R nare; currently clamped. 11/13-  Diet; Advanced to full liquids 11/16-  Diet ; Advanced to DYS 3  Admit weight: 130 kg Current weight: 124 kg  Hospital weight history: 11/05/23 0500 124 kg 273.37 lbs  11/04/23 0500 123 kg 271.17 lbs  11/02/23 0330 124.9 kg 275.35 lbs  11/01/23 0500 123.6 kg 272.49 lbs  10/31/23 0500 123.6 kg 272.49 lbs  10/31/23 0030 130 kg 286.6 lbs     Average Meal Intake: No current documentation  Nutritionally Relevant Medications: Scheduled Meds:  folic acid  1 mg Oral Daily   lactulose  10 g Oral BID   multivitamin with minerals  1 tablet Oral Daily   phosphorus  500  mg Oral QID   potassium chloride  40 mEq Oral Q4H   thiamine  100 mg Oral Daily    Continuous Infusions: PRN Meds:.ipratropium-albuterol, LORazepam, naphazoline-glycerin, mouth rinse, polyethylene glycol  Labs Reviewed: Component Ref Range & Units 02:50 (11/05/23) 1 d ago (11/04/23) 2 d ago (11/03/23) 2 d ago (11/03/23) 3 d ago (11/02/23) 4 d ago (11/01/23) 5 d ago (10/31/23)  Potassium 3.5 - 5.1 mmol/L 2.9 Low  2.7 Low Panic  CM 2.8 Low  2.3 Low Panic  CM 2.2 Low Panic  CM 3.0 Low  3.3 Low   Chloride 98 - 111 mmol/L 114 High  113 High  108 110 107 107 106  CO2 22 - 32 mmol/L 19 Low  19 Low  18 Low  19 Low  19 Low  23 21 Low   Glucose, Bld 70 - 99 mg/dL 161 High  096 High  CM 136 High  CM 117 High  CM 123 High  CM 112 High  CM 120 High  CM  BUN 6 - 20 mg/dL <5 Low  7 9 8 12 15 19   Creatinine, Ser 0.61 - 1.24 mg/dL 0.45 Low  4.09 Low  8.11 Low  0.48 Low  0.56 Low  0.76 0.87  Calcium 8.9 - 10.3 mg/dL 8.6 Low  8.8 Low  8.5 Low  8.7 Low  8.9 9.3 9.2      NUTRITION - FOCUSED PHYSICAL EXAM:  Deferred  Diet Order:   Diet Order  DIET DYS 3 Room service appropriate? Yes; Fluid consistency: Thin  Diet effective now                   EDUCATION NEEDS:   Not appropriate for education at this time  Skin:  Skin Assessment: Skin Integrity Issues: Skin Integrity Issues:: Other (Comment) Other: IAD buttocks  Last BM:  11/04/23  Height:   Ht Readings from Last 1 Encounters:  10/31/23 6' (1.829 m)    Weight:   Wt Readings from Last 1 Encounters:  11/05/23 124 kg    Ideal Body Weight:     BMI:  Body mass index is 37.08 kg/m.  Estimated Nutritional Needs:   Kcal:  2400-2700 kcal/d  Protein:  105-120 g/d  Fluid:  2000-2500 ml/d    Jamelle Haring RDN, LDN Clinical Dietitian  RDN pager # available on Amion

## 2023-11-05 NOTE — Progress Notes (Signed)
PROGRESS NOTE    MARIS HASTON  ZOX:[id number] DOB: 09-Feb-1961 DOA: 10/30/2023 PCP: Patient, No Pcp Per   Brief Narrative:   62M with cirrhosis, alcohol dependence (drinks two 1/5ths of liquor weekly) and severe bilateral lower extremity lymphedema who was admitted for AMS after he was found down at home. Initial CT head is negative. Spot EEG shows diffuse encephalopathy. Ammonia level elevateed at 43. Alcohol level undetectable. CT Abdomen with dilated bowel loops with fluid filled stool and some thickening of the walls concerning for colitis. There is concern for wall thickening of the Appendeix. General Surgery consulted with low suspicion for appendicitis.    Assessment & Plan:   Active Problems:   Hepatic encephalopathy (HCC)   Acute encephalopathy, hepatic with possible metabolic component Concurrent lactic acidosis, poa -Elevated ammonia improving with lactulose  -Lactic acidosis likely related to above -no signs or symptoms of infection -CT head, EEG without overt abnormality -NG tube in place for medication administration, tolerating soft diet -MRI without overt acute or reversible findings -Patient much improved, did discontinue NG tube   Alcohol Use Disorder w/ hx of withdrawal w/o seizures  -phenobarbital taper ongoing -Likely playing a role in patient's mental status as above -thiamine, folate, multivitamin per protocol -Continue to follow CIWA protocol   Colitis vs Ileitis -acute appendicitis ruled out -General Surgery consulted at intake, imaging reviewed not acute appendicitis given patient's lack of abdominal findings and clinical status. -Appendicolith noted on CT -NG tube initially placed for decompression   HTN -Currently holding medications, resume once appropriate   Lymphedema -Reportedly chronic, follow up outpatient for further evaluation workup   Possible OSA -Recommend outpatient sleep study   Severe hypokalemia -Significantly low this  morning as well despite aggressive replacement yesterday, continue to replace   Moderate to severe protein caloric malnutrition, POA -Will start on supplements -Will consult nutritionist  Thrombocytopenia -secondary to alcohol, improving  Bilateral  conjunctivitis -on ofloxacin eyedrops  Hypokalemia -Potassium remains significantly low today at 2.8, repleted both IV and p.o.  Hyperammonemia -Improving after increasing his lactulose dose but he is with significant diarrhea, so I will crease his lactulose and check ammonia level in a.m.  Prediabetes -Patient denies history of glucose intolerance, A1c 5.8 -Recommend improved diet and exercise  Skin excoriation -Continue wound care    DVT prophylaxis: SCDs Start: 10/31/23 0326, Lovenox Code Status:   Code Status: Full Code Family Communication: None present  Status is: Inpatient  Dispo: CIR Consultants:  PCCM  Procedures:  None  Antimicrobials:  None indicated  Subjective: Patient remained with significant diarrhea leaking through his Flexi-Seal Objective: Vitals:   11/05/23 0400 11/05/23 0500 11/05/23 0756 11/05/23 0800  BP: 117/68  134/78 115/65  Pulse: 90  97   Resp: 14  20 13   Temp: 98.3 F (36.8 C)  98 F (36.7 C)   TempSrc: Oral  Oral   SpO2: 97%  98%   Weight:  124 kg    Height:        Intake/Output Summary (Last 24 hours) at 11/05/2023 1327 Last data filed at 11/05/2023 1002 Gross per 24 hour  Intake 720 ml  Output 745 ml  Net -25 ml   Filed Weights   11/02/23 0330 11/04/23 0500 11/05/23 0500  Weight: 124.9 kg 123 kg 124 kg    Examination:    Awake Alert, conversant, pleasant, remains mildly confused Symmetrical Chest wall movement, Good air movement bilaterally, CTAB RRR,No Gallops,Rubs or new Murmurs, No Parasternal Heave +ve B.Sounds, Abd  Soft, No tenderness, No rebound - guarding or rigidity. No Cyanosis, chronic lower extremity skin changes   Data Reviewed: I have personally  reviewed following labs and imaging studies  CBC: Recent Labs  Lab 10/30/23 2336 10/31/23 0009 11/01/23 0306 11/02/23 1058 11/03/23 0347 11/04/23 0354 11/05/23 0250  WBC 6.9  --  5.4 5.3 5.0 6.1 6.1  NEUTROABS 5.4  --   --   --   --   --   --   HGB 15.3   < > 14.0 12.3* 12.3* 11.9* 12.2*  HCT 45.0   < > 41.7 36.2* 35.9* 34.9* 35.3*  MCV 100.7*  --  100.5* 99.7 98.6 98.6 97.5  PLT 162  --  87* 122* 126* 127* 119*   < > = values in this interval not displayed.   Basic Metabolic Panel: Recent Labs  Lab 10/31/23 1425 11/01/23 0306 11/02/23 1058 11/03/23 0347 11/03/23 1403 11/04/23 0354 11/05/23 0250  NA 140   < > 137 138 135 138 138  K 3.3*   < > 2.2* 2.3* 2.8* 2.7* 2.9*  CL 106   < > 107 110 108 113* 114*  CO2 21*   < > 19* 19* 18* 19* 19*  GLUCOSE 120*   < > 123* 117* 136* 116* 103*  BUN 19   < > 12 8 9 7  <5*  CREATININE 0.87   < > 0.56* 0.48* 0.59* 0.53* 0.51*  CALCIUM 9.2   < > 8.9 8.7* 8.5* 8.8* 8.6*  MG 2.3  --  1.8 1.7  --  1.8 1.7  PHOS  --   --  3.2 3.1  --  2.3* 3.3   < > = values in this interval not displayed.   GFR: Estimated Creatinine Clearance: 133.6 mL/min (A) (by C-G formula based on SCr of 0.51 mg/dL (L)). Liver Function Tests: Recent Labs  Lab 10/30/23 2336 10/31/23 1425 11/01/23 0306  AST 44* 39 40  ALT 25 22 25   ALKPHOS 75 66 65  BILITOT 9.4* 9.3* 8.8*  PROT 8.7* 8.0 8.0  ALBUMIN 2.8* 2.6* 2.7*   Recent Labs  Lab 10/30/23 2336  LIPASE 35   Recent Labs  Lab 10/31/23 0127 11/01/23 0306 11/02/23 1313  AMMONIA 42* 72* 43*   Coagulation Profile: Recent Labs  Lab 10/30/23 2336  INR 1.4*   Cardiac Enzymes: No results for input(s): "CKTOTAL", "CKMB", "CKMBINDEX", "TROPONINI" in the last 168 hours. BNP (last 3 results) No results for input(s): "PROBNP" in the last 8760 hours. HbA1C: No results for input(s): "HGBA1C" in the last 72 hours.  CBG: Recent Labs  Lab 11/04/23 2051 11/05/23 0024 11/05/23 0425 11/05/23 0756  11/05/23 1146  GLUCAP 127* 110* 117* 101* 127*   Lipid Profile: No results for input(s): "CHOL", "HDL", "LDLCALC", "TRIG", "CHOLHDL", "LDLDIRECT" in the last 72 hours. Thyroid Function Tests: No results for input(s): "TSH", "T4TOTAL", "FREET4", "T3FREE", "THYROIDAB" in the last 72 hours.  Anemia Panel: No results for input(s): "VITAMINB12", "FOLATE", "FERRITIN", "TIBC", "IRON", "RETICCTPCT" in the last 72 hours.  Sepsis Labs: Recent Labs  Lab 10/31/23 0009 10/31/23 0127 10/31/23 0137 10/31/23 0154 10/31/23 0356  PROCALCITON  --  <0.10  --   --   --   LATICACIDVEN 3.8*  --  >15.0* 0.8 1.1    Recent Results (from the past 240 hour(s))  Culture, blood (Routine x 2)     Status: None   Collection Time: 10/30/23 11:34 PM   Specimen: BLOOD LEFT ARM  Result Value Ref  Range Status   Specimen Description BLOOD LEFT ARM  Final   Special Requests   Final    BOTTLES DRAWN AEROBIC AND ANAEROBIC Blood Culture adequate volume   Culture   Final    NO GROWTH 5 DAYS Performed at [facility], [address], [place], [place] [address]    Report Status 11/04/2023 FINAL  Final  Culture, blood (Routine x 2)     Status: None   Collection Time: 10/30/23 11:34 PM   Specimen: BLOOD RIGHT ARM  Result Value Ref Range Status   Specimen Description BLOOD RIGHT ARM  Final   Special Requests   Final    BOTTLES DRAWN AEROBIC AND ANAEROBIC Blood Culture adequate volume   Culture   Final    NO GROWTH 5 DAYS Performed at [facility], [address] [place], [place] [address]    Report Status 11/04/2023 FINAL  Final  Urine Culture     Status: None   Collection Time: 10/31/23  2:56 AM   Specimen: Urine, Catheterized  Result Value Ref Range Status   Specimen Description URINE, CATHETERIZED  Final   Special Requests NONE  Final   Culture   Final    NO GROWTH Performed at [facility], [address], [place], [place] [address]    Report Status 11/01/2023 FINAL  Final  MRSA  Next Gen by PCR, Nasal     Status: None   Collection Time: 10/31/23  3:56 AM   Specimen: Nasal Mucosa; Nasal Swab  Result Value Ref Range Status   MRSA by PCR Next Gen NOT DETECTED NOT DETECTED Final    Comment: (NOTE) The GeneXpert MRSA Assay (FDA approved for NASAL specimens only), is one component of a comprehensive MRSA colonization surveillance program. It is not intended to diagnose MRSA infection nor to guide or monitor treatment for MRSA infections. Test performance is not FDA approved in patients less than 59 years old. Performed at [facility], [address], [place], [place] [address]   Gastrointestinal Panel by PCR , Stool     Status: None   Collection Time: 11/03/23 10:28 PM   Specimen: Stool  Result Value Ref Range Status   Campylobacter species NOT DETECTED NOT DETECTED Final   Plesimonas shigelloides NOT DETECTED NOT DETECTED Final   Salmonella species NOT DETECTED NOT DETECTED Final   Yersinia enterocolitica NOT DETECTED NOT DETECTED Final   Vibrio species NOT DETECTED NOT DETECTED Final   Vibrio cholerae NOT DETECTED NOT DETECTED Final   Enteroaggregative E coli (EAEC) NOT DETECTED NOT DETECTED Final   Enteropathogenic E coli (EPEC) NOT DETECTED NOT DETECTED Final   Enterotoxigenic E coli (ETEC) NOT DETECTED NOT DETECTED Final   Shiga like toxin producing E coli (STEC) NOT DETECTED NOT DETECTED Final   Shigella/Enteroinvasive E coli (EIEC) NOT DETECTED NOT DETECTED Final   Cryptosporidium NOT DETECTED NOT DETECTED Final   Cyclospora cayetanensis NOT DETECTED NOT DETECTED Final   Entamoeba histolytica NOT DETECTED NOT DETECTED Final   Giardia lamblia NOT DETECTED NOT DETECTED Final   Adenovirus F40/41 NOT DETECTED NOT DETECTED Final   Astrovirus NOT DETECTED NOT DETECTED Final   Norovirus GI/GII NOT DETECTED NOT DETECTED Final   Rotavirus A NOT DETECTED NOT DETECTED Final   Sapovirus (I, II, IV, and V) NOT DETECTED NOT DETECTED Final    Comment:  Performed at [facility], [address], [place], [place] [address]  Radiology Studies: No results found.  Scheduled Meds:  Chlorhexidine Gluconate Cloth  6 each Topical Daily   enoxaparin (LOVENOX) injection  60 mg Subcutaneous Q24H   feeding supplement  237 mL Oral BID BM   folic acid  1 mg Oral Daily   Gerhardt's butt cream   Topical TID   influenza vac split trivalent PF  0.5 mL Intramuscular Tomorrow-1000   insulin aspart  0-9 Units Subcutaneous Q4H   lactulose  10 g Oral BID   multivitamin with minerals  1 tablet Oral Daily   ofloxacin  2 drop Both Eyes QID   potassium chloride  40 mEq Oral Q4H   tamsulosin  0.4 mg Oral Daily   thiamine  100 mg Oral Daily   Continuous Infusions:      LOS: 5 days    Huey Bienenstock, MD [facility]  If 7PM-7AM, please contact night-coverage [email or web address]  11/05/2023, 1:27 PM

## 2023-11-06 LAB — CBC
HCT: 36.3 % — ABNORMAL LOW (ref 39.0–52.0)
Hemoglobin: 12.6 g/dL — ABNORMAL LOW (ref 13.0–17.0)
MCH: 34 pg (ref 26.0–34.0)
MCHC: 34.7 g/dL (ref 30.0–36.0)
MCV: 97.8 fL (ref 80.0–100.0)
Platelets: 135 10*3/uL — ABNORMAL LOW (ref 150–400)
RBC: 3.71 MIL/uL — ABNORMAL LOW (ref 4.22–5.81)
RDW: 14.2 % (ref 11.5–15.5)
WBC: 6.9 10*3/uL (ref 4.0–10.5)
nRBC: 0 % (ref 0.0–0.2)

## 2023-11-06 LAB — BASIC METABOLIC PANEL
Anion gap: 5 (ref 5–15)
BUN: 7 mg/dL (ref 6–20)
CO2: 17 mmol/L — ABNORMAL LOW (ref 22–32)
Calcium: 8.6 mg/dL — ABNORMAL LOW (ref 8.9–10.3)
Chloride: 114 mmol/L — ABNORMAL HIGH (ref 98–111)
Creatinine, Ser: 0.53 mg/dL — ABNORMAL LOW (ref 0.61–1.24)
GFR, Estimated: >60 mL/min (ref >60)
Glucose, Bld: 133 mg/dL — ABNORMAL HIGH (ref 70–99)
Potassium: 3.6 mmol/L (ref 3.5–5.1)
Sodium: 136 mmol/L (ref 135–145)

## 2023-11-06 LAB — GLUCOSE, CAPILLARY
Glucose-Capillary: 105 mg/dL — ABNORMAL HIGH (ref 70–99)
Glucose-Capillary: 124 mg/dL — ABNORMAL HIGH (ref 70–99)
Glucose-Capillary: 124 mg/dL — ABNORMAL HIGH (ref 70–99)
Glucose-Capillary: 131 mg/dL — ABNORMAL HIGH (ref 70–99)
Glucose-Capillary: 131 mg/dL — ABNORMAL HIGH (ref 70–99)
Glucose-Capillary: 144 mg/dL — ABNORMAL HIGH (ref 70–99)

## 2023-11-06 LAB — MAGNESIUM: Magnesium: 1.8 mg/dL (ref 1.7–2.4)

## 2023-11-06 LAB — PHOSPHORUS: Phosphorus: 2.7 mg/dL (ref 2.5–4.6)

## 2023-11-06 LAB — AMMONIA: Ammonia: 63 umol/L — ABNORMAL HIGH (ref 9–35)

## 2023-11-06 MED ORDER — CARVEDILOL 6.25 MG PO TABS: 6.2500 mg | ORAL_TABLET | Freq: Two times a day (BID) | ORAL | Status: DC

## 2023-11-06 MED ORDER — DILTIAZEM HCL 25 MG/5ML IV SOLN
10.0000 mg | Freq: Once | INTRAVENOUS | Status: DC
  Filled 2023-11-06: qty 5

## 2023-11-06 MED ORDER — CARVEDILOL 12.5 MG PO TABS
12.5000 mg | ORAL_TABLET | Freq: Two times a day (BID) | ORAL | Status: DC
  Administered 2023-11-06 – 2023-11-08 (×3): 12.5 mg via ORAL
  Filled 2023-11-06 (×3): qty 1

## 2023-11-06 MED ORDER — SPIRONOLACTONE 25 MG PO TABS
25.0000 mg | ORAL_TABLET | Freq: Every day | ORAL | Status: DC
  Administered 2023-11-06 – 2023-11-07 (×2): 25 mg via ORAL
  Filled 2023-11-06 (×2): qty 1

## 2023-11-06 MED ORDER — LACTULOSE 10 GM/15ML PO SOLN: 10.0000 g | Freq: Three times a day (TID) | ORAL | Status: DC

## 2023-11-06 MED ORDER — LACTULOSE 10 GM/15ML PO SOLN
10.0000 g | Freq: Two times a day (BID) | ORAL | Status: DC
  Administered 2023-11-06 – 2023-11-08 (×5): 10 g via ORAL
  Filled 2023-11-06 (×4): qty 30

## 2023-11-06 MED ORDER — POTASSIUM CHLORIDE CRYS ER 10 MEQ PO TBCR
30.0000 meq | EXTENDED_RELEASE_TABLET | Freq: Four times a day (QID) | ORAL | Status: AC
Start: 2023-11-06 — End: 2023-11-06
  Administered 2023-11-06 (×2): 30 meq via ORAL
  Filled 2023-11-06 (×2): qty 3

## 2023-11-06 MED ORDER — MAGNESIUM SULFATE IN D5W 1-5 GM/100ML-% IV SOLN
1.0000 g | Freq: Once | INTRAVENOUS | Status: AC
  Administered 2023-11-06: 1 g via INTRAVENOUS
  Filled 2023-11-06: qty 100

## 2023-11-06 NOTE — Progress Notes (Signed)
PROGRESS NOTE    Brian [name]  HQI:[id number] DOB: 07-24-1961 DOA: 10/30/2023 PCP: Patient, No Pcp Per   Brief Narrative:   62M with cirrhosis, alcohol dependence (drinks two 1/5ths of liquor weekly) and severe bilateral lower extremity lymphedema who was admitted for AMS after he was found down at home. Initial CT head is negative. Spot EEG shows diffuse encephalopathy. Ammonia level elevateed at 43. Alcohol level undetectable. CT Abdomen with dilated bowel loops with fluid filled stool and some thickening of the walls concerning for colitis. There is concern for wall thickening of the Appendeix. General Surgery consulted with low suspicion for appendicitis.    Assessment & Plan:   Active Problems:   Hepatic encephalopathy (HCC)   Acute encephalopathy, hepatic with possible metabolic component Concurrent lactic acidosis, poa -Elevated ammonia improving with lactulose  -Lactic acidosis likely related to above -no signs or symptoms of infection -CT head, EEG without overt abnormality -NG tube in place for medication administration, tolerating soft diet -MRI without overt acute or reversible findings -Patient much improved, did discontinue NG tube -Mentation much improved. -Start on low-dose Aldactone   Alcohol Use Disorder w/ hx of withdrawal w/o seizures  -phenobarbital taper ongoing -Likely playing a role in patient's mental status as above -thiamine, folate, multivitamin per protocol -Continue to follow CIWA protocol   Colitis vs Ileitis -acute appendicitis ruled out -General Surgery consulted at intake, imaging reviewed not acute appendicitis given patient's lack of abdominal findings and clinical status. -Appendicolith noted on CT -NG tube initially placed for decompression   HTN -Blood pressures currently more stable, so we will start on beta-blockers -Appears to be with significant tachycardia, looks regular, but will give 1 dose of Cardizem to slow heart rate for  further evaluation.   Lymphedema -Reportedly chronic, follow up outpatient for further evaluation workup   Possible OSA -Recommend outpatient sleep study   Severe hypokalemia -Significantly low this morning as well despite aggressive replacement yesterday, continue to replace, will start on low-dose Aldactone   Moderate to severe protein caloric malnutrition, POA -Will start on supplements -Will consult nutritionist  Thrombocytopenia -secondary to alcohol, improving  Bilateral  conjunctivitis -on ofloxacin eyedrops  Hypokalemia -Potassium remains significantly low today at 2.8, repleted both IV and p.o.  Hyperammonemia -Improving after increasing his lactulose dose but he is with significant diarrhea, so I will crease his lactulose, ammonia level is on the higher side at 63 this morning, but mentation continues to improve so I will hold on increasing further  Prediabetes -Patient denies history of glucose intolerance, A1c 5.8 -Recommend improved diet and exercise  Skin excoriation -Continue wound care    DVT prophylaxis: SCDs Start: 10/31/23 0326, Lovenox Code Status:   Code Status: Full Code Family Communication: None present  Status is: Inpatient  Dispo: CIR Consultants:  PCCM  Procedures:  None  Antimicrobials:  None indicated  Subjective: DC'd Flexi-Seal this morning, patient denies any complaints today Objective: Vitals:   11/06/23 0503 11/06/23 0800 11/06/23 1150 11/06/23 1200  BP: (!) 153/73 138/81 112/74 126/68  Pulse: (!) 107 (!) 110 96   Resp: 20 20 (!) 22 (!) 25  Temp: 98.2 F (36.8 C) 98.6 F (37 C) 98.4 F (36.9 C)   TempSrc: Oral Oral Oral   SpO2: 96%     Weight:      Height:        Intake/Output Summary (Last 24 hours) at 11/06/2023 1342 Last data filed at 11/06/2023 1028 Gross per 24 hour  Intake --  Output 790 ml  Net -790 ml   Filed Weights   11/04/23 0500 11/05/23 0500 11/06/23 0500  Weight: 123 kg 124 kg 124 kg     Examination:    Awake Alert, conversant, pleasant, remains mildly confused Symmetrical Chest wall movement, Good air movement bilaterally, CTAB RRR,No Gallops,Rubs or new Murmurs, No Parasternal Heave +ve B.Sounds, Abd Soft, No tenderness, No rebound - guarding or rigidity. No Cyanosis, Clubbing, chronic lower extremity skin edema.   Data Reviewed: I have personally reviewed following labs and imaging studies  CBC: Recent Labs  Lab 10/30/23 2336 10/31/23 0009 11/02/23 1058 11/03/23 0347 11/04/23 0354 11/05/23 0250 11/06/23 0414  WBC 6.9   < > 5.3 5.0 6.1 6.1 6.9  NEUTROABS 5.4  --   --   --   --   --   --   HGB 15.3   < > 12.3* 12.3* 11.9* 12.2* 12.6*  HCT 45.0   < > 36.2* 35.9* 34.9* 35.3* 36.3*  MCV 100.7*   < > 99.7 98.6 98.6 97.5 97.8  PLT 162   < > 122* 126* 127* 119* 135*   < > = values in this interval not displayed.   Basic Metabolic Panel: Recent Labs  Lab 11/02/23 1058 11/03/23 0347 11/03/23 1403 11/04/23 0354 11/05/23 0250 11/06/23 0414  NA 137 138 135 138 138 136  K 2.2* 2.3* 2.8* 2.7* 2.9* 3.6  CL 107 110 108 113* 114* 114*  CO2 19* 19* 18* 19* 19* 17*  GLUCOSE 123* 117* 136* 116* 103* 133*  BUN 12 8 9 7  <5* 7  CREATININE 0.56* 0.48* 0.59* 0.53* 0.51* 0.53*  CALCIUM 8.9 8.7* 8.5* 8.8* 8.6* 8.6*  MG 1.8 1.7  --  1.8 1.7 1.8  PHOS 3.2 3.1  --  2.3* 3.3 2.7   GFR: Estimated Creatinine Clearance: 133.6 mL/min (A) (by C-G formula based on SCr of 0.53 mg/dL (L)). Liver Function Tests: Recent Labs  Lab 10/30/23 2336 10/31/23 1425 11/01/23 0306  AST 44* 39 40  ALT 25 22 25   ALKPHOS 75 66 65  BILITOT 9.4* 9.3* 8.8*  PROT 8.7* 8.0 8.0  ALBUMIN 2.8* 2.6* 2.7*   Recent Labs  Lab 10/30/23 2336  LIPASE 35   Recent Labs  Lab 10/31/23 0127 11/01/23 0306 11/02/23 1313 11/06/23 0414  AMMONIA 42* 72* 43* 63*   Coagulation Profile: Recent Labs  Lab 10/30/23 2336  INR 1.4*   Cardiac Enzymes: No results for input(s): "CKTOTAL",  "CKMB", "CKMBINDEX", "TROPONINI" in the last 168 hours. BNP (last 3 results) No results for input(s): "PROBNP" in the last 8760 hours. HbA1C: No results for input(s): "HGBA1C" in the last 72 hours.  CBG: Recent Labs  Lab 11/05/23 2018 11/05/23 2313 11/06/23 0419 11/06/23 0822 11/06/23 1149  GLUCAP 133* 141* 131* 124* 144*   Lipid Profile: No results for input(s): "CHOL", "HDL", "LDLCALC", "TRIG", "CHOLHDL", "LDLDIRECT" in the last 72 hours. Thyroid Function Tests: No results for input(s): "TSH", "T4TOTAL", "FREET4", "T3FREE", "THYROIDAB" in the last 72 hours.  Anemia Panel: No results for input(s): "VITAMINB12", "FOLATE", "FERRITIN", "TIBC", "IRON", "RETICCTPCT" in the last 72 hours.  Sepsis Labs: Recent Labs  Lab 10/31/23 0009 10/31/23 0127 10/31/23 0137 10/31/23 0154 10/31/23 0356  PROCALCITON  --  <0.10  --   --   --   LATICACIDVEN 3.8*  --  >15.0* 0.8 1.1    Recent Results (from the past 240 hour(s))  Culture, blood (Routine x 2)     Status: None  Collection Time: 10/30/23 11:34 PM   Specimen: BLOOD LEFT ARM  Result Value Ref Range Status   Specimen Description BLOOD LEFT ARM  Final   Special Requests   Final    BOTTLES DRAWN AEROBIC AND ANAEROBIC Blood Culture adequate volume   Culture   Final    NO GROWTH 5 DAYS Performed at [facility], [address] [place], [place] [address]    Report Status 11/04/2023 FINAL  Final  Culture, blood (Routine x 2)     Status: None   Collection Time: 10/30/23 11:34 PM   Specimen: BLOOD RIGHT ARM  Result Value Ref Range Status   Specimen Description BLOOD RIGHT ARM  Final   Special Requests   Final    BOTTLES DRAWN AEROBIC AND ANAEROBIC Blood Culture adequate volume   Culture   Final    NO GROWTH 5 DAYS Performed at [facility], [address] [place], [place] [address]    Report Status 11/04/2023 FINAL  Final  Urine Culture     Status: None   Collection Time: 10/31/23  2:56 AM   Specimen: Urine,  Catheterized  Result Value Ref Range Status   Specimen Description URINE, CATHETERIZED  Final   Special Requests NONE  Final   Culture   Final    NO GROWTH Performed at [facility], [address] [place], [place] [address]    Report Status 11/01/2023 FINAL  Final  MRSA Next Gen by PCR, Nasal     Status: None   Collection Time: 10/31/23  3:56 AM   Specimen: Nasal Mucosa; Nasal Swab  Result Value Ref Range Status   MRSA by PCR Next Gen NOT DETECTED NOT DETECTED Final    Comment: (NOTE) The GeneXpert MRSA Assay (FDA approved for NASAL specimens only), is one component of a comprehensive MRSA colonization surveillance program. It is not intended to diagnose MRSA infection nor to guide or monitor treatment for MRSA infections. Test performance is not FDA approved in patients less than 52 years old. Performed at [facility], [address], [place], [place] [address]   Gastrointestinal Panel by PCR , Stool     Status: None   Collection Time: 11/03/23 10:28 PM   Specimen: Stool  Result Value Ref Range Status   Campylobacter species NOT DETECTED NOT DETECTED Final   Plesimonas shigelloides NOT DETECTED NOT DETECTED Final   Salmonella species NOT DETECTED NOT DETECTED Final   Yersinia enterocolitica NOT DETECTED NOT DETECTED Final   Vibrio species NOT DETECTED NOT DETECTED Final   Vibrio cholerae NOT DETECTED NOT DETECTED Final   Enteroaggregative E coli (EAEC) NOT DETECTED NOT DETECTED Final   Enteropathogenic E coli (EPEC) NOT DETECTED NOT DETECTED Final   Enterotoxigenic E coli (ETEC) NOT DETECTED NOT DETECTED Final   Shiga like toxin producing E coli (STEC) NOT DETECTED NOT DETECTED Final   Shigella/Enteroinvasive E coli (EIEC) NOT DETECTED NOT DETECTED Final   Cryptosporidium NOT DETECTED NOT DETECTED Final   Cyclospora cayetanensis NOT DETECTED NOT DETECTED Final   Entamoeba histolytica NOT DETECTED NOT DETECTED Final   Giardia lamblia NOT DETECTED NOT DETECTED  Final   Adenovirus F40/41 NOT DETECTED NOT DETECTED Final   Astrovirus NOT DETECTED NOT DETECTED Final   Norovirus GI/GII NOT DETECTED NOT DETECTED Final   Rotavirus A NOT DETECTED NOT DETECTED Final   Sapovirus (I, II, IV, and V) NOT DETECTED NOT DETECTED Final    Comment: Performed at [facility], [address] [place], [place] [address]         Radiology Studies: No results found.  Scheduled Meds:  carvedilol  6.25 mg Oral BID WC   Chlorhexidine Gluconate Cloth  6 each Topical Daily   diltiazem  10 mg Intravenous Once   enoxaparin (LOVENOX) injection  60 mg Subcutaneous Q24H   feeding supplement  237 mL Oral BID BM   folic acid  1 mg Oral Daily   Gerhardt's butt cream   Topical TID   influenza vac split trivalent PF  0.5 mL Intramuscular Tomorrow-1000   insulin aspart  0-9 Units Subcutaneous Q4H   lactulose  10 g Oral BID   multivitamin with minerals  1 tablet Oral Daily   ofloxacin  2 drop Both Eyes QID   spironolactone  25 mg Oral Daily   tamsulosin  0.4 mg Oral Daily   thiamine  100 mg Oral Daily   Continuous Infusions:  magnesium sulfate bolus IVPB         LOS: 6 days    Huey Bienenstock, MD [facility]  If 7PM-7AM, please contact night-coverage [email or web address]  11/06/2023, 1:42 PM

## 2023-11-06 NOTE — Plan of Care (Signed)
  Problem: Coping: Goal: Ability to adjust to condition or change in health will improve Outcome: Progressing   Problem: Metabolic: Goal: Ability to maintain appropriate glucose levels will improve Outcome: Progressing   Problem: Nutritional: Goal: Maintenance of adequate nutrition will improve Outcome: Progressing   Problem: Skin Integrity: Goal: Risk for impaired skin integrity will decrease Outcome: Progressing   

## 2023-11-06 NOTE — Progress Notes (Signed)
Physical Therapy Treatment Patient Details Name: Brian [name] MRN: [id number] DOB: [birth year] Today's Date: 11/06/2023   History of Present Illness 41M with cirrhosis, alcohol dependence (drinks two 1/5ths of liquor weekly) and severe bilateral lower extremity lymphedema who was admitted for AMS after he was found down at home. Initial CT head is negative. Spot EEG shows diffuse encephalopathy. PMHx: Alcoholic, pancreatitis, HTN, morbid obesity    PT Comments  Pt with fair tolerance to treatment today. Session today limited due to pt HR jumping to 156 upon standing. RN made aware. Pt required CGA/Min A for bed mobility today however +2 Max A to stand with RW. Pt likely still needs the stedy to transfer to chair at this moment. No change in DC/DME recs at this time. PT will continue to follow.     If plan is discharge home, recommend the following: Two people to help with walking and/or transfers;Two people to help with bathing/dressing/bathroom;Assist for transportation;Help with stairs or ramp for entrance;Assistance with cooking/housework   Can travel by private vehicle     No  Equipment Recommendations  None recommended by PT    Recommendations for Other Services       Precautions / Restrictions Precautions Precautions: Fall Precaution Comments: Watch HR. Bowel incontinence Restrictions Weight Bearing Restrictions: No     Mobility  Bed Mobility Overal bed mobility: Needs Assistance Bed Mobility: Supine to Sit, Sit to Supine, Rolling Rolling: Min assist   Supine to sit: Contact guard, Used rails, HOB elevated Sit to supine: Min assist, HOB elevated   General bed mobility comments: Pt able to sit EOB on his own with no physical assistance and use of rails however needed assistance with BLE management upon returning to bed for pericare.    Transfers Overall transfer level: Needs assistance Equipment used: Rolling walker (2 wheels) Transfers: Sit to/from Stand Sit to  Stand: +2 physical assistance, Max assist           General transfer comment: pt able to stand with bari RW with +2 Max A however unable to come fully upright and noted to have BM upon standing along with HR increasing to 156. RN made aware    Ambulation/Gait               General Gait Details: unable   Stairs             Wheelchair Mobility     Tilt Bed    Modified Rankin (Stroke Patients Only)       Balance Overall balance assessment: Needs assistance Sitting-balance support: Feet supported Sitting balance-Leahy Scale: Good Sitting balance - Comments: able to correct when balance is challenged   Standing balance support: Bilateral upper extremity supported Standing balance-Leahy Scale: Poor Standing balance comment: Reliant on external support.                            Cognition Arousal: Alert Behavior During Therapy: WFL for tasks assessed/performed Overall Cognitive Status: Within Functional Limits for tasks assessed                                          Exercises      General Comments General comments (skin integrity, edema, etc.): HR up to 156 upon standing. RN made aware.      Pertinent Vitals/Pain Pain Assessment Pain Assessment: No/denies pain  Home Living                          Prior Function            PT Goals (current goals can now be found in the care plan section) Progress towards PT goals: Progressing toward goals    Frequency    Min 1X/week      PT Plan      Co-evaluation              AM-PAC PT "6 Clicks" Mobility   Outcome Measure  Help needed turning from your back to your side while in a flat bed without using bedrails?: A Little Help needed moving from lying on your back to sitting on the side of a flat bed without using bedrails?: A Lot Help needed moving to and from a bed to a chair (including a wheelchair)?: Total Help needed standing up from a  chair using your arms (e.g., wheelchair or bedside chair)?: Total Help needed to walk in hospital room?: Total Help needed climbing 3-5 steps with a railing? : Total 6 Click Score: 9    End of Session Equipment Utilized During Treatment: Gait belt Activity Tolerance: Patient limited by fatigue;Treatment limited secondary to medical complications (Comment) (HR) Patient left: in bed;with call bell/phone within reach;with nursing/sitter in room Nurse Communication: Mobility status;Need for lift equipment PT Visit Diagnosis: Other abnormalities of gait and mobility (R26.89);Muscle weakness (generalized) (M62.81);Other symptoms and signs involving the nervous system (R29.898)     Time: 4098-1191 PT Time Calculation (min) (ACUTE ONLY): 22 min  Charges:    $Therapeutic Activity: 8-22 mins PT General Charges $$ ACUTE PT VISIT: 1 Visit                     Shela Nevin, PT, DPT Acute Rehab Services [phone]    [name] 11/06/2023, 12:29 PM

## 2023-11-07 LAB — GLUCOSE, CAPILLARY
Glucose-Capillary: 118 mg/dL — ABNORMAL HIGH (ref 70–99)
Glucose-Capillary: 145 mg/dL — ABNORMAL HIGH (ref 70–99)
Glucose-Capillary: 148 mg/dL — ABNORMAL HIGH (ref 70–99)
Glucose-Capillary: 151 mg/dL — ABNORMAL HIGH (ref 70–99)
Glucose-Capillary: 161 mg/dL — ABNORMAL HIGH (ref 70–99)
Glucose-Capillary: 82 mg/dL (ref 70–99)
Glucose-Capillary: 88 mg/dL (ref 70–99)

## 2023-11-07 LAB — BASIC METABOLIC PANEL
Anion gap: 3 — ABNORMAL LOW (ref 5–15)
BUN: 8 mg/dL (ref 6–20)
CO2: 20 mmol/L — ABNORMAL LOW (ref 22–32)
Calcium: 8.4 mg/dL — ABNORMAL LOW (ref 8.9–10.3)
Chloride: 113 mmol/L — ABNORMAL HIGH (ref 98–111)
Creatinine, Ser: 0.43 mg/dL — ABNORMAL LOW (ref 0.61–1.24)
GFR, Estimated: >60 mL/min (ref >60)
Glucose, Bld: 96 mg/dL (ref 70–99)
Potassium: 3.7 mmol/L (ref 3.5–5.1)
Sodium: 136 mmol/L (ref 135–145)

## 2023-11-07 LAB — PHOSPHORUS: Phosphorus: 3 mg/dL (ref 2.5–4.6)

## 2023-11-07 MED ORDER — POTASSIUM CHLORIDE CRYS ER 20 MEQ PO TBCR
20.0000 meq | EXTENDED_RELEASE_TABLET | Freq: Every day | ORAL | Status: DC
  Administered 2023-11-07 – 2023-11-08 (×2): 20 meq via ORAL
  Filled 2023-11-07 (×2): qty 1

## 2023-11-07 MED ORDER — MIDODRINE HCL 5 MG PO TABS
10.0000 mg | ORAL_TABLET | Freq: Three times a day (TID) | ORAL | Status: DC
  Administered 2023-11-07 – 2023-11-08 (×2): 10 mg via ORAL
  Filled 2023-11-07 (×2): qty 2

## 2023-11-07 MED ORDER — POTASSIUM CHLORIDE CRYS ER 20 MEQ PO TBCR
40.0000 meq | EXTENDED_RELEASE_TABLET | Freq: Once | ORAL | Status: AC
  Administered 2023-11-07: 40 meq via ORAL
  Filled 2023-11-07: qty 2

## 2023-11-07 MED ORDER — MIDODRINE HCL 5 MG PO TABS
5.0000 mg | ORAL_TABLET | Freq: Three times a day (TID) | ORAL | Status: DC
  Administered 2023-11-07 (×2): 5 mg via ORAL
  Filled 2023-11-07 (×2): qty 1

## 2023-11-07 MED ORDER — PERFLUTREN LIPID MICROSPHERE
1.0000 mL | INTRAVENOUS | Status: AC | PRN
  Administered 2023-11-07: 2 mL via INTRAVENOUS

## 2023-11-07 NOTE — Progress Notes (Signed)
Occupational Therapy Treatment Patient Details Name: Brian [name] MRN: [id number] DOB: 03-08-1961 Today's Date: 11/07/2023   History of present illness 68M with cirrhosis, alcohol dependence (drinks two 1/5ths of liquor weekly) and severe bilateral lower extremity lymphedema who was admitted for AMS after he was found down at home. Initial CT head is negative. Spot EEG shows diffuse encephalopathy. PMHx: Alcoholic, pancreatitis, HTN, morbid obesity   OT comments  Pt is making limited progress towards their acute OT goals. Pt was soiled upon arrival therefore session was focused on LB ADLs. Of noted, pt rear peri skin integrity is poor. Pt educated on importance to call staff to assist with peri care asap after an incontinence episode, and to call out again if they do not respond. Overall he needed min A to roll both L&R and ultimately needed total A for peri hygiene. Attempted bridging exercises at bed level, pt unable to complete. OT to continue to follow acutely to facilitate progress towards established goals. Pt will benefit from intensive inpatient follow up therapy, >3 hours/day after discharge but will likely still need assist once discharged home.       If plan is discharge home, recommend the following:  A lot of help with walking and/or transfers;Two people to help with walking and/or transfers;A lot of help with bathing/dressing/bathroom;Two people to help with bathing/dressing/bathroom;Assistance with cooking/housework;Direct supervision/assist for financial management;Direct supervision/assist for medications management;Assist for transportation;Help with stairs or ramp for entrance   Equipment Recommendations  None recommended by OT       Precautions / Restrictions Precautions Precautions: Fall Precaution Comments: Watch HR. Bowel incontinence Restrictions Weight Bearing Restrictions: No       Mobility Bed Mobility Overal bed mobility: Needs Assistance Bed Mobility:  Rolling Rolling: Min assist         General bed mobility comments: several rolls L&R to assist with peri care and linen change    Transfers Overall transfer level: Needs assistance         Balance Overall balance assessment: Needs assistance             ADL either performed or assessed with clinical judgement   ADL Overall ADL's : Needs assistance/impaired             Lower Body Bathing: Maximal assistance;Bed level           Toilet Transfer: Total assistance Toilet Transfer Details (indicate cue type and reason): soiled upon arrival Toileting- Clothing Manipulation and Hygiene: Total assistance;+2 for physical assistance;+2 for safety/equipment;Bed level Toileting - Clothing Manipulation Details (indicate cue type and reason): total A for peri care - multiple bleeding wounds on his bottom.     Functional mobility during ADLs: Minimal assistance (at bed level) General ADL Comments: limited by incontinence    Extremity/Trunk Assessment Upper Extremity Assessment Upper Extremity Assessment: Generalized weakness   Lower Extremity Assessment Lower Extremity Assessment: Defer to PT evaluation        Vision   Vision Assessment?: No apparent visual deficits   Perception Perception Perception: Not tested   Praxis Praxis Praxis: Not tested    Cognition Arousal: Alert Behavior During Therapy: [facility] for tasks assessed/performed Overall Cognitive Status: Within Functional Limits for tasks assessed           General Comments: continues to be [facility], limited insight. Pt soiled upon arrival, he reports he called out over an hour ago without assist. Educated pt on importance or hygiene for skin integrity and to call out again if staff does  not assist in a timely way.              General Comments Pt soiled upon arrival, per report he called out over an hour ago. Pt's rear peir skin has multiple bleeding areas. RN made aware.    Pertinent Vitals/ Pain        Pain Assessment Pain Assessment: No/denies pain         Frequency  Min 1X/week        Progress Toward Goals  OT Goals(current goals can now be found in the care plan section)  Progress towards OT goals: Progressing toward goals  Acute Rehab OT Goals Patient Stated Goal: to go to rehab OT Goal Formulation: With patient Time For Goal Achievement: 11/16/23 Potential to Achieve Goals: Good ADL Goals Pt Will Perform Lower Body Bathing: with set-up;sitting/lateral leans;with adaptive equipment Pt Will Perform Lower Body Dressing: with mod assist;sitting/lateral leans;with adaptive equipment Pt Will Transfer to Toilet: with mod assist;stand pivot transfer;bedside commode Pt Will Perform Toileting - Clothing Manipulation and hygiene: with min assist;sitting/lateral leans Additional ADL Goal #1: Pt will complete bed mobility with min A as a precursor to ADLs   AM-PAC OT "6 Clicks" Daily Activity     Outcome Measure   Help from another person eating meals?: None Help from another person taking care of personal grooming?: A Little Help from another person toileting, which includes using toliet, bedpan, or urinal?: Total Help from another person bathing (including washing, rinsing, drying)?: A Lot Help from another person to put on and taking off regular upper body clothing?: A Little Help from another person to put on and taking off regular lower body clothing?: Total 6 Click Score: 14    End of Session    OT Visit Diagnosis: Unsteadiness on feet (R26.81);Other abnormalities of gait and mobility (R26.89);Muscle weakness (generalized) (M62.81)   Activity Tolerance Patient tolerated treatment well   Patient Left in bed;with call bell/phone within reach;with bed alarm set   Nurse Communication Mobility status        Time: 1301-1320 OT Time Calculation (min): 19 min  Charges: OT General Charges $OT Visit: 1 Visit OT Treatments $Self Care/Home Management : 8-22  mins  Derenda Mis, OTR/L Acute Rehabilitation Services Office [phone] Secure Chat Communication Preferred   Donia Pounds 11/07/2023, 1:39 PM

## 2023-11-07 NOTE — Progress Notes (Signed)
PROGRESS NOTE    DARLY KOS  ZOX:[id number] DOB: [birth year] DOA: 10/30/2023 PCP: Patient, No Pcp Per   Brief Narrative:   23M with cirrhosis, alcohol dependence (drinks two 1/5ths of liquor weekly) and severe bilateral lower extremity lymphedema who was admitted for AMS after he was found down at home. Initial CT head is negative. Spot EEG shows diffuse encephalopathy. Ammonia level elevateed at 43. Alcohol level undetectable. CT Abdomen with dilated bowel loops with fluid filled stool and some thickening of the walls concerning for colitis. There is concern for wall thickening of the [place]. General Surgery consulted with low suspicion for appendicitis.  Encephalopathy much improved, as well patient mentation much improved, awaiting CIR bed availability.   Assessment & Plan:   Active Problems:   Hepatic encephalopathy (HCC)   Acute encephalopathy, hepatic with possible metabolic component Concurrent lactic acidosis, poa -Elevated ammonia improving with lactulose  -Lactic acidosis likely related to above ,no signs or symptoms of infection -CT head, EEG without overt abnormality -NG tube in place for medication administration, tolerating soft diet -MRI without overt acute or reversible findings -Mentation much improved, he has been able to take all his meds and feeding by mouth, so  did discontinue NG tube -Mentation much improved.   Alcohol Use Disorder w/ hx of withdrawal w/o seizures  -phenobarbital taper  -Likely playing a role in patient's mental status as above -thiamine, folate, multivitamin per protocol -Patient much improved.   Colitis vs Ileitis -acute appendicitis ruled out -General Surgery consulted at intake, imaging reviewed not acute appendicitis given patient's lack of abdominal findings and clinical status. -Appendicolith noted on CT -NG tube initially placed for decompression -All much improved.   HTN -With known history of hypertension, but blood  pressure during hospital stay has been noted to be soft to normal, he was started on beta-blockers, did help with his tachycardia as well . -Blood pressure remains soft intermittently, so he was started on midodrine.   Lymphedema -Reportedly chronic, follow up outpatient for further evaluation workup   Possible OSA -Recommend outpatient sleep study   Severe hypokalemia -Required aggressive replacement initially, this currently appears to be stabilized.     Moderate to severe protein caloric malnutrition, POA -Will start on supplements -Nutritionist consulted.  Thrombocytopenia -secondary to alcohol, improving  Bilateral  conjunctivitis -Improved on ofloxacin eyedrops, will discontinue today.  Hypokalemia -Potassium remains significantly low today at 2.8, repleted both IV and p.o.  Hyperammonemia -Improving after increasing his lactulose dose but he is with significant diarrhea, so I will crease his lactulose, ammonia level is on the higher side at 63 this morning, but mentation continues to improve so I will hold on increasing further  Prediabetes -Patient denies history of glucose intolerance, A1c 5.8 -Recommend improved diet and exercise  Skin excoriation -Continue wound care    DVT prophylaxis: SCDs Start: 10/31/23 0326, Lovenox Code Status:   Code Status: Full Code Family Communication: None present  Status is: Inpatient  Dispo: CIR Consultants:  PCCM  Procedures:  None  Antimicrobials:  None indicated  Subjective: No significant events overnight, he denies any complaints today. Objective: Vitals:   11/07/23 0401 11/07/23 0500 11/07/23 0810 11/07/23 1131  BP: 104/63  109/69 (!) 91/56  Pulse: 77     Resp: 20  (!) 22 20  Temp: 98.9 F (37.2 C)  98.5 F (36.9 C) 98.8 F (37.1 C)  TempSrc: Oral  Oral Oral  SpO2: 93%  98% 92%  Weight:  124 kg  Height:        Intake/Output Summary (Last 24 hours) at 11/07/2023 1452 Last data filed at 11/07/2023  1100 Gross per 24 hour  Intake --  Output 700 ml  Net -700 ml   Filed Weights   11/05/23 0500 11/06/23 0500 11/07/23 0500  Weight: 124 kg 124 kg 124 kg    Examination:    Awake Alert, person, pleasant, appropriate but minimally confused Symmetrical Chest wall movement, Good air movement bilaterally, CTAB RRR,No Gallops,Rubs or new Murmurs, No Parasternal Heave +ve B.Sounds, Abd Soft, No tenderness, No rebound - guarding or rigidity. No Cyanosis, Clubbing or edema, chronic lower extremity skin changes   Data Reviewed: I have personally reviewed following labs and imaging studies  CBC: Recent Labs  Lab 11/02/23 1058 11/03/23 0347 11/04/23 0354 11/05/23 0250 11/06/23 0414  WBC 5.3 5.0 6.1 6.1 6.9  HGB 12.3* 12.3* 11.9* 12.2* 12.6*  HCT 36.2* 35.9* 34.9* 35.3* 36.3*  MCV 99.7 98.6 98.6 97.5 97.8  PLT 122* 126* 127* 119* 135*   Basic Metabolic Panel: Recent Labs  Lab 11/02/23 1058 11/03/23 0347 11/03/23 1403 11/04/23 0354 11/05/23 0250 11/06/23 0414 11/07/23 0420  NA 137 138 135 138 138 136 136  K 2.2* 2.3* 2.8* 2.7* 2.9* 3.6 3.7  CL 107 110 108 113* 114* 114* 113*  CO2 19* 19* 18* 19* 19* 17* 20*  GLUCOSE 123* 117* 136* 116* 103* 133* 96  BUN 12 8 9 7  <5* 7 8  CREATININE 0.56* 0.48* 0.59* 0.53* 0.51* 0.53* 0.43*  CALCIUM 8.9 8.7* 8.5* 8.8* 8.6* 8.6* 8.4*  MG 1.8 1.7  --  1.8 1.7 1.8  --   PHOS 3.2 3.1  --  2.3* 3.3 2.7 3.0   GFR: Estimated Creatinine Clearance: 133.6 mL/min (A) (by C-G formula based on SCr of 0.43 mg/dL (L)). Liver Function Tests: Recent Labs  Lab 11/01/23 0306  AST 40  ALT 25  ALKPHOS 65  BILITOT 8.8*  PROT 8.0  ALBUMIN 2.7*   No results for input(s): "LIPASE", "AMYLASE" in the last 168 hours.  Recent Labs  Lab 11/01/23 0306 11/02/23 1313 11/06/23 0414  AMMONIA 72* 43* 63*   Coagulation Profile: No results for input(s): "INR", "PROTIME" in the last 168 hours.  Cardiac Enzymes: No results for input(s): "CKTOTAL",  "CKMB", "CKMBINDEX", "TROPONINI" in the last 168 hours. BNP (last 3 results) No results for input(s): "PROBNP" in the last 8760 hours. HbA1C: No results for input(s): "HGBA1C" in the last 72 hours.  CBG: Recent Labs  Lab 11/06/23 1958 11/06/23 2334 11/07/23 0400 11/07/23 0810 11/07/23 1130  GLUCAP 131* 105* 82 88 161*   Lipid Profile: No results for input(s): "CHOL", "HDL", "LDLCALC", "TRIG", "CHOLHDL", "LDLDIRECT" in the last 72 hours. Thyroid Function Tests: No results for input(s): "TSH", "T4TOTAL", "FREET4", "T3FREE", "THYROIDAB" in the last 72 hours.  Anemia Panel: No results for input(s): "VITAMINB12", "FOLATE", "FERRITIN", "TIBC", "IRON", "RETICCTPCT" in the last 72 hours.  Sepsis Labs: No results for input(s): "PROCALCITON", "LATICACIDVEN" in the last 168 hours.   Recent Results (from the past 240 hour(s))  Culture, blood (Routine x 2)     Status: None   Collection Time: 10/30/23 11:34 PM   Specimen: BLOOD LEFT ARM  Result Value Ref Range Status   Specimen Description BLOOD LEFT ARM  Final   Special Requests   Final    BOTTLES DRAWN AEROBIC AND ANAEROBIC Blood Culture adequate volume   Culture   Final    NO GROWTH 5 DAYS Performed  at [facility], [address] [place], [place] [address]    Report Status 11/04/2023 FINAL  Final  Culture, blood (Routine x 2)     Status: None   Collection Time: 10/30/23 11:34 PM   Specimen: BLOOD RIGHT ARM  Result Value Ref Range Status   Specimen Description BLOOD RIGHT ARM  Final   Special Requests   Final    BOTTLES DRAWN AEROBIC AND ANAEROBIC Blood Culture adequate volume   Culture   Final    NO GROWTH 5 DAYS Performed at [facility], [address], [place], [place] [address]    Report Status 11/04/2023 FINAL  Final  Urine Culture     Status: None   Collection Time: 10/31/23  2:56 AM   Specimen: Urine, Catheterized  Result Value Ref Range Status   Specimen Description URINE, CATHETERIZED  Final    Special Requests NONE  Final   Culture   Final    NO GROWTH Performed at [facility], [address], [place], [place] [address]    Report Status 11/01/2023 FINAL  Final  MRSA Next Gen by PCR, Nasal     Status: None   Collection Time: 10/31/23  3:56 AM   Specimen: Nasal Mucosa; Nasal Swab  Result Value Ref Range Status   MRSA by PCR Next Gen NOT DETECTED NOT DETECTED Final    Comment: (NOTE) The GeneXpert MRSA Assay (FDA approved for NASAL specimens only), is one component of a comprehensive MRSA colonization surveillance program. It is not intended to diagnose MRSA infection nor to guide or monitor treatment for MRSA infections. Test performance is not FDA approved in patients less than 38 years old. Performed at [facility], [address] [place], [place] [address]   Gastrointestinal Panel by PCR , Stool     Status: None   Collection Time: 11/03/23 10:28 PM   Specimen: Stool  Result Value Ref Range Status   Campylobacter species NOT DETECTED NOT DETECTED Final   Plesimonas shigelloides NOT DETECTED NOT DETECTED Final   Salmonella species NOT DETECTED NOT DETECTED Final   Yersinia enterocolitica NOT DETECTED NOT DETECTED Final   Vibrio species NOT DETECTED NOT DETECTED Final   Vibrio cholerae NOT DETECTED NOT DETECTED Final   Enteroaggregative E coli (EAEC) NOT DETECTED NOT DETECTED Final   Enteropathogenic E coli (EPEC) NOT DETECTED NOT DETECTED Final   Enterotoxigenic E coli (ETEC) NOT DETECTED NOT DETECTED Final   Shiga like toxin producing E coli (STEC) NOT DETECTED NOT DETECTED Final   Shigella/Enteroinvasive E coli (EIEC) NOT DETECTED NOT DETECTED Final   Cryptosporidium NOT DETECTED NOT DETECTED Final   Cyclospora cayetanensis NOT DETECTED NOT DETECTED Final   Entamoeba histolytica NOT DETECTED NOT DETECTED Final   Giardia lamblia NOT DETECTED NOT DETECTED Final   Adenovirus F40/41 NOT DETECTED NOT DETECTED Final   Astrovirus NOT DETECTED NOT DETECTED  Final   Norovirus GI/GII NOT DETECTED NOT DETECTED Final   Rotavirus A NOT DETECTED NOT DETECTED Final   Sapovirus (I, II, IV, and V) NOT DETECTED NOT DETECTED Final    Comment: Performed at [facility], [address] [place], [place] [address]         Radiology Studies: ECHOCARDIOGRAM COMPLETE  Result Date: 10/31/2023    ECHOCARDIOGRAM REPORT   Patient Name:   JERRED BASTONE Date of Exam: 10/31/2023 Medical Rec #:  [id number]       Height:  72.0 in Accession #:    [id number]      Weight:       272.5 lb Date of Birth:  25-Aug-1961      BSA:          2.430 m Patient Age:    62 years        BP:           139/80 mmHg Patient Gender: M               HR:           94 bpm. Exam Location:  Inpatient Procedure: 2D Echo, Cardiac Doppler, Color Doppler and Intracardiac            Opacification Agent Indications:    Abnormal ECG R94.31  History:        Patient has no prior history of Echocardiogram examinations.                 Risk Factors:Hypertension.  Sonographer:    Darlys Gales Referring Phys: [id number] OMAR M ALBUSTAMI IMPRESSIONS  1. Left ventricular ejection fraction, by estimation, is 60 to 65%. The left ventricle has normal function. The left ventricle has no regional wall motion abnormalities. There is mild concentric left ventricular hypertrophy. Left ventricular diastolic function could not be evaluated.  2. Right ventricular systolic function is normal. The right ventricular size is normal.  3. The mitral valve is normal in structure. No evidence of mitral valve regurgitation. No evidence of mitral stenosis.  4. The aortic valve is tricuspid. Aortic valve regurgitation is not visualized. Aortic valve sclerosis/calcification is present, without any evidence of aortic stenosis. Aortic valve area, by VTI measures 3.42 cm. Aortic valve mean gradient measures 4.0 mmHg. Aortic valve Vmax measures 1.31 m/s. FINDINGS  Left Ventricle: Left ventricular ejection fraction, by estimation, is  60 to 65%. The left ventricle has normal function. The left ventricle has no regional wall motion abnormalities. The left ventricular internal cavity size was normal in size. There is  mild concentric left ventricular hypertrophy. Left ventricular diastolic function could not be evaluated. Right Ventricle: The right ventricular size is normal. No increase in right ventricular wall thickness. Right ventricular systolic function is normal. Left Atrium: Left atrial size was normal in size. Right Atrium: Right atrial size was normal in size. Pericardium: There is no evidence of pericardial effusion. Mitral Valve: The mitral valve is normal in structure. Mild mitral annular calcification. No evidence of mitral valve regurgitation. No evidence of mitral valve stenosis. Tricuspid Valve: The tricuspid valve is normal in structure. Tricuspid valve regurgitation is not demonstrated. No evidence of tricuspid stenosis. Aortic Valve: The aortic valve is tricuspid. Aortic valve regurgitation is not visualized. Aortic valve sclerosis/calcification is present, without any evidence of aortic stenosis. Aortic valve mean gradient measures 4.0 mmHg. Aortic valve peak gradient measures 6.9 mmHg. Aortic valve area, by VTI measures 3.42 cm. Pulmonic Valve: The pulmonic valve was normal in structure. Pulmonic valve regurgitation is not visualized. No evidence of pulmonic stenosis. Aorta: The aortic root is normal in size and structure. Venous: The inferior vena cava was not well visualized. IAS/Shunts: No atrial level shunt detected by color flow Doppler.  LEFT VENTRICLE PLAX 2D LVIDd:         5.00 cm   Diastology LVIDs:         3.50 cm   LV e' medial:   7.18 cm/s LV PW:         1.40 cm  LV E/e' medial: 11.7 LV IVS:        1.30 cm LVOT diam:     2.25 cm LV SV:         71 LV SV Index:   29 LVOT Area:     3.98 cm  RIGHT VENTRICLE RV S prime:     15.40 cm/s TAPSE (M-mode): 1.8 cm LEFT ATRIUM           Index LA Vol (A2C): 35.4 ml 14.57  ml/m LA Vol (A4C): 33.9 ml 13.95 ml/m  AORTIC VALVE AV Area (Vmax):    3.19 cm AV Area (Vmean):   3.21 cm AV Area (VTI):     3.42 cm AV Vmax:           131.00 cm/s AV Vmean:          85.900 cm/s AV VTI:            0.207 m AV Peak Grad:      6.9 mmHg AV Mean Grad:      4.0 mmHg LVOT Vmax:         105.00 cm/s LVOT Vmean:        69.400 cm/s LVOT VTI:          0.178 m LVOT/AV VTI ratio: 0.86  AORTA Ao Root diam: 3.50 cm MITRAL VALVE MV Area (PHT): 3.45 cm    SHUNTS MV Decel Time: 220 msec    Systemic VTI:  0.18 m MV E velocity: 83.70 cm/s  Systemic Diam: 2.25 cm MV A velocity: 96.70 cm/s MV E/A ratio:  0.87 Armanda Magic MD Electronically signed by Armanda Magic MD Signature Date/Time: 10/31/2023/1:56:40 PM    Final     Scheduled Meds:  carvedilol  12.5 mg Oral BID WC   Chlorhexidine Gluconate Cloth  6 each Topical Daily   diltiazem  10 mg Intravenous Once   enoxaparin (LOVENOX) injection  60 mg Subcutaneous Q24H   feeding supplement  237 mL Oral BID BM   folic acid  1 mg Oral Daily   Gerhardt's butt cream   Topical TID   influenza vac split trivalent PF  0.5 mL Intramuscular Tomorrow-1000   insulin aspart  0-9 Units Subcutaneous Q4H   lactulose  10 g Oral BID   midodrine  5 mg Oral TID WC   multivitamin with minerals  1 tablet Oral Daily   ofloxacin  2 drop Both Eyes QID   spironolactone  25 mg Oral Daily   tamsulosin  0.4 mg Oral Daily   thiamine  100 mg Oral Daily   Continuous Infusions:       LOS: 7 days    Huey Bienenstock, MD [facility]  If 7PM-7AM, please contact night-coverage [email or web address]  11/07/2023, 2:52 PM

## 2023-11-07 NOTE — Plan of Care (Signed)
  Problem: Coping: Goal: Ability to adjust to condition or change in health will improve Outcome: Progressing   Problem: Skin Integrity: Goal: Risk for impaired skin integrity will decrease Outcome: Progressing   Problem: Health Behavior/Discharge Planning: Goal: Ability to manage health-related needs will improve Outcome: Progressing   Problem: Activity: Goal: Risk for activity intolerance will decrease Outcome: Progressing   Problem: Elimination: Goal: Will not experience complications related to bowel motility Outcome: Progressing

## 2023-11-07 NOTE — Progress Notes (Signed)
Inpatient Rehabilitation Admissions Coordinator   No CIR bed to admit him to today.  Ottie Glazier, RN, MSN Rehab Admissions Coordinator [phone] 11/07/2023 11:41 AM

## 2023-11-08 ENCOUNTER — Inpatient Hospital Stay (HOSPITAL_COMMUNITY)
Admission: AD | Admit: 2023-11-08 | Discharge: 2023-11-28 | DRG: 945 | Disposition: A | Discharge status: Home / Self Care | Payer: Medicaid Other | Source: Intra-hospital | Attending: Physical Medicine and Rehabilitation | Admitting: Physical Medicine and Rehabilitation

## 2023-11-08 ENCOUNTER — Inpatient Hospital Stay (HOSPITAL_COMMUNITY): Payer: Medicaid Other

## 2023-11-08 DIAGNOSIS — E119 Type 2 diabetes mellitus without complications: Secondary | ICD-10-CM | POA: Diagnosis present

## 2023-11-08 DIAGNOSIS — F17210 Nicotine dependence, cigarettes, uncomplicated: Secondary | ICD-10-CM | POA: Diagnosis present

## 2023-11-08 DIAGNOSIS — E7220 Disorder of urea cycle metabolism, unspecified: Secondary | ICD-10-CM | POA: Diagnosis present

## 2023-11-08 DIAGNOSIS — G4700 Insomnia, unspecified: Secondary | ICD-10-CM

## 2023-11-08 DIAGNOSIS — K861 Other chronic pancreatitis: Secondary | ICD-10-CM | POA: Diagnosis present

## 2023-11-08 DIAGNOSIS — R159 Full incontinence of feces: Secondary | ICD-10-CM | POA: Diagnosis present

## 2023-11-08 DIAGNOSIS — K7682 Hepatic encephalopathy: Secondary | ICD-10-CM | POA: Diagnosis present

## 2023-11-08 DIAGNOSIS — L89616 Pressure-induced deep tissue damage of right heel: Secondary | ICD-10-CM | POA: Diagnosis present

## 2023-11-08 DIAGNOSIS — I890 Lymphedema, not elsewhere classified: Secondary | ICD-10-CM | POA: Diagnosis present

## 2023-11-08 DIAGNOSIS — K760 Fatty (change of) liver, not elsewhere classified: Secondary | ICD-10-CM | POA: Diagnosis present

## 2023-11-08 DIAGNOSIS — D649 Anemia, unspecified: Secondary | ICD-10-CM | POA: Diagnosis present

## 2023-11-08 DIAGNOSIS — E871 Hypo-osmolality and hyponatremia: Secondary | ICD-10-CM | POA: Diagnosis present

## 2023-11-08 DIAGNOSIS — Z6838 Body mass index (BMI) 38.0-38.9, adult: Secondary | ICD-10-CM

## 2023-11-08 DIAGNOSIS — Z79899 Other long term (current) drug therapy: Secondary | ICD-10-CM

## 2023-11-08 DIAGNOSIS — I10 Essential (primary) hypertension: Secondary | ICD-10-CM | POA: Diagnosis present

## 2023-11-08 DIAGNOSIS — F1010 Alcohol abuse, uncomplicated: Secondary | ICD-10-CM | POA: Diagnosis present

## 2023-11-08 DIAGNOSIS — I959 Hypotension, unspecified: Secondary | ICD-10-CM | POA: Diagnosis present

## 2023-11-08 DIAGNOSIS — R5381 Other malaise: Secondary | ICD-10-CM | POA: Diagnosis present

## 2023-11-08 DIAGNOSIS — G9341 Metabolic encephalopathy: Principal | ICD-10-CM | POA: Diagnosis present

## 2023-11-08 DIAGNOSIS — R32 Unspecified urinary incontinence: Secondary | ICD-10-CM | POA: Diagnosis present

## 2023-11-08 DIAGNOSIS — M1712 Unilateral primary osteoarthritis, left knee: Secondary | ICD-10-CM | POA: Diagnosis present

## 2023-11-08 DIAGNOSIS — E049 Nontoxic goiter, unspecified: Secondary | ICD-10-CM | POA: Diagnosis present

## 2023-11-08 DIAGNOSIS — R4189 Other symptoms and signs involving cognitive functions and awareness: Secondary | ICD-10-CM

## 2023-11-08 DIAGNOSIS — N400 Enlarged prostate without lower urinary tract symptoms: Secondary | ICD-10-CM | POA: Diagnosis present

## 2023-11-08 DIAGNOSIS — K529 Noninfective gastroenteritis and colitis, unspecified: Secondary | ICD-10-CM | POA: Diagnosis present

## 2023-11-08 LAB — BASIC METABOLIC PANEL
Anion gap: 5 (ref 5–15)
BUN: 8 mg/dL (ref 6–20)
CO2: 19 mmol/L — ABNORMAL LOW (ref 22–32)
Calcium: 8.6 mg/dL — ABNORMAL LOW (ref 8.9–10.3)
Chloride: 111 mmol/L (ref 98–111)
Creatinine, Ser: 0.38 mg/dL — ABNORMAL LOW (ref 0.61–1.24)
GFR, Estimated: >60 mL/min (ref >60)
Glucose, Bld: 91 mg/dL (ref 70–99)
Potassium: 4.4 mmol/L (ref 3.5–5.1)
Sodium: 135 mmol/L (ref 135–145)

## 2023-11-08 LAB — GLUCOSE, CAPILLARY
Glucose-Capillary: 102 mg/dL — ABNORMAL HIGH (ref 70–99)
Glucose-Capillary: 112 mg/dL — ABNORMAL HIGH (ref 70–99)
Glucose-Capillary: 120 mg/dL — ABNORMAL HIGH (ref 70–99)
Glucose-Capillary: 125 mg/dL — ABNORMAL HIGH (ref 70–99)
Glucose-Capillary: 127 mg/dL — ABNORMAL HIGH (ref 70–99)
Glucose-Capillary: 127 mg/dL — ABNORMAL HIGH (ref 70–99)
Glucose-Capillary: 242 mg/dL — ABNORMAL HIGH (ref 70–99)

## 2023-11-08 MED ORDER — ENOXAPARIN SODIUM 60 MG/0.6ML IJ SOSY
60.0000 mg | PREFILLED_SYRINGE | INTRAMUSCULAR | Status: DC
  Administered 2023-11-09 – 2023-11-28 (×20): 60 mg via SUBCUTANEOUS
  Filled 2023-11-08 (×20): qty 0.6

## 2023-11-08 MED ORDER — NAPHAZOLINE-GLYCERIN 0.012-0.25 % OP SOLN: 1.0000 [drp] | Freq: Four times a day (QID) | OPHTHALMIC | Status: DC | PRN

## 2023-11-08 MED ORDER — INSULIN ASPART 100 UNIT/ML IJ SOLN
0.0000 [IU] | INTRAMUSCULAR | Status: DC
  Administered 2023-11-08 (×2): 1 [IU] via SUBCUTANEOUS

## 2023-11-08 MED ORDER — LACTULOSE 10 GM/15ML PO SOLN: 5.0000 g | Freq: Two times a day (BID) | ORAL | Status: DC

## 2023-11-08 MED ORDER — TAMSULOSIN HCL 0.4 MG PO CAPS
0.4000 mg | ORAL_CAPSULE | Freq: Every day | ORAL | Status: DC
  Administered 2023-11-09 – 2023-11-10 (×2): 0.4 mg via ORAL
  Filled 2023-11-08 (×3): qty 1

## 2023-11-08 MED ORDER — POLYETHYLENE GLYCOL 3350 17 G PO PACK: 17.0000 g | PACK | Freq: Every day | ORAL | Status: DC | PRN

## 2023-11-08 MED ORDER — ENOXAPARIN SODIUM 60 MG/0.6ML IJ SOSY: 60.0000 mg | PREFILLED_SYRINGE | INTRAMUSCULAR | Status: DC

## 2023-11-08 MED ORDER — CARVEDILOL 12.5 MG PO TABS
12.5000 mg | ORAL_TABLET | Freq: Two times a day (BID) | ORAL | Status: DC
  Administered 2023-11-08 – 2023-11-28 (×40): 12.5 mg via ORAL
  Filled 2023-11-08 (×40): qty 1

## 2023-11-08 MED ORDER — POTASSIUM CHLORIDE CRYS ER 20 MEQ PO TBCR
20.0000 meq | EXTENDED_RELEASE_TABLET | Freq: Every day | ORAL | Status: DC
  Administered 2023-11-09 – 2023-11-28 (×20): 20 meq via ORAL
  Filled 2023-11-08 (×20): qty 1

## 2023-11-08 MED ORDER — GERHARDT'S BUTT CREAM
TOPICAL_CREAM | Freq: Three times a day (TID) | CUTANEOUS | Status: DC
  Filled 2023-11-08 (×3): qty 1

## 2023-11-08 MED ORDER — ENSURE ENLIVE PO LIQD
237.0000 mL | Freq: Two times a day (BID) | ORAL | Status: DC
  Administered 2023-11-09 – 2023-11-28 (×37): 237 mL via ORAL

## 2023-11-08 MED ORDER — IPRATROPIUM-ALBUTEROL 0.5-2.5 (3) MG/3ML IN SOLN: 3.0000 mL | Freq: Four times a day (QID) | RESPIRATORY_TRACT | Status: DC | PRN

## 2023-11-08 MED ORDER — FOLIC ACID 1 MG PO TABS
1.0000 mg | ORAL_TABLET | Freq: Every day | ORAL | Status: DC
  Administered 2023-11-09 – 2023-11-28 (×20): 1 mg via ORAL
  Filled 2023-11-08 (×21): qty 1

## 2023-11-08 MED ORDER — THIAMINE MONONITRATE 100 MG PO TABS
100.0000 mg | ORAL_TABLET | Freq: Every day | ORAL | Status: DC
  Administered 2023-11-09 – 2023-11-28 (×20): 100 mg via ORAL
  Filled 2023-11-08 (×20): qty 1

## 2023-11-08 MED ORDER — ORAL CARE MOUTH RINSE: 15.0000 mL | OROMUCOSAL | Status: DC | PRN

## 2023-11-08 MED ORDER — CARVEDILOL 12.5 MG PO TABS: 12.5000 mg | ORAL_TABLET | Freq: Two times a day (BID) | ORAL | Status: DC

## 2023-11-08 MED ORDER — DICLOFENAC SODIUM 1 % EX GEL
4.0000 g | Freq: Four times a day (QID) | CUTANEOUS | Status: DC
  Administered 2023-11-08 – 2023-11-28 (×66): 4 g via TOPICAL
  Filled 2023-11-08 (×2): qty 100

## 2023-11-08 MED ORDER — ADULT MULTIVITAMIN W/MINERALS CH
1.0000 | ORAL_TABLET | Freq: Every day | ORAL | Status: DC
  Administered 2023-11-09 – 2023-11-28 (×20): 1 via ORAL
  Filled 2023-11-08 (×20): qty 1

## 2023-11-08 MED ORDER — ENSURE ENLIVE PO LIQD: 237.0000 mL | Freq: Two times a day (BID) | ORAL | Status: DC

## 2023-11-08 MED ORDER — MIDODRINE HCL 5 MG PO TABS: 5.0000 mg | ORAL_TABLET | Freq: Two times a day (BID) | ORAL | Status: DC

## 2023-11-08 MED ORDER — LOPERAMIDE HCL 2 MG PO CAPS: 2.0000 mg | ORAL_CAPSULE | Freq: Four times a day (QID) | ORAL | Status: DC | PRN

## 2023-11-08 NOTE — Plan of Care (Signed)
  Problem: Education: Goal: Ability to describe self-care measures that may prevent or decrease complications (Diabetes Survival Skills Education) will improve Outcome: Progressing   Problem: Coping: Goal: Ability to adjust to condition or change in health will improve Outcome: Progressing   Problem: Fluid Volume: Goal: Ability to maintain a balanced intake and output will improve Outcome: Progressing   Problem: Health Behavior/Discharge Planning: Goal: Ability to identify and utilize available resources and services will improve Outcome: Progressing   Problem: Metabolic: Goal: Ability to maintain appropriate glucose levels will improve Outcome: Progressing   Problem: Nutritional: Goal: Maintenance of adequate nutrition will improve Outcome: Progressing   Problem: Skin Integrity: Goal: Risk for impaired skin integrity will decrease Outcome: Progressing   Problem: Tissue Perfusion: Goal: Adequacy of tissue perfusion will improve Outcome: Progressing   

## 2023-11-08 NOTE — TOC CM/SW Note (Signed)
Transition of Care [facility]) - Inpatient Brief Assessment   Patient Details  Name: Brian [name] MRN: [id number] Date of Birth: [birth year]  Transition of Care [facility]) CM/SW Contact:    Mearl Latin, LCSW Phone Number: 11/08/2023, 11:54 AM   Clinical Narrative: Patient admitting to CIR today. No TOC needs identified at this time.    Transition of Care Asessment: Insurance and Status: Insurance coverage has been reviewed Patient has primary care physician: Yes Home environment has been reviewed: From home Prior level of function:: Independent Prior/Current Home Services: No current home services Social Determinants of Health Reivew: SDOH reviewed no interventions necessary Readmission risk has been reviewed: Yes Transition of care needs: no transition of care needs at this time

## 2023-11-08 NOTE — Plan of Care (Signed)
  Problem: Education: Goal: Ability to describe self-care measures that may prevent or decrease complications (Diabetes Survival Skills Education) will improve Outcome: Completed/Met Goal: Individualized Educational Video(s) Outcome: Completed/Met   Problem: Coping: Goal: Ability to adjust to condition or change in health will improve Outcome: Completed/Met   Problem: Fluid Volume: Goal: Ability to maintain a balanced intake and output will improve Outcome: Completed/Met   Problem: Health Behavior/Discharge Planning: Goal: Ability to identify and utilize available resources and services will improve Outcome: Completed/Met Goal: Ability to manage health-related needs will improve Outcome: Completed/Met   Problem: Metabolic: Goal: Ability to maintain appropriate glucose levels will improve Outcome: Completed/Met   Problem: Nutritional: Goal: Maintenance of adequate nutrition will improve Outcome: Completed/Met Goal: Progress toward achieving an optimal weight will improve Outcome: Completed/Met   Problem: Skin Integrity: Goal: Risk for impaired skin integrity will decrease Outcome: Completed/Met   Problem: Tissue Perfusion: Goal: Adequacy of tissue perfusion will improve Outcome: Completed/Met   Problem: Education: Goal: Knowledge of General Education information will improve Description: Including pain rating scale, medication(s)/side effects and non-pharmacologic comfort measures Outcome: Completed/Met   Problem: Health Behavior/Discharge Planning: Goal: Ability to manage health-related needs will improve Outcome: Completed/Met   Problem: Clinical Measurements: Goal: Ability to maintain clinical measurements within normal limits will improve Outcome: Completed/Met Goal: Will remain free from infection Outcome: Completed/Met Goal: Diagnostic test results will improve Outcome: Completed/Met Goal: Respiratory complications will improve Outcome: Completed/Met Goal:  Cardiovascular complication will be avoided Outcome: Completed/Met   Problem: Activity: Goal: Risk for activity intolerance will decrease Outcome: Completed/Met   Problem: Nutrition: Goal: Adequate nutrition will be maintained Outcome: Completed/Met   Problem: Coping: Goal: Level of anxiety will decrease Outcome: Completed/Met   Problem: Elimination: Goal: Will not experience complications related to bowel motility Outcome: Completed/Met Goal: Will not experience complications related to urinary retention Outcome: Completed/Met   Problem: Pain Management: Goal: General experience of comfort will improve Outcome: Completed/Met   Problem: Safety: Goal: Ability to remain free from injury will improve Outcome: Completed/Met   Problem: Skin Integrity: Goal: Risk for impaired skin integrity will decrease Outcome: Completed/Met

## 2023-11-08 NOTE — Progress Notes (Signed)
Inpatient Rehabilitation Admissions Coordinator   CIR bed is available to admit today. I met at bedside with patient and he is in agreement. Acute team and TOC made aware. I will make the arrangements.  Ottie Glazier, RN, MSN Rehab Admissions Coordinator [phone] 11/08/2023 10:21 AM

## 2023-11-08 NOTE — Progress Notes (Signed)
Inpatient Rehabilitation Admission Medication Review by a Pharmacist  A complete drug regimen review was completed for this patient to identify any potential clinically significant medication issues.  High Risk Drug Classes Is patient taking? Indication by Medication  Antipsychotic {Receiving?:26196}   Anticoagulant {Receiving?:26196} Enoxaparin - VTE prophylaxis  Antibiotic {Receiving?:26196}   Opioid {Receiving?:26196}   Antiplatelet {Receiving?:26196}   Hypoglycemics/insulin {Yes or No?:26198} Aspart - hyperglycemia  Vasoactive Medication {Receiving?:26196} Carvedilol, midodrine - tachycardia, HTN  Chemotherapy {Receiving Chemo?:26197}   Other {Yes or No?:26198} Folic acid, MVI, KCl, thiamine - supplement Lactulose - hyperammonemia Tamsulosin - BPH Miralax - constipation Duoneb - wheezing, SOB Loperamide - diarrhea Clear eyes - eye irritation Diclofenac gel - pain       Type of Medication Issue Identified Description of Issue Recommendation(s)  Drug Interaction(s) (clinically significant)     Duplicate Therapy     Allergy     No Medication Administration End Date   Follow-up taper off midodrine  Incorrect Dose     Additional Drug Therapy Needed     Significant med changes from prior encounter (inform family/care partners about these prior to discharge).    Other       Clinically significant medication issues were identified that warrant physician communication and completion of prescribed/recommended actions by midnight of the next day:  {Yes or No?:26198}  Name of provider notified for urgent issues identified:   Provider Method of Notification:     Pharmacist comments:   Time spent performing this drug regimen review (minutes):  ***

## 2023-11-08 NOTE — Progress Notes (Signed)
Horton Chin, MD  Physician Physical Medicine and Rehabilitation   PMR Pre-admission     Signed   Date of Service: 11/04/2023  1:19 PM  Related encounter: ED to Hosp-Admission (Current) from 10/30/2023 in [facility] PCU   Signed     Expand All Collapse All  Show:Clear all [x] Written[x] Templated[x] Copied  Added by: [x] Standley Brooking, RN[x] Theodoro Kos, Lauren P, CCC-SLP[x] Raulkar, Drema Pry, MD  [] Hover for details PMR Admission Coordinator Pre-Admission Assessment   Patient: Brian [name] is an 62 y.o., male MRN: [id number] DOB: December 26, 1960 Height: 6' (182.9 cm) Weight: 124 kg   Insurance Information HMO:     PPO:      PCP:      IPA:      80/20:      OTHER:  PRIMARY: UNINSURED  Gave pt the Self Pay Estimate on 11/06/23    Policy#:       Subscriber:  CM Name:       Phone#:      Fax#:  Pre-Cert#:       Employer:  Benefits:  Phone #:      Name:  Eff. Date:      Deduct:       Out of Pocket Max:       Life Max:  CIR:       SNF:  Outpatient:      Co-Pay:  Home Health:       Co-Pay:  DME:      Co-Pay:  Providers:  SECONDARY:       Policy#:      Phone#:    Financial Counselor: Livingston Diones               Phone#: [phone]   The "Data Collection Information Summary" for patients in Inpatient Rehabilitation Facilities with attached "Privacy Act Statement-Health Care Records" was provided and verbally reviewed with: N/A   Emergency Contact Information Contact Information       Name Relation Home Work Mobile    Ford,Kimberely Spouse     [phone]         Other Contacts   None on File        Current Medical History  Patient Admitting Diagnosis: debility   History of Present Illness:  62 year old right-handed male with history of obesity BMI 38.8, fatty liver, alcohol abuse with alcoholic pancreatitis,chronic lymphedema and hypertension as well as tobacco use.  .  Patient was able to ambulate short distances around the  house with a rolling walker sometimes needed.  Wheelchair for community distances.  Independent with basic ADLs.  Presented 10/30/2023 after he was found by his wife on the couch in his own urine and feces with altered mental status.     With EMS he was noted to be tachycardic and warm to touch.  His oxygen saturation are 87%.  He did receive 4 mg of IV Ativan due to concerns for possible DT's.  CT/MRI of the brain showed no acute reversible findings.  CT of the chest abdomen pelvis findings concerning for potential acute appendicitis given the dilated inflamed appearing appendix containing multiple appendicitis.  Diffuse dilatation of the small bowel and colon suggesting possible ileus.  No definite acute findings in the thorax.  Hepatomegaly with severe hepatic steatosis.  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  Chemistries blood cultures no growth to date, potassium 3.0, glucose 152, BUN 25, AST 44, total bilirubin 9.4,  troponin negative, lipase 35, alcohol negative, hemoglobin A1c 5.8, ammonia level 42, lactic acid 3.8-0.8.  General surgery was consulted for low suspicion of appendicitis and advised to continue to monitor.  He did remain on empiric antibiotics for short time.  Placed on lactulose for elevated ammonia levels and continued to be monitored.  Blood pressures remained a bit soft maintained on ProAmatine.  Lovenox added for DVT prophylaxis.  Mechanical soft diet with thin liquids.    Patient's [facility] has been reviewed by the rehabilitation admission coordinator and physician.   Past Medical History      Past Medical History:  Diagnosis Date   Alcoholic pancreatitis     ETOH abuse     HTN (hypertension)     Hypertension     Morbid obesity (HCC)          Has the patient had major surgery during 100 days prior to admission? No   Family History   family history is not on file.   Current  Medications  Current Medications    Current Facility-Administered Medications:    carvedilol (COREG) tablet 12.5 mg, 12.5 mg, Oral, BID WC, Elgergawy, Leana Roe, MD, 12.5 mg at 11/08/23 0907   Chlorhexidine Gluconate Cloth 2 % PADS 6 each, 6 each, Topical, Daily, Albustami, Flonnie Hailstone, MD, 6 each at 11/08/23 0907   diltiazem (CARDIZEM) injection 10 mg, 10 mg, Intravenous, Once, Elgergawy, Leana Roe, MD   enoxaparin (LOVENOX) injection 60 mg, 60 mg, Subcutaneous, Q24H, Calton Dach I, RPH, 60 mg at 11/08/23 4098   feeding supplement (ENSURE ENLIVE / ENSURE PLUS) liquid 237 mL, 237 mL, Oral, BID BM, Elgergawy, Leana Roe, MD, 237 mL at 11/08/23 0907   folic acid (FOLVITE) tablet 1 mg, 1 mg, Oral, Daily, Elgergawy, Leana Roe, MD, 1 mg at 11/08/23 1191   Gerhardt's butt cream, , Topical, TID, Elgergawy, Leana Roe, MD, Given at 11/08/23 0907   influenza vac split trivalent PF (FLULAVAL) injection 0.5 mL, 0.5 mL, Intramuscular, Tomorrow-1000, Azucena Fallen, MD   insulin aspart (novoLOG) injection 0-9 Units, 0-9 Units, Subcutaneous, Q4H, Albustami, Flonnie Hailstone, MD, 3 Units at 11/08/23 0907   ipratropium-albuterol (DUONEB) 0.5-2.5 (3) MG/3ML nebulizer solution 3 mL, 3 mL, Nebulization, Q6H PRN, Albustami, Flonnie Hailstone, MD   lactulose (CHRONULAC) 10 GM/15ML solution 10 g, 10 g, Oral, BID, Elgergawy, Leana Roe, MD, 10 g at 11/08/23 4782   loperamide (IMODIUM) capsule 2 mg, 2 mg, Oral, Q6H PRN, Elgergawy, Leana Roe, MD, 2 mg at 11/08/23 0014   LORazepam (ATIVAN) injection 1 mg, 1 mg, Intravenous, Q4H PRN, Calton Dach I, RPH   midodrine (PROAMATINE) tablet 10 mg, 10 mg, Oral, TID WC, Elgergawy, Leana Roe, MD, 10 mg at 11/08/23 9562   multivitamin with minerals tablet 1 tablet, 1 tablet, Oral, Daily, Elgergawy, Leana Roe, MD, 1 tablet at 11/08/23 1308   naphazoline-glycerin (CLEAR EYES REDNESS) ophth solution 1-2 drop, 1-2 drop, Both Eyes, QID PRN, Rocky Morel, DO   Oral care mouth rinse, 15 mL, Mouth Rinse,  PRN, Albustami, Flonnie Hailstone, MD   polyethylene glycol (MIRALAX / GLYCOLAX) packet 17 g, 17 g, Oral, Daily PRN, Elgergawy, Leana Roe, MD   potassium chloride SA (KLOR-CON M) CR tablet 20 mEq, 20 mEq, Oral, Daily, Elgergawy, Leana Roe, MD, 20 mEq at 11/08/23 0906   tamsulosin (FLOMAX) capsule 0.4 mg, 0.4 mg, Oral, Daily, Azucena Fallen, MD, 0.4 mg at 11/08/23 6578   thiamine (VITAMIN B1) tablet 100 mg, 100  mg, Oral, Daily, Elgergawy, Leana Roe, MD, 100 mg at 11/08/23 4098     Patients Current Diet:  Diet Order                  DIET DYS 3 Room service appropriate? Yes; Fluid consistency: Thin  Diet effective now                       Precautions / Restrictions Precautions Precautions: Fall Precaution Comments: Watch HR. Bowel incontinence Restrictions Weight Bearing Restrictions: No    Has the patient had 2 or more falls or a fall with injury in the past year? Yes   Prior Activity Level Limited Community (1-2x/wk): doctors' appointments   Prior Functional Level Self Care: Did the patient need help bathing, dressing, using the toilet or eating? Independent   Indoor Mobility: Did the patient need assistance with walking from room to room (with or without device)? Independent   Stairs: Did the patient need assistance with internal or external stairs (with or without device)? Dependent (stopped going up stairs at house)   Functional Cognition: Did the patient need help planning regular tasks such as shopping or remembering to take medications? Needed some help   Patient Information Are you of Hispanic, Latino/a,or Spanish origin?: A. No, not of Hispanic, Latino/a, or Spanish origin What is your race?: C. American [place] or [place] Native Do you need or want an interpreter to communicate with a doctor or health care staff?: 0. No   Patient's Response To:  Health Literacy and Transportation Is the patient able to respond to health literacy and transportation needs?: Yes Health  Literacy - How often do you need to have someone help you when you read instructions, pamphlets, or other written material from your doctor or pharmacy?: Never In the past 12 months, has lack of transportation kept you from medical appointments or from getting medications?: Yes In the past 12 months, has lack of transportation kept you from meetings, work, or from getting things needed for daily living?: Yes   Home Assistive Devices / Equipment Home Equipment: Agricultural consultant (2 wheels), Rollator (4 wheels), Cane - single point, Hand held shower head   Prior Device Use: Indicate devices/aids used by the patient prior to current illness, exacerbation or injury? Walker   Current Functional Level Cognition   Overall Cognitive Status: Within Functional Limits for tasks assessed Current Attention Level: Selective Orientation Level: Oriented to person, Oriented to place, Oriented to situation Safety/Judgement: Decreased awareness of safety, Decreased awareness of deficits General Comments: continues to be [facility], limited insight. Pt soiled upon arrival, he reports he called out over an hour ago without assist. Educated pt on importance or hygiene for skin integrity and to call out again if staff does not assist in a timely way.    Extremity Assessment (includes Sensation/Coordination)   Upper Extremity Assessment: Generalized weakness  Lower Extremity Assessment: Defer to PT evaluation     ADLs   Overall ADL's : Needs assistance/impaired Eating/Feeding: Independent Grooming: Set up, Sitting Upper Body Bathing: Set up, Sitting Lower Body Bathing: Maximal assistance, Bed level Upper Body Dressing : Set up, Sitting Lower Body Dressing: Total assistance Toilet Transfer: Total assistance Toilet Transfer Details (indicate cue type and reason): soiled upon arrival Toileting- Clothing Manipulation and Hygiene: Total assistance, +2 for physical assistance, +2 for safety/equipment, Bed level Toileting -  Clothing Manipulation Details (indicate cue type and reason): total A for peri care - multiple bleeding wounds on his  bottom. Functional mobility during ADLs: Minimal assistance (at bed level) General ADL Comments: limited by incontinence     Mobility   Overal bed mobility: Needs Assistance Bed Mobility: Rolling Rolling: Min assist Supine to sit: Contact guard, Used rails, HOB elevated Sit to supine: Min assist, HOB elevated General bed mobility comments: several rolls L&R to assist with peri care and linen change     Transfers   Overall transfer level: Needs assistance Equipment used: Rolling walker (2 wheels) Transfers: Sit to/from Stand Sit to Stand: +2 physical assistance, Max assist Bed to/from chair/wheelchair/BSC transfer type:: Via Lift equipment General transfer comment: pt able to stand with bari RW with +2 Max A however unable to come fully upright and noted to have BM upon standing along with HR increasing to 156. RN made aware     Ambulation / Gait / Stairs / Wheelchair Mobility   Ambulation/Gait General Gait Details: unable     Posture / Balance Dynamic Sitting Balance Sitting balance - Comments: able to correct when balance is challenged Balance Overall balance assessment: Needs assistance Sitting-balance support: Feet supported Sitting balance-Leahy Scale: Good Sitting balance - Comments: able to correct when balance is challenged Standing balance support: Bilateral upper extremity supported Standing balance-Leahy Scale: Poor Standing balance comment: Reliant on external support.     Special needs/care consideration     Bullins, Benard Halsted, RN  Registered Nurse WOC   Consult Note     Addendum   Date of Service: 11/02/2023  7:17 PM     Show:Clear all [x] Written[x] Templated[] Copied   Added by: [x] Bullins, Benard Halsted, RN   [] Hover for details WOC Nurse Consult Note: Reason for Consult: excoriation of buttocks  Wound type: Moisture Associated Skin  Damage  ICD-10 CM Codes for Irritant Dermatitis   L24A2 - Due to fecal, urinary or dual incontinence Pressure Injury POA: Yes/No/NA Measurement: widespread buttocks and upper posterior thighs  Wound bed: erythema, partial thickness skin loss  Drainage (amount, consistency, odor)  Periwound: erythema  Dressing procedure/placement/frequency: Cleanse buttocks/upper posterior thighs with Vashe wound cleanser Hart [place] [phone]), apply Gerhardt's Butt Cream to entire area 3 times a day and prn soiling.  Sprinkle over Gerhardt's with floor stock antifungal powder (microguard green and white label).    Patient would benefit from low air loss mattress for moisture management.    POC discussed with bedside nurse. WOC team will not follow. Re-consult if further needs arise.    Thank you,      Priscella Mann MSN, RN-BC, [facility] [phone]            Revision History                                                                             Low air loss bed in use     Previous Home Environment Living Arrangements: Spouse/significant other, Children  Lives With: Spouse, Son Available Help at Discharge: Family, Available 24 hours/day Type of Home: House Home Layout: 1/2 bath on main level, Two level, Other (Comment) (sleeping on couch or on recliner) Alternate Level Stairs-Rails: Can reach both Alternate Level Stairs-Number of Steps: flight Home Access: Stairs to enter Entrance Stairs-Rails: None Entrance Stairs-Number of Steps: 1 Bathroom Shower/Tub: Sponge  bathes at baseline Bathroom Toilet: Standard Bathroom Accessibility: Yes How Accessible: Accessible via walker Home Care Services: No   Discharge Living Setting Plans for Discharge Living Setting: Patient's home Type of Home at Discharge: House Discharge Home Layout: 1/2 bath on main level, Two level, Other (Comment) (sleeping on couch or in recliner) Alternate Level Stairs-Rails: Can reach both Alternate Level  Stairs-Number of Steps: flight Discharge Home Access: Stairs to enter Entrance Stairs-Rails: None Entrance Stairs-Number of Steps: 1 Discharge Bathroom Shower/Tub: Tub/shower unit, Other (comment) (taking sponge baths since wasn't going up stairs at house) Discharge Bathroom Toilet: Standard Discharge Bathroom Accessibility: Yes How Accessible: Accessible via walker Does the patient have any problems obtaining your medications?: Yes (Describe) (expensive)   Social/Family/Support Systems Anticipated Caregiver: Irven Easterly, wife Anticipated Industrial/product designer Information: [phone] Discharge Plan Discussed with Primary Caregiver: Yes Is Caregiver In Agreement with Plan?: Yes Does Caregiver/Family have Issues with Lodging/Transportation while Pt is in Rehab?: No   Goals Patient/Family Goal for Rehab: Min A:PT/OT Expected length of stay: 14-16 days Pt/Family Agrees to Admission and willing to participate: Yes Program Orientation Provided & Reviewed with Pt/Caregiver Including Roles  & Responsibilities: Yes   Decrease burden of Care through IP rehab admission: NA   Possible need for SNF placement upon discharge: Not anticipated   Patient Condition: I have reviewed medical records from [facility], spoken with CM, and patient and spouse. I met with patient at the bedside and discussed via phone for inpatient rehabilitation assessment.  Patient will benefit from ongoing PT and OT, can actively participate in 3 hours of therapy a day 5 days of the week, and can make measurable gains during the admission.  Patient will also benefit from the coordinated team approach during an Inpatient Acute Rehabilitation admission.  The patient will receive intensive therapy as well as Rehabilitation physician, nursing, social worker, and care management interventions.  Due to bladder management, bowel management, safety, skin/wound care, disease management, medication administration, pain management,  and patient education the patient requires 24 hour a day rehabilitation nursing.  The patient is currently max assist overall with mobility and basic ADLs.  Discharge setting and therapy post discharge at home with home health is anticipated.  Patient has agreed to participate in the Acute Inpatient Rehabilitation Program and will admit today.   Preadmission Screen Completed By:  Domingo Pulse, 11/08/2023 10:23 AM ______________________________________________________________________   Discussed status with Dr. Carlis Abbott  on 11/08/23 at 1026 and received approval for admission today.   Admission Coordinator:  Domingo Pulse, CCC-SLP, time 1026 Date 11/08/23    Assessment/Plan: Diagnosis: Debility 2/2 chronic alcoholism Does the need for close, 24 hr/day Medical supervision in concert with the patient's rehab needs make it unreasonable for this patient to be served in a less intensive setting? Yes Co-Morbidities requiring supervision/potential complications: alcoholic pancreatitis, ETOH abuse, HTN, morbid obesity, tachycardia, hepatomegaly, severe hepatic steatosis Due to bladder management, bowel management, safety, skin/wound care, disease management, medication administration, pain management, and patient education, does the patient require 24 hr/day rehab nursing? Yes Does the patient require coordinated care of a physician, rehab nurse, PT, OT, and SLP to address physical and functional deficits in the context of the above medical diagnosis(es)? Yes Addressing deficits in the following areas: balance, endurance, locomotion, strength, transferring, bowel/bladder control, bathing, dressing, feeding, grooming, toileting, cognition, and psychosocial support Can the patient actively participate in an intensive therapy program of at least 3 hrs of therapy 5 days a week? Yes The  potential for patient to make measurable gains while on inpatient rehab is excellent Anticipated functional  outcomes upon discharge from inpatient rehab: supervision PT, supervision OT, supervision SLP Estimated rehab length of stay to reach the above functional goals is: 10-14 days Anticipated discharge destination: Home 10. Overall Rehab/Functional Prognosis: excellent     MD Signature: Sula Soda, MD          Revision History

## 2023-11-08 NOTE — Discharge Summary (Signed)
PATIENT DETAILS Name: Brian [name] Age: 62 y.o. Sex: male Date of Birth: Nov 02, 1961 MRN: [id number]. Admitting Physician: Patrici Ranks, MD [facility], No Pcp Per  Admit Date: 10/30/2023 Discharge date: 11/08/2023  Recommendations for Outpatient Follow-up:  Follow up with PCP in 1-2 weeks Please obtain CMP/CBC in one week Taper off midodrine over the next several days  Admitted From:  Home  Disposition: Rehabilitation facility   Discharge Condition: good  CODE STATUS:   Code Status: Full Code   Diet recommendation:  Diet Order             Diet - low sodium heart healthy           DIET DYS 3 Room service appropriate? Yes; Fluid consistency: Thin  Diet effective now                    Brief Summary: 62 year old with chronic lower extremity lymphedema, HTN, EtOH use-presented with AMS-was found down at home-usually admitted to the ICU-stabilized and transferred to [facility] on 11/13.  Brief Hospital Course: Acute metabolic encephalopathy Felt to be due to combination of hyperammonemia/possible alcohol withdrawal Neuroimaging negative EEG stable-no seizures Now completely awake and alert with supportive care  Alcohol withdrawal Improved Completed phenobarbital taper  Hyperammonemia No obvious cirrhotic liver seen on CT abdomen Continue lactulose for now Further workup deferred to the outpatient setting-severe hepatic steatosis seen on CT abdomen.  Appendicoliths Evaluated by surgery-not felt to have appendicitis-ruled out Supportive care  BPH Flomax  Sick thyroid syndrome versus subclinical hypothyroidism Repeat thyroid function test in 4 to 6 weeks.  Masslike enlargement of left thyroid gland Needs outpatient FNA/workup-this does not appear to be a new finding  Thrombocytopenia Improved Follow CBC  Chronic lower extremity lymphedema Stable  HTN BP stable Decrease midodrine and taper off over the next several days  Possible  OSA Sleep study as outpatient  Moisture associated skin damage-bilateral buttocks Wound care  Nutrition Status: Nutrition Problem: Increased nutrient needs Etiology: chronic illness Signs/Symptoms: estimated needs Interventions: Ensure Enlive (each supplement provides 350kcal and 20 grams of protein), MVI   Obesity: Estimated body mass index is 37.08 kg/m as calculated from the following:   Height as of this encounter: 6' (1.829 m).   Weight as of this encounter: 124 kg.   Discharge Diagnoses:  Active Problems:   Hepatic encephalopathy [facility])   Discharge Instructions:  Activity:  As tolerated    Discharge Instructions     Diet - low sodium heart healthy   Complete by: As directed    Discharge instructions   Complete by: As directed    Follow with Primary MD in 1-2 weeks  Please get a complete blood count and chemistry panel checked by your Primary MD at your next visit, and again as instructed by your Primary MD.  Get Medicines reviewed and adjusted: Please take all your medications with you for your next visit with your Primary MD  Laboratory/radiological data: Please request your Primary MD to go over all hospital tests and procedure/radiological results at the follow up, please ask your Primary MD to get all Hospital records sent to his/her office.  In some cases, they will be blood work, cultures and biopsy results pending at the time of your discharge. Please request that your primary care M.D. follows up on these results.  Also Note the following: If you experience worsening of your admission symptoms, develop shortness of breath, life threatening emergency, suicidal or homicidal thoughts you must seek  medical attention immediately by calling 911 or calling your MD immediately  if symptoms less severe.  You must read complete instructions/literature along with all the possible adverse reactions/side effects for all the Medicines you take and that have been  prescribed to you. Take any new Medicines after you have completely understood and accpet all the possible adverse reactions/side effects.   Do not drive when taking Pain medications or sleeping medications (Benzodaizepines)  Do not take more than prescribed Pain, Sleep and Anxiety Medications. It is not advisable to combine anxiety,sleep and pain medications without talking with your primary care practitioner  Special Instructions: If you have smoked or chewed Tobacco  in the last 2 yrs please stop smoking, stop any regular Alcohol  and or any Recreational drug use.  Wear Seat belts while driving.  Please note: You were cared for by a hospitalist during your hospital stay. Once you are discharged, your primary care physician will handle any further medical issues. Please note that NO REFILLS for any discharge medications will be authorized once you are discharged, as it is imperative that you return to your primary care physician (or establish a relationship with a primary care physician if you do not have one) for your post hospital discharge needs so that they can reassess your need for medications and monitor your lab values.   Discharge wound care:   Complete by: As directed    Cleanse buttocks/upper posterior thighs with Vashe wound care cleanser Hart [place] [phone]), apply Gerhardt's Butt Cream to entire area 3 times a day and prn soiling.  Sprinkle over Gerhardt's with floor stock antifungal powder (microguard green and white label).   Increase activity slowly   Complete by: As directed       Allergies as of 11/08/2023   No Known Allergies      Medication List     STOP taking these medications    amLODipine 10 MG tablet Commonly known as: NORVASC       TAKE these medications    carvedilol 12.5 MG tablet Commonly known as: COREG Take 1 tablet (12.5 mg total) by mouth 2 (two) times daily with a meal.   feeding supplement Liqd Take 237 mLs by mouth 2 (two) times daily  between meals.   Fish Oil 1000 MG Caps Take 2,000 mg by mouth daily.   lactulose 10 GM/15ML solution Commonly known as: CHRONULAC Take 7.5 mLs (5 g total) by mouth 2 (two) times daily.   midodrine 5 MG tablet Commonly known as: PROAMATINE Take 1 tablet (5 mg total) by mouth 2 (two) times daily with a meal.   multivitamin with minerals tablet Take 1 tablet by mouth daily.   thiamine 100 MG tablet Commonly known as: Vitamin B-1 Take 1 tablet (100 mg total) by mouth daily.               Discharge Care Instructions  (From admission, onward)           Start     Ordered   11/08/23 0000  Discharge wound care:       Comments: Cleanse buttocks/upper posterior thighs with Vashe wound care cleanser Hart [place] [phone]), apply Gerhardt's Butt Cream to entire area 3 times a day and prn soiling.  Sprinkle over Gerhardt's with floor stock antifungal powder (microguard green and white label).   11/08/23 1048            No Known Allergies   Other Procedures/Studies: MR BRAIN WO CONTRAST  Result Date: 10/31/2023  CLINICAL DATA:  Mental status change of unknown cause. Difficulty holding still. EXAM: MRI HEAD WITHOUT CONTRAST TECHNIQUE: Multiplanar, multiecho pulse sequences of the brain and surrounding structures were obtained without intravenous contrast. COMPARISON:  Head CT earlier same day FINDINGS: Brain: The study suffers from motion degradation. No diffusion abnormality is identified, though motion degradation could obscure a subtle insult. Brain otherwise appears similarly normal without evidence of stroke, mass, hemorrhage, hydrocephalus or extra-axial collection. Because of the motion, a subtle lesion could be inapparent. Vascular: Major vessels at the base of the brain show flow. Skull and upper cervical spine: Negative Sinuses/Orbits: Sinuses are clear except for a small amount of fluid layering in the sphenoid sinus. Orbits negative. Other: None IMPRESSION: 1. Motion  degraded study. No acute or reversible finding. Because of the motion, a subtle lesion could be inapparent. 2. Small amount of fluid layering in the sphenoid sinus. Electronically Signed   By: Paulina Fusi M.D.   On: 10/31/2023 17:19   DG Abd 1 View  Result Date: 10/31/2023 CLINICAL DATA:  NG tube placement EXAM: ABDOMEN - 1 VIEW COMPARISON:  CT 10/31/2023 FINDINGS: Esophageal tube tip overlies the gastric fundus. Air distended large and small bowel in the upper abdomen. IMPRESSION: Esophageal tube tip overlies the gastric fundus. Electronically Signed   By: Jasmine Pang M.D.   On: 10/31/2023 16:27   ECHOCARDIOGRAM COMPLETE  Result Date: 10/31/2023    ECHOCARDIOGRAM REPORT   Patient Name:   Brian [name] Date of Exam: 10/31/2023 Medical Rec #:  [id number]       Height:       72.0 in Accession #:    [id number]      Weight:       272.5 lb Date of Birth:  Apr 19, 1961      BSA:          2.430 m Patient Age:    62 years        BP:           139/80 mmHg Patient Gender: M               HR:           94 bpm. Exam Location:  Inpatient Procedure: 2D Echo, Cardiac Doppler, Color Doppler and Intracardiac            Opacification Agent Indications:    Abnormal ECG R94.31  History:        Patient has no prior history of Echocardiogram examinations.                 Risk Factors:Hypertension.  Sonographer:    Darlys Gales Referring Phys: [id number] OMAR M ALBUSTAMI IMPRESSIONS  1. Left ventricular ejection fraction, by estimation, is 60 to 65%. The left ventricle has normal function. The left ventricle has no regional wall motion abnormalities. There is mild concentric left ventricular hypertrophy. Left ventricular diastolic function could not be evaluated.  2. Right ventricular systolic function is normal. The right ventricular size is normal.  3. The mitral valve is normal in structure. No evidence of mitral valve regurgitation. No evidence of mitral stenosis.  4. The aortic valve is tricuspid. Aortic valve regurgitation  is not visualized. Aortic valve sclerosis/calcification is present, without any evidence of aortic stenosis. Aortic valve area, by VTI measures 3.42 cm. Aortic valve mean gradient measures 4.0 mmHg. Aortic valve Vmax measures 1.31 m/s. FINDINGS  Left Ventricle: Left ventricular ejection fraction, by estimation, is 60 to 65%. The left ventricle has  normal function. The left ventricle has no regional wall motion abnormalities. The left ventricular internal cavity size was normal in size. There is  mild concentric left ventricular hypertrophy. Left ventricular diastolic function could not be evaluated. Right Ventricle: The right ventricular size is normal. No increase in right ventricular wall thickness. Right ventricular systolic function is normal. Left Atrium: Left atrial size was normal in size. Right Atrium: Right atrial size was normal in size. Pericardium: There is no evidence of pericardial effusion. Mitral Valve: The mitral valve is normal in structure. Mild mitral annular calcification. No evidence of mitral valve regurgitation. No evidence of mitral valve stenosis. Tricuspid Valve: The tricuspid valve is normal in structure. Tricuspid valve regurgitation is not demonstrated. No evidence of tricuspid stenosis. Aortic Valve: The aortic valve is tricuspid. Aortic valve regurgitation is not visualized. Aortic valve sclerosis/calcification is present, without any evidence of aortic stenosis. Aortic valve mean gradient measures 4.0 mmHg. Aortic valve peak gradient measures 6.9 mmHg. Aortic valve area, by VTI measures 3.42 cm. Pulmonic Valve: The pulmonic valve was normal in structure. Pulmonic valve regurgitation is not visualized. No evidence of pulmonic stenosis. Aorta: The aortic root is normal in size and structure. Venous: The inferior vena cava was not well visualized. IAS/Shunts: No atrial level shunt detected by color flow Doppler.  LEFT VENTRICLE PLAX 2D LVIDd:         5.00 cm   Diastology LVIDs:          3.50 cm   LV e' medial:   7.18 cm/s LV PW:         1.40 cm   LV E/e' medial: 11.7 LV IVS:        1.30 cm LVOT diam:     2.25 cm LV SV:         71 LV SV Index:   29 LVOT Area:     3.98 cm  RIGHT VENTRICLE RV S prime:     15.40 cm/s TAPSE (M-mode): 1.8 cm LEFT ATRIUM           Index LA Vol (A2C): 35.4 ml 14.57 ml/m LA Vol (A4C): 33.9 ml 13.95 ml/m  AORTIC VALVE AV Area (Vmax):    3.19 cm AV Area (Vmean):   3.21 cm AV Area (VTI):     3.42 cm AV Vmax:           131.00 cm/s AV Vmean:          85.900 cm/s AV VTI:            0.207 m AV Peak Grad:      6.9 mmHg AV Mean Grad:      4.0 mmHg LVOT Vmax:         105.00 cm/s LVOT Vmean:        69.400 cm/s LVOT VTI:          0.178 m LVOT/AV VTI ratio: 0.86  AORTA Ao Root diam: 3.50 cm MITRAL VALVE MV Area (PHT): 3.45 cm    SHUNTS MV Decel Time: 220 msec    Systemic VTI:  0.18 m MV E velocity: 83.70 cm/s  Systemic Diam: 2.25 cm MV A velocity: 96.70 cm/s MV E/A ratio:  0.87 Armanda Magic MD Electronically signed by Armanda Magic MD Signature Date/Time: 10/31/2023/1:56:40 PM    Final    EEG adult  Result Date: 10/31/2023 Charlsie Quest, MD     10/31/2023 12:10 PM Patient Name: Brian [name] MRN: [id number] Epilepsy Attending: Charlsie Quest Referring Physician/Provider:  Rocky Morel, DO Date: 10/31/2023 Duration: 23.56 mins Patient history: 62yo M with ams getting eeg to evaluate for seizure Level of alertness: Awake/ lethargic AEDs during EEG study: Ativan Technical aspects: This EEG study was done with scalp electrodes positioned according to the 10-20 International system of electrode placement. Electrical activity was reviewed with band pass filter of 1-70Hz , sensitivity of 7 uV/mm, display speed of 80mm/sec with a 60Hz  notched filter applied as appropriate. EEG data were recorded continuously and digitally stored.  Video monitoring was available and reviewed as appropriate. Description: No posterior dominant rhythm was noted. EEG showed continuous generalized  3 to 5 Hz theta-delta slowing, at times with triphasic morphology. Hyperventilation and photic stimulation were not performed.   ABNORMALITY - Continuous slow, generalized IMPRESSION: This study is suggestive of moderate to severe diffuse encephalopathy. No seizures or epileptiform discharges were seen throughout the recording. Charlsie Quest   CT CHEST ABDOMEN PELVIS W CONTRAST  Result Date: 10/31/2023 CLINICAL DATA:  62 year old male with history of sepsis. EXAM: CT CHEST, ABDOMEN, AND PELVIS WITH CONTRAST TECHNIQUE: Multidetector CT imaging of the chest, abdomen and pelvis was performed following the standard protocol during bolus administration of intravenous contrast. RADIATION DOSE REDUCTION: This exam was performed according to the departmental dose-optimization program which includes automated exposure control, adjustment of the mA and/or kV according to patient size and/or use of iterative reconstruction technique. CONTRAST:  ISOVUE-370 IOPAMIDOL (ISOVUE-370) INJECTION 76% COMPARISON:  CT of the chest, abdomen and pelvis 03/31/2023. FINDINGS: CT CHEST FINDINGS Cardiovascular: Heart size is mildly enlarged. There is no significant pericardial fluid, thickening or pericardial calcification. There is aortic atherosclerosis, as well as atherosclerosis of the great vessels of the mediastinum and the coronary arteries, including calcified atherosclerotic plaque in the left anterior descending and right coronary arteries. Mediastinum/Nodes: No pathologically enlarged mediastinal or hilar lymph nodes. Mass-like enlargement and heterogeneous enhancement of the left lobe of the thyroid gland which measures at least 4.0 x 4.5 cm (axial image 5 of series 3), similar to the prior study. Esophagus is unremarkable in appearance. No axillary lymphadenopathy. Lungs/Pleura: Elevation of the right hemidiaphragm. Extensive linear architectural distortion throughout the lung bases bilaterally, increased compared to  the prior study, likely combination of progressive chronic scarring and worsening areas of atelectasis. No confluent consolidative airspace disease. No pleural effusions. No definite suspicious appearing pulmonary nodules or masses confidently identified on today's motion limited examination. Musculoskeletal: There are no aggressive appearing lytic or blastic lesions noted in the visualized portions of the skeleton. CT ABDOMEN PELVIS FINDINGS Hepatobiliary: The liver is enlarged measuring 21.4 cm in craniocaudal span. Diffuse low attenuation throughout the hepatic parenchyma, indicative of a background of hepatic steatosis. No definite suspicious hepatic lesions. No intra or extrahepatic biliary ductal dilatation. Numerous partially calcified gallstones are noted within the lumen of the gallbladder. Gallbladder is completely decompressed around these indwelling stones. No definite pericholecystic fluid or focal surrounding inflammatory changes. Pancreas: No pancreatic mass. No pancreatic ductal dilatation. No pancreatic or peripancreatic fluid collections or inflammatory changes. Spleen: Unremarkable. Adrenals/Urinary Tract: Nonobstructive calculi in the right renal collecting system measuring up to 5 mm in the interpolar region. Left kidney and bilateral adrenal glands are otherwise normal in appearance. No hydroureteronephrosis. Urinary bladder is unremarkable in appearance. Stomach/Bowel: The appearance of the stomach is normal. Colon is diffusely distended with a large volume of liquid stool. Multiple dilated loops of small bowel are also evident measuring up to 4 cm in diameter, with multiple air-fluid levels in the  small bowel. Several colonic diverticuli are noted, without definite focal surrounding inflammatory changes to clearly indicate an acute diverticulitis at this time. The appendix appears dilated, with a thickened mildly inflamed wall and multiple appendicoliths concerning for possible acute  appendicitis. The appendix measures up to 14 mm in diameter, and is located posterior to the cecum in the right lower quadrant best appreciated on axial image 78 of series 3. Vascular/Lymphatic: Atherosclerotic calcifications in the abdominal aorta and pelvic vasculature. Numerous prominent borderline enlarged retroperitoneal and upper abdominal lymph nodes are noted, nonspecific, but presumably reactive. Reproductive: Prostate gland and seminal vesicles are unremarkable in appearance. Other: Left inguinal hernia containing only fat. No significant volume of ascites. No pneumoperitoneum. Musculoskeletal: There are no aggressive appearing lytic or blastic lesions noted in the visualized portions of the skeleton. IMPRESSION: 1. Findings are concerning for potential acute appendicitis given the dilated inflamed appearing appendix containing multiple appendicoliths. 2. Diffuse dilatation of the small bowel and colon, which may suggest an associated ileus, or could be indicative of enteritis/colitis. 3. No definite acute findings in the thorax. 4. Hepatomegaly with severe hepatic steatosis. 5. Aortic atherosclerosis, in addition to left anterior descending and right coronary artery disease. Please note that although the presence of coronary artery calcium documents the presence of coronary artery disease, the severity of this disease and any potential stenosis cannot be assessed on this non-gated CT examination. Assessment for potential risk factor modification, dietary therapy or pharmacologic therapy may be warranted, if clinically indicated. 6. 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 Recommend thyroid US (ref: J Am Coll Radiol. 2015 Feb;12(2): 143-50). 7. Additional incidental findings, as above. Electronically Signed   By: Trudie Reed M.D.   On: 10/31/2023 07:21   DG Chest Portable 1 View  Result Date: 10/31/2023 CLINICAL DATA:  Possible sepsis.  EXAM: PORTABLE CHEST 1 VIEW COMPARISON:  March 31, 2023 FINDINGS: The heart size and mediastinal contours are within normal limits. Low lung volumes are noted with mild elevation of the right hemidiaphragm. Mild atelectasis is suspected within the right lung base. No pleural effusion or pneumothorax is identified. Multilevel degenerative changes are seen throughout the thoracic spine. IMPRESSION: Low lung volumes with mild right basilar atelectasis. Electronically Signed   By: Aram Candela M.D.   On: 10/31/2023 04:34   CT Head Wo Contrast  Result Date: 10/31/2023 CLINICAL DATA:  Mental status change with unknown cause. Suspected sepsis. EXAM: CT HEAD WITHOUT CONTRAST TECHNIQUE: Contiguous axial images were obtained from the base of the skull through the vertex without intravenous contrast. RADIATION DOSE REDUCTION: This exam was performed according to the departmental dose-optimization program which includes automated exposure control, adjustment of the mA and/or kV according to patient size and/or use of iterative reconstruction technique. COMPARISON:  None Available. FINDINGS: Brain: No evidence of acute infarction, hemorrhage, hydrocephalus, extra-axial collection or mass lesion/mass effect. Vascular: No hyperdense vessel or unexpected calcification. Skull: Normal. Negative for fracture or focal lesion. Sinuses/Orbits: Fluid level in the left sphenoid sinus where there is background wall sclerosis suggesting chronic sinusitis. IMPRESSION: 1. Normal appearance of the brain. 2. Small left sphenoid sinus fluid level. Electronically Signed   By: Tiburcio Pea M.D.   On: 10/31/2023 04:08     TODAY-DAY OF DISCHARGE:  Subjective:   Brian [name] today has no headache,no chest abdominal pain,no new weakness tingling or numbness, feels much better wants to go home today.   Objective:   Blood pressure 131/65,  pulse 78, temperature 99.4 F (37.4 C), temperature source Oral, resp. rate (!) 23, height  6' (1.829 m), weight 124 kg, SpO2 93%.  Intake/Output Summary (Last 24 hours) at 11/08/2023 1049 Last data filed at 11/08/2023 0019 Gross per 24 hour  Intake 480 ml  Output 800 ml  Net -320 ml   Filed Weights   11/06/23 0500 11/07/23 0500 11/08/23 0500  Weight: 124 kg 124 kg 124 kg    Exam: Awake Alert, Oriented *3, No new F.N deficits, Normal affect [place].AT,PERRAL Supple Neck,No JVD, No cervical lymphadenopathy appriciated.  Symmetrical Chest wall movement, Good air movement bilaterally, CTAB RRR,No Gallops,Rubs or new Murmurs, No Parasternal Heave +ve B.Sounds, Abd Soft, Non tender, No organomegaly appriciated, No rebound -guarding or rigidity. No Cyanosis, Clubbing or edema, No new Rash or bruise   PERTINENT RADIOLOGIC STUDIES: ECHOCARDIOGRAM COMPLETE  Result Date: 10/31/2023    ECHOCARDIOGRAM REPORT   Patient Name:   Brian [name] Date of Exam: 10/31/2023 Medical Rec #:  [id number]       Height:       72.0 in Accession #:    [id number]      Weight:       272.5 lb Date of Birth:  1961-04-11      BSA:          2.430 m Patient Age:    62 years        BP:           139/80 mmHg Patient Gender: M               HR:           94 bpm. Exam Location:  Inpatient Procedure: 2D Echo, Cardiac Doppler, Color Doppler and Intracardiac            Opacification Agent Indications:    Abnormal ECG R94.31  History:        Patient has no prior history of Echocardiogram examinations.                 Risk Factors:Hypertension.  Sonographer:    Darlys Gales Referring Phys: [id number] OMAR M ALBUSTAMI IMPRESSIONS  1. Left ventricular ejection fraction, by estimation, is 60 to 65%. The left ventricle has normal function. The left ventricle has no regional wall motion abnormalities. There is mild concentric left ventricular hypertrophy. Left ventricular diastolic function could not be evaluated.  2. Right ventricular systolic function is normal. The right ventricular size is normal.  3. The mitral valve is normal in  structure. No evidence of mitral valve regurgitation. No evidence of mitral stenosis.  4. The aortic valve is tricuspid. Aortic valve regurgitation is not visualized. Aortic valve sclerosis/calcification is present, without any evidence of aortic stenosis. Aortic valve area, by VTI measures 3.42 cm. Aortic valve mean gradient measures 4.0 mmHg. Aortic valve Vmax measures 1.31 m/s. FINDINGS  Left Ventricle: Left ventricular ejection fraction, by estimation, is 60 to 65%. The left ventricle has normal function. The left ventricle has no regional wall motion abnormalities. The left ventricular internal cavity size was normal in size. There is  mild concentric left ventricular hypertrophy. Left ventricular diastolic function could not be evaluated. Right Ventricle: The right ventricular size is normal. No increase in right ventricular wall thickness. Right ventricular systolic function is normal. Left Atrium: Left atrial size was normal in size. Right Atrium: Right atrial size was normal in size. Pericardium: There is no evidence of pericardial effusion. Mitral Valve: The mitral valve is normal  in structure. Mild mitral annular calcification. No evidence of mitral valve regurgitation. No evidence of mitral valve stenosis. Tricuspid Valve: The tricuspid valve is normal in structure. Tricuspid valve regurgitation is not demonstrated. No evidence of tricuspid stenosis. Aortic Valve: The aortic valve is tricuspid. Aortic valve regurgitation is not visualized. Aortic valve sclerosis/calcification is present, without any evidence of aortic stenosis. Aortic valve mean gradient measures 4.0 mmHg. Aortic valve peak gradient measures 6.9 mmHg. Aortic valve area, by VTI measures 3.42 cm. Pulmonic Valve: The pulmonic valve was normal in structure. Pulmonic valve regurgitation is not visualized. No evidence of pulmonic stenosis. Aorta: The aortic root is normal in size and structure. Venous: The inferior vena cava was not well  visualized. IAS/Shunts: No atrial level shunt detected by color flow Doppler.  LEFT VENTRICLE PLAX 2D LVIDd:         5.00 cm   Diastology LVIDs:         3.50 cm   LV e' medial:   7.18 cm/s LV PW:         1.40 cm   LV E/e' medial: 11.7 LV IVS:        1.30 cm LVOT diam:     2.25 cm LV SV:         71 LV SV Index:   29 LVOT Area:     3.98 cm  RIGHT VENTRICLE RV S prime:     15.40 cm/s TAPSE (M-mode): 1.8 cm LEFT ATRIUM           Index LA Vol (A2C): 35.4 ml 14.57 ml/m LA Vol (A4C): 33.9 ml 13.95 ml/m  AORTIC VALVE AV Area (Vmax):    3.19 cm AV Area (Vmean):   3.21 cm AV Area (VTI):     3.42 cm AV Vmax:           131.00 cm/s AV Vmean:          85.900 cm/s AV VTI:            0.207 m AV Peak Grad:      6.9 mmHg AV Mean Grad:      4.0 mmHg LVOT Vmax:         105.00 cm/s LVOT Vmean:        69.400 cm/s LVOT VTI:          0.178 m LVOT/AV VTI ratio: 0.86  AORTA Ao Root diam: 3.50 cm MITRAL VALVE MV Area (PHT): 3.45 cm    SHUNTS MV Decel Time: 220 msec    Systemic VTI:  0.18 m MV E velocity: 83.70 cm/s  Systemic Diam: 2.25 cm MV A velocity: 96.70 cm/s MV E/A ratio:  0.87 Armanda Magic MD Electronically signed by Armanda Magic MD Signature Date/Time: 10/31/2023/1:56:40 PM    Final      PERTINENT LAB RESULTS: CBC: Recent Labs    11/06/23 0414  WBC 6.9  HGB 12.6*  HCT 36.3*  PLT 135*   CMET CMP     Component Value Date/Time   NA 135 11/08/2023 0431   K 4.4 11/08/2023 0431   CL 111 11/08/2023 0431   CO2 19 (L) 11/08/2023 0431   GLUCOSE 91 11/08/2023 0431   BUN 8 11/08/2023 0431   CREATININE 0.38 (L) 11/08/2023 0431   CALCIUM 8.6 (L) 11/08/2023 0431   PROT 8.0 11/01/2023 0306   ALBUMIN 2.7 (L) 11/01/2023 0306   AST 40 11/01/2023 0306   ALT 25 11/01/2023 0306   ALKPHOS 65 11/01/2023 0306   BILITOT 8.8 (  H) 11/01/2023 0306   GFRNONAA >60 11/08/2023 0431    GFR Estimated Creatinine Clearance: 133.6 mL/min (A) (by C-G formula based on SCr of 0.38 mg/dL (L)). No results for input(s): "LIPASE",  "AMYLASE" in the last 72 hours. No results for input(s): "CKTOTAL", "CKMB", "CKMBINDEX", "TROPONINI" in the last 72 hours. Invalid input(s): "POCBNP" No results for input(s): "DDIMER" in the last 72 hours. No results for input(s): "HGBA1C" in the last 72 hours. No results for input(s): "CHOL", "HDL", "LDLCALC", "TRIG", "CHOLHDL", "LDLDIRECT" in the last 72 hours. No results for input(s): "TSH", "T4TOTAL", "T3FREE", "THYROIDAB" in the last 72 hours.  Invalid input(s): "FREET3" No results for input(s): "VITAMINB12", "FOLATE", "FERRITIN", "TIBC", "IRON", "RETICCTPCT" in the last 72 hours. Coags: No results for input(s): "INR" in the last 72 hours.  Invalid input(s): "PT" Microbiology: Recent Results (from the past 240 hour(s))  Culture, blood (Routine x 2)     Status: None   Collection Time: 10/30/23 11:34 PM   Specimen: BLOOD LEFT ARM  Result Value Ref Range Status   Specimen Description BLOOD LEFT ARM  Final   Special Requests   Final    BOTTLES DRAWN AEROBIC AND ANAEROBIC Blood Culture adequate volume   Culture   Final    NO GROWTH 5 DAYS Performed at [facility], [address] [place], [place] [address]    Report Status 11/04/2023 FINAL  Final  Culture, blood (Routine x 2)     Status: None   Collection Time: 10/30/23 11:34 PM   Specimen: BLOOD RIGHT ARM  Result Value Ref Range Status   Specimen Description BLOOD RIGHT ARM  Final   Special Requests   Final    BOTTLES DRAWN AEROBIC AND ANAEROBIC Blood Culture adequate volume   Culture   Final    NO GROWTH 5 DAYS Performed at [facility], [address], [place], [place] [address]    Report Status 11/04/2023 FINAL  Final  Urine Culture     Status: None   Collection Time: 10/31/23  2:56 AM   Specimen: Urine, Catheterized  Result Value Ref Range Status   Specimen Description URINE, CATHETERIZED  Final   Special Requests NONE  Final   Culture   Final    NO GROWTH Performed at [facility], [address] [place], [place] [address]    Report Status 11/01/2023 FINAL  Final  MRSA Next Gen by PCR, Nasal     Status: None   Collection Time: 10/31/23  3:56 AM   Specimen: Nasal Mucosa; Nasal Swab  Result Value Ref Range Status   MRSA by PCR Next Gen NOT DETECTED NOT DETECTED Final    Comment: (NOTE) The GeneXpert MRSA Assay (FDA approved for NASAL specimens only), is one component of a comprehensive MRSA colonization surveillance program. It is not intended to diagnose MRSA infection nor to guide or monitor treatment for MRSA infections. Test performance is not FDA approved in patients less than 1 years old. Performed at [facility], [address], [place], [place] [address]   Gastrointestinal Panel by PCR , Stool     Status: None   Collection Time: 11/03/23 10:28 PM   Specimen: Stool  Result Value Ref Range Status   Campylobacter species NOT DETECTED NOT DETECTED Final   Plesimonas shigelloides NOT DETECTED NOT DETECTED Final   Salmonella species NOT DETECTED NOT DETECTED Final   Yersinia enterocolitica NOT DETECTED NOT DETECTED Final   Vibrio species NOT DETECTED NOT  DETECTED Final   Vibrio cholerae NOT DETECTED NOT DETECTED Final   Enteroaggregative E coli (EAEC) NOT DETECTED NOT DETECTED Final   Enteropathogenic E coli (EPEC) NOT DETECTED NOT DETECTED Final   Enterotoxigenic E coli (ETEC) NOT DETECTED NOT DETECTED Final   Shiga like toxin producing E coli (STEC) NOT DETECTED NOT DETECTED Final   Shigella/Enteroinvasive E coli (EIEC) NOT DETECTED NOT DETECTED Final   Cryptosporidium NOT DETECTED NOT DETECTED Final   Cyclospora cayetanensis NOT DETECTED NOT DETECTED Final   Entamoeba histolytica NOT DETECTED NOT DETECTED Final   Giardia lamblia NOT DETECTED NOT DETECTED Final   Adenovirus F40/41 NOT DETECTED NOT DETECTED Final   Astrovirus NOT DETECTED NOT DETECTED Final   Norovirus GI/GII NOT DETECTED NOT DETECTED Final   Rotavirus A NOT DETECTED NOT DETECTED Final    Sapovirus (I, II, IV, and V) NOT DETECTED NOT DETECTED Final    Comment: Performed at [facility], [address] [place], [place] [address]    FURTHER DISCHARGE INSTRUCTIONS:  Get Medicines reviewed and adjusted: Please take all your medications with you for your next visit with your Primary MD  Laboratory/radiological data: Please request your Primary MD to go over all hospital tests and procedure/radiological results at the follow up, please ask your Primary MD to get all Hospital records sent to his/her office.  In some cases, they will be blood work, cultures and biopsy results pending at the time of your discharge. Please request that your primary care M.D. goes through all the records of your hospital data and follows up on these results.  Also Note the following: If you experience worsening of your admission symptoms, develop shortness of breath, life threatening emergency, suicidal or homicidal thoughts you must seek medical attention immediately by calling 911 or calling your MD immediately  if symptoms less severe.  You must read complete instructions/literature along with all the possible adverse reactions/side effects for all the Medicines you take and that have been prescribed to you. Take any new Medicines after you have completely understood and accpet all the possible adverse reactions/side effects.   Do not drive when taking Pain medications or sleeping medications (Benzodaizepines)  Do not take more than prescribed Pain, Sleep and Anxiety Medications. It is not advisable to combine anxiety,sleep and pain medications without talking with your primary care practitioner  Special Instructions: If you have smoked or chewed Tobacco  in the last 2 yrs please stop smoking, stop any regular Alcohol  and or any Recreational drug use.  Wear Seat belts while driving.  Please note: You were cared for by a hospitalist during your hospital stay. Once you are discharged,  your primary care physician will handle any further medical issues. Please note that NO REFILLS for any discharge medications will be authorized once you are discharged, as it is imperative that you return to your primary care physician (or establish a relationship with a primary care physician if you do not have one) for your post hospital discharge needs so that they can reassess your need for medications and monitor your lab values.  Total Time spent coordinating discharge including counseling, education and face to face time equals greater than 30 minutes.  SignedJeoffrey Massed 11/08/2023 10:49 AM

## 2023-11-08 NOTE — H&P (Signed)
Physical Medicine and Rehabilitation Admission H&P  CC: Encephalopathy  HPI: Brian [name] is a 62 year old right-handed male with history of obesity BMI 38.8, fatty liver, alcohol abuse with alcoholic pancreatitis,chronic lymphedema and hypertension as well as tobacco use.  Per chart review patient lives with spouse.  Two-level home bed and bath on main level with one-step to entry.  Patient was able to ambulate short distances around the house with a rolling walker sometimes needed.  Wheelchair for community distances.  Independent with basic ADLs.  Presented 10/30/2023 after he was found by his wife on the couch in his own urine and feces with altered mental status.  With EMS he was noted to be tachycardic and warm to touch.  His oxygen saturation are 87%.  He did receive 4 mg of IV Ativan due to concerns for possible DTs.  CT/MRI of the brain showed no acute reversible findings.  CT of the chest abdomen pelvis findings concerning for potential acute appendicitis given the dilated inflamed appearing appendix containing multiple appendicoliths.  Diffuse dilatation of the small bowel and colon suggesting possible ileus.  No definite acute findings in the thorax.  Hepatomegaly with severe hepatic steatosis.  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  Chemistries blood cultures no growth to date, potassium 3.0, glucose 152, BUN 25, AST 44, total bilirubin 9.4, troponin negative, lipase 35, alcohol negative, hemoglobin A1c 5.8, ammonia level 42, lactic acid 3.8-0.8.  General surgery was consulted for low suspicion of appendicitis and advised to continue to monitor.  He did remain on empiric antibiotics for short time.  Placed on lactulose for elevated ammonia levels and continued to be monitored.  Blood pressures remained a bit soft maintained on ProAmatine.  Lovenox added for DVT prophylaxis.  Mechanical soft diet with thin liquids.  Therapy  evaluations completed due to patient decreased functional mobility/acute encephalopathy was admitted for a comprehensive rehab program.  Review of Systems  Constitutional:  Positive for malaise/fatigue. Negative for chills and fever.  HENT:  Negative for hearing loss.   Eyes:  Negative for blurred vision and double vision.  Respiratory:  Negative for cough, shortness of breath and wheezing.   Cardiovascular:  Positive for leg swelling. Negative for chest pain and palpitations.  Gastrointestinal:  Positive for constipation, nausea and vomiting.  Genitourinary:  Negative for dysuria, flank pain and hematuria.  Musculoskeletal:  Positive for joint pain and myalgias.  Skin:  Negative for rash.  Neurological:  Positive for weakness.  All other systems reviewed and are negative.  Past Medical History:  Diagnosis Date   Alcoholic pancreatitis    ETOH abuse    HTN (hypertension)    Hypertension    Morbid obesity (HCC)    No past surgical history on file. No family history on file. Social History:  reports that he has been smoking cigarettes. He does not have any smokeless tobacco history on file. No history on file for alcohol use and drug use. Allergies: No Known Allergies Medications Prior to Admission  Medication Sig Dispense Refill   amLODipine (NORVASC) 10 MG tablet Take 1 tablet (10 mg total) by mouth daily. 30 tablet 2   Multiple Vitamins-Minerals (MULTIVITAMIN WITH MINERALS) tablet Take 1 tablet by mouth daily.     Omega-3 Fatty Acids (FISH OIL) 1000 MG CAPS Take 2,000 mg by mouth daily.     thiamine (VITAMIN B-1) 100 MG tablet Take 1 tablet (100 mg total) by mouth daily.  Home: Home Living Family/patient expects to be discharged to:: Private residence Living Arrangements: Spouse/significant other, Children Available Help at Discharge: Family, Available 24 hours/day Type of Home: House Home Access: Stairs to enter [employer] of Steps: 1 Entrance  Stairs-Rails: None Home Layout: 1/2 bath on main level, Two level, Other (Comment) (sleeping on couch or on recliner) Alternate Level Stairs-Number of Steps: flight Alternate Level Stairs-Rails: Can reach both Bathroom Shower/Tub: Sponge bathes at baseline Bathroom Toilet: Standard Bathroom Accessibility: Yes Home Equipment: Agricultural consultant (2 wheels), Rollator (4 wheels), Cane - single point, Hand held shower head  Lives With: Spouse, Son   Functional History: Prior Function Prior Level of Function : Needs assist, Working/employed, Driving Mobility Comments: Pt states he was able to ambulate short distances around the house sometimes with a RW sometimes without it. WC for community distances. Pt states he needs help getting up from low surfaces. ADLs Comments: pt states that family was assisting with ADLs as needed, most assist needed for toilet transfers adn LB ADLs   Functional Status:  Mobility: Bed Mobility Overal bed mobility: Needs Assistance Bed Mobility: Rolling Rolling: Min assist Supine to sit: Contact guard, Used rails, HOB elevated Sit to supine: Min assist, HOB elevated General bed mobility comments: several rolls L&R to assist with peri care and linen change Transfers Overall transfer level: Needs assistance Equipment used: Rolling walker (2 wheels) Transfers: Sit to/from Stand Sit to Stand: +2 physical assistance, Max assist Bed to/from chair/wheelchair/BSC transfer type:: Via Lift equipment General transfer comment: pt able to stand with bari RW with +2 Max A however unable to come fully upright and noted to have BM upon standing along with HR increasing to 156. RN made aware Ambulation/Gait General Gait Details: unable   ADL: ADL Overall ADL's : Needs assistance/impaired Eating/Feeding: Independent Grooming: Set up, Sitting Upper Body Bathing: Set up, Sitting Lower Body Bathing: Maximal assistance, Bed level Upper Body Dressing : Set up, Sitting Lower Body  Dressing: Total assistance Toilet Transfer: Total assistance Toilet Transfer Details (indicate cue type and reason): soiled upon arrival Toileting- Clothing Manipulation and Hygiene: Total assistance, +2 for physical assistance, +2 for safety/equipment, Bed level Toileting - Clothing Manipulation Details (indicate cue type and reason): total A for peri care - multiple bleeding wounds on his bottom. Functional mobility during ADLs: Minimal assistance (at bed level) General ADL Comments: limited by incontinence   Cognition: Cognition Overall Cognitive Status: Within Functional Limits for tasks assessed Orientation Level: Oriented to person, Oriented to place, Oriented to situation Cognition Arousal: Alert Behavior During Therapy: [facility] for tasks assessed/performed Overall Cognitive Status: Within Functional Limits for tasks assessed Area of Impairment: Attention, Safety/judgement, Awareness, Problem solving Orientation Level: Disoriented to, Time Current Attention Level: Selective Safety/Judgement: Decreased awareness of safety, Decreased awareness of deficits Awareness: Emergent Problem Solving: Slow processing, Decreased initiation, Requires verbal cues General Comments: continues to be [facility], limited insight. Pt soiled upon arrival, he reports he called out over an hour ago without assist. Educated pt on importance or hygiene for skin integrity and to call out again if staff does not assist in a timely way.   Physical Exam: There were no vitals taken for this visit.  Physical Exam Gen: no distress, normal appearing HEENT: oral mucosa pink and moist, NCAT Cardio: Reg rate Chest: normal effort, normal rate of breathing Abd: soft, non-distended Ext: no edema Psych: pleasant, normal affect Skin: intact Neurological:     Comments: Patient is alert and resting comfortably.  Makes eye contact with  examiner.  Follows simple commands.  Provides name and age but was limited as far as a  medical historian  MSK: left knee is swollen, TTP, and limits his elevation of LLE, strength is otherwise intact  Results for orders placed or performed during the hospital encounter of 10/30/23 (from the past 48 hour(s))  Glucose, capillary     Status: Abnormal   Collection Time: 11/06/23  4:23 PM  Result Value Ref Range   Glucose-Capillary 124 (H) 70 - 99 mg/dL    Comment: Glucose reference range applies only to samples taken after fasting for at least 8 hours.  Glucose, capillary     Status: Abnormal   Collection Time: 11/06/23  7:58 PM  Result Value Ref Range   Glucose-Capillary 131 (H) 70 - 99 mg/dL    Comment: Glucose reference range applies only to samples taken after fasting for at least 8 hours.  Glucose, capillary     Status: Abnormal   Collection Time: 11/06/23 11:34 PM  Result Value Ref Range   Glucose-Capillary 105 (H) 70 - 99 mg/dL    Comment: Glucose reference range applies only to samples taken after fasting for at least 8 hours.   Comment 1 Notify RN    Comment 2 Document in Chart   Glucose, capillary     Status: None   Collection Time: 11/07/23  4:00 AM  Result Value Ref Range   Glucose-Capillary 82 70 - 99 mg/dL    Comment: Glucose reference range applies only to samples taken after fasting for at least 8 hours.   Comment 1 Notify RN    Comment 2 Document in Chart   Basic metabolic panel     Status: Abnormal   Collection Time: 11/07/23  4:20 AM  Result Value Ref Range   Sodium 136 135 - 145 mmol/L   Potassium 3.7 3.5 - 5.1 mmol/L   Chloride 113 (H) 98 - 111 mmol/L   CO2 20 (L) 22 - 32 mmol/L   Glucose, Bld 96 70 - 99 mg/dL    Comment: Glucose reference range applies only to samples taken after fasting for at least 8 hours.   BUN 8 6 - 20 mg/dL   Creatinine, Ser 1.61 (L) 0.61 - 1.24 mg/dL   Calcium 8.4 (L) 8.9 - 10.3 mg/dL   GFR, Estimated >09 >60 mL/min    Comment: (NOTE) Calculated using the CKD-EPI Creatinine Equation (2021)    Anion gap 3 (L) 5 - 15     Comment: Performed at [facility], [address], [place], [place] [address]  Phosphorus     Status: None   Collection Time: 11/07/23  4:20 AM  Result Value Ref Range   Phosphorus 3.0 2.5 - 4.6 mg/dL    Comment: Performed at [facility], [address], [place], [place] [address]  Glucose, capillary     Status: None   Collection Time: 11/07/23  8:10 AM  Result Value Ref Range   Glucose-Capillary 88 70 - 99 mg/dL    Comment: Glucose reference range applies only to samples taken after fasting for at least 8 hours.  Glucose, capillary     Status: Abnormal   Collection Time: 11/07/23 11:30 AM  Result Value Ref Range   Glucose-Capillary 161 (H) 70 - 99 mg/dL    Comment: Glucose reference range applies only to samples taken after fasting for at least 8 hours.  Glucose, capillary     Status: Abnormal   Collection Time: 11/07/23  4:58 PM  Result Value Ref Range   Glucose-Capillary 145 (H) 70 - 99 mg/dL    Comment: Glucose reference range applies only to samples taken after fasting for at least 8 hours.  Glucose, capillary     Status: Abnormal   Collection Time: 11/07/23  5:38 PM  Result Value Ref Range   Glucose-Capillary 148 (H) 70 - 99 mg/dL    Comment: Glucose reference range applies only to samples taken after fasting for at least 8 hours.  Glucose, capillary     Status: Abnormal   Collection Time: 11/07/23  8:07 PM  Result Value Ref Range   Glucose-Capillary 151 (H) 70 - 99 mg/dL    Comment: Glucose reference range applies only to samples taken after fasting for at least 8 hours.  Glucose, capillary     Status: Abnormal   Collection Time: 11/07/23 11:33 PM  Result Value Ref Range   Glucose-Capillary 118 (H) 70 - 99 mg/dL    Comment: Glucose reference range applies only to samples taken after fasting for at least 8 hours.  Glucose, capillary     Status: Abnormal   Collection Time: 11/08/23  4:09 AM  Result Value Ref Range   Glucose-Capillary 102 (H) 70 - 99 mg/dL     Comment: Glucose reference range applies only to samples taken after fasting for at least 8 hours.  Basic metabolic panel     Status: Abnormal   Collection Time: 11/08/23  4:31 AM  Result Value Ref Range   Sodium 135 135 - 145 mmol/L   Potassium 4.4 3.5 - 5.1 mmol/L   Chloride 111 98 - 111 mmol/L   CO2 19 (L) 22 - 32 mmol/L   Glucose, Bld 91 70 - 99 mg/dL    Comment: Glucose reference range applies only to samples taken after fasting for at least 8 hours.   BUN 8 6 - 20 mg/dL   Creatinine, Ser 1.61 (L) 0.61 - 1.24 mg/dL   Calcium 8.6 (L) 8.9 - 10.3 mg/dL   GFR, Estimated >09 >60 mL/min    Comment: (NOTE) Calculated using the CKD-EPI Creatinine Equation (2021)    Anion gap 5 5 - 15    Comment: Performed at [facility], [address] [place], [place] [address]  Glucose, capillary     Status: Abnormal   Collection Time: 11/08/23  5:03 AM  Result Value Ref Range   Glucose-Capillary 125 (H) 70 - 99 mg/dL    Comment: Glucose reference range applies only to samples taken after fasting for at least 8 hours.  Glucose, capillary     Status: Abnormal   Collection Time: 11/08/23  8:19 AM  Result Value Ref Range   Glucose-Capillary 242 (H) 70 - 99 mg/dL    Comment: Glucose reference range applies only to samples taken after fasting for at least 8 hours.   ECHOCARDIOGRAM COMPLETE  Result Date: 10/31/2023    ECHOCARDIOGRAM REPORT   Patient Name:   Brian [name] Date of Exam: 10/31/2023 Medical Rec #:  [id number]       Height:       72.0 in Accession #:    [id number]      Weight:       272.5 lb Date of Birth:  01-04-1961      BSA:          2.430 m Patient Age:    62 years        BP:  139/80 mmHg Patient Gender: M               HR:           94 bpm. Exam Location:  Inpatient Procedure: 2D Echo, Cardiac Doppler, Color Doppler and Intracardiac            Opacification Agent Indications:    Abnormal ECG R94.31  History:        Patient has no prior history of Echocardiogram  examinations.                 Risk Factors:Hypertension.  Sonographer:    Darlys Gales Referring Phys: [id number] OMAR M ALBUSTAMI IMPRESSIONS  1. Left ventricular ejection fraction, by estimation, is 60 to 65%. The left ventricle has normal function. The left ventricle has no regional wall motion abnormalities. There is mild concentric left ventricular hypertrophy. Left ventricular diastolic function could not be evaluated.  2. Right ventricular systolic function is normal. The right ventricular size is normal.  3. The mitral valve is normal in structure. No evidence of mitral valve regurgitation. No evidence of mitral stenosis.  4. The aortic valve is tricuspid. Aortic valve regurgitation is not visualized. Aortic valve sclerosis/calcification is present, without any evidence of aortic stenosis. Aortic valve area, by VTI measures 3.42 cm. Aortic valve mean gradient measures 4.0 mmHg. Aortic valve Vmax measures 1.31 m/s. FINDINGS  Left Ventricle: Left ventricular ejection fraction, by estimation, is 60 to 65%. The left ventricle has normal function. The left ventricle has no regional wall motion abnormalities. The left ventricular internal cavity size was normal in size. There is  mild concentric left ventricular hypertrophy. Left ventricular diastolic function could not be evaluated. Right Ventricle: The right ventricular size is normal. No increase in right ventricular wall thickness. Right ventricular systolic function is normal. Left Atrium: Left atrial size was normal in size. Right Atrium: Right atrial size was normal in size. Pericardium: There is no evidence of pericardial effusion. Mitral Valve: The mitral valve is normal in structure. Mild mitral annular calcification. No evidence of mitral valve regurgitation. No evidence of mitral valve stenosis. Tricuspid Valve: The tricuspid valve is normal in structure. Tricuspid valve regurgitation is not demonstrated. No evidence of tricuspid stenosis. Aortic Valve:  The aortic valve is tricuspid. Aortic valve regurgitation is not visualized. Aortic valve sclerosis/calcification is present, without any evidence of aortic stenosis. Aortic valve mean gradient measures 4.0 mmHg. Aortic valve peak gradient measures 6.9 mmHg. Aortic valve area, by VTI measures 3.42 cm. Pulmonic Valve: The pulmonic valve was normal in structure. Pulmonic valve regurgitation is not visualized. No evidence of pulmonic stenosis. Aorta: The aortic root is normal in size and structure. Venous: The inferior vena cava was not well visualized. IAS/Shunts: No atrial level shunt detected by color flow Doppler.  LEFT VENTRICLE PLAX 2D LVIDd:         5.00 cm   Diastology LVIDs:         3.50 cm   LV e' medial:   7.18 cm/s LV PW:         1.40 cm   LV E/e' medial: 11.7 LV IVS:        1.30 cm LVOT diam:     2.25 cm LV SV:         71 LV SV Index:   29 LVOT Area:     3.98 cm  RIGHT VENTRICLE RV S prime:     15.40 cm/s TAPSE (M-mode): 1.8 cm LEFT ATRIUM  Index LA Vol (A2C): 35.4 ml 14.57 ml/m LA Vol (A4C): 33.9 ml 13.95 ml/m  AORTIC VALVE AV Area (Vmax):    3.19 cm AV Area (Vmean):   3.21 cm AV Area (VTI):     3.42 cm AV Vmax:           131.00 cm/s AV Vmean:          85.900 cm/s AV VTI:            0.207 m AV Peak Grad:      6.9 mmHg AV Mean Grad:      4.0 mmHg LVOT Vmax:         105.00 cm/s LVOT Vmean:        69.400 cm/s LVOT VTI:          0.178 m LVOT/AV VTI ratio: 0.86  AORTA Ao Root diam: 3.50 cm MITRAL VALVE MV Area (PHT): 3.45 cm    SHUNTS MV Decel Time: 220 msec    Systemic VTI:  0.18 m MV E velocity: 83.70 cm/s  Systemic Diam: 2.25 cm MV A velocity: 96.70 cm/s MV E/A ratio:  0.87 Armanda Magic MD Electronically signed by Armanda Magic MD Signature Date/Time: 10/31/2023/1:56:40 PM    Final       There were no vitals taken for this visit.  Medical Problem List and Plan: 1. Functional deficits secondary to acute encephalopathy, hepatic and possible metabolic component  -patient may  shower  -ELOS/Goals: 10-14 days S  Admit to CIR 2.  Antithrombotics: -DVT/anticoagulation:  Pharmaceutical: Lovenox  -antiplatelet therapy: N/A 3.Left knee pain: XR ordered, voltaren gel ordered 4. Mood/Behavior/Sleep: Provide emotional support  -antipsychotic agents: N/A 5. Neuropsych/cognition: This patient is capable of making decisions on his own behalf. 6. Skin/Wound Care: Routine skin checks..Excoriation of buttocks with wound care as directed 7. Fluids/Electrolytes/Nutrition: Routine in and outs with follow-up chemistries 8.  Colitis versus ileitis-acute appendicitis ruled out.  Follow-up outpatient general surgery 9.  Hypertension.  Blood pressure remains a bit soft remains on ProAmatine 10 mg 3 times daily as well as low-dose Coreg 12.5 mg twice daily. 10.  Chronic lymphedema.  Follow-up outpatient. 11.History of alcohol use.  Counseling 12.  Tobacco use.  Counseling 13.  Obesity.  BMI 37.08.  Dietary follow-up 14.  Prediabetes.  Hemoglobin A1c 5.8. d/c ISS and start metformin ER release with dinner, creatinine reviewed and is stbale 15.  BPH.  Flomax 0.4 mg daily. BP reviewed and is stable    Charlton Amor, PA-C    Horton Chin, MD 11/08/2023

## 2023-11-09 DIAGNOSIS — G9341 Metabolic encephalopathy: Secondary | ICD-10-CM

## 2023-11-09 LAB — GLUCOSE, CAPILLARY
Glucose-Capillary: 99 mg/dL (ref 70–99)
Glucose-Capillary: 98 mg/dL (ref 70–99)

## 2023-11-09 LAB — CBC WITH DIFFERENTIAL/PLATELET
Abs Immature Granulocytes: 0.01 10*3/uL (ref 0.00–0.07)
Basophils Absolute: 0 10*3/uL (ref 0.0–0.1)
Basophils Relative: 1 %
Eosinophils Absolute: 0.3 10*3/uL (ref 0.0–0.5)
Eosinophils Relative: 7 %
HCT: 30.4 % — ABNORMAL LOW (ref 39.0–52.0)
Hemoglobin: 10.7 g/dL — ABNORMAL LOW (ref 13.0–17.0)
Immature Granulocytes: 0 %
Lymphocytes Relative: 19 %
Lymphs Abs: 0.8 10*3/uL (ref 0.7–4.0)
MCH: 34.3 pg — ABNORMAL HIGH (ref 26.0–34.0)
MCHC: 35.2 g/dL (ref 30.0–36.0)
MCV: 97.4 fL (ref 80.0–100.0)
Monocytes Absolute: 0.5 10*3/uL (ref 0.1–1.0)
Monocytes Relative: 12 %
Neutro Abs: 2.5 10*3/uL (ref 1.7–7.7)
Neutrophils Relative %: 61 %
Platelets: 150 10*3/uL (ref 150–400)
RBC: 3.12 MIL/uL — ABNORMAL LOW (ref 4.22–5.81)
RDW: 14.6 % (ref 11.5–15.5)
WBC: 4.2 10*3/uL (ref 4.0–10.5)
nRBC: 0 % (ref 0.0–0.2)

## 2023-11-09 LAB — COMPREHENSIVE METABOLIC PANEL
ALT: 26 U/L (ref 0–44)
AST: 39 U/L (ref 15–41)
Albumin: 2 g/dL — ABNORMAL LOW (ref 3.5–5.0)
Alkaline Phosphatase: 100 U/L (ref 38–126)
Anion gap: 4 — ABNORMAL LOW (ref 5–15)
BUN: 7 mg/dL (ref 6–20)
CO2: 21 mmol/L — ABNORMAL LOW (ref 22–32)
Calcium: 8.5 mg/dL — ABNORMAL LOW (ref 8.9–10.3)
Chloride: 108 mmol/L (ref 98–111)
Creatinine, Ser: 0.53 mg/dL — ABNORMAL LOW (ref 0.61–1.24)
GFR, Estimated: >60 mL/min (ref >60)
Glucose, Bld: 96 mg/dL (ref 70–99)
Potassium: 4 mmol/L (ref 3.5–5.1)
Sodium: 133 mmol/L — ABNORMAL LOW (ref 135–145)
Total Bilirubin: 3.4 mg/dL — ABNORMAL HIGH (ref <1.2)
Total Protein: 6.4 g/dL — ABNORMAL LOW (ref 6.5–8.1)

## 2023-11-09 LAB — AMMONIA: Ammonia: 32 umol/L (ref 9–35)

## 2023-11-09 MED ORDER — INSULIN ASPART 100 UNIT/ML IJ SOLN
0.0000 [IU] | Freq: Three times a day (TID) | INTRAMUSCULAR | Status: DC
Start: 2023-11-09 — End: 2023-11-09

## 2023-11-09 MED ORDER — MIDODRINE HCL 5 MG PO TABS
5.0000 mg | ORAL_TABLET | Freq: Two times a day (BID) | ORAL | Status: DC
  Administered 2023-11-09 – 2023-11-19 (×21): 5 mg via ORAL
  Filled 2023-11-09 (×21): qty 1

## 2023-11-09 MED ORDER — INSULIN ASPART 100 UNIT/ML IJ SOLN
0.0000 [IU] | Freq: Every day | INTRAMUSCULAR | Status: DC
Start: 2023-11-09 — End: 2023-11-09

## 2023-11-09 MED ORDER — MIDODRINE HCL 5 MG PO TABS: 5.0000 mg | ORAL_TABLET | Freq: Three times a day (TID) | ORAL | Status: DC

## 2023-11-09 NOTE — Plan of Care (Signed)
  Problem: RH Balance Goal: LTG Patient will maintain dynamic standing balance (PT) Description: LTG:  Patient will maintain dynamic standing balance with assistance during mobility activities (PT) Flowsheets (Taken 11/09/2023 1545) LTG: Pt will maintain dynamic standing balance during mobility activities with:: Contact Guard/Touching assist   Problem: Sit to Stand Goal: LTG:  Patient will perform sit to stand with assistance level (PT) Description: LTG:  Patient will perform sit to stand with assistance level (PT) Flowsheets (Taken 11/09/2023 1545) LTG: PT will perform sit to stand in preparation for functional mobility with assistance level: Contact Guard/Touching assist   Problem: RH Bed Mobility Goal: LTG Patient will perform bed mobility with assist (PT) Description: LTG: Patient will perform bed mobility with assistance, with/without cues (PT). Flowsheets (Taken 11/09/2023 1545) LTG: Pt will perform bed mobility with assistance level of: Independent with assistive device    Problem: RH Bed to Chair Transfers Goal: LTG Patient will perform bed/chair transfers w/assist (PT) Description: LTG: Patient will perform bed to chair transfers with assistance (PT). Flowsheets (Taken 11/09/2023 1545) LTG: Pt will perform Bed to Chair Transfers with assistance level: Contact Guard/Touching assist   Problem: RH Car Transfers Goal: LTG Patient will perform car transfers with assist (PT) Description: LTG: Patient will perform car transfers with assistance (PT). Flowsheets (Taken 11/09/2023 1545) LTG: Pt will perform car transfers with assist:: Contact Guard/Touching assist   Problem: RH Ambulation Goal: LTG Patient will ambulate in controlled environment (PT) Description: LTG: Patient will ambulate in a controlled environment, # of feet with assistance (PT). Flowsheets (Taken 11/09/2023 1545) LTG: Pt will ambulate in controlled environ  assist needed:: Contact Guard/Touching assist LTG:  Ambulation distance in controlled environment: 50' Goal: LTG Patient will ambulate in home environment (PT) Description: LTG: Patient will ambulate in home environment, # of feet with assistance (PT). Flowsheets (Taken 11/09/2023 1545) LTG: Pt will ambulate in home environ  assist needed:: Contact Guard/Touching assist LTG: Ambulation distance in home environment: 25'   Problem: RH Wheelchair Mobility Goal: LTG Patient will propel w/c in home environment (PT) Description: LTG: Patient will propel wheelchair in home environment, # of feet with assistance (PT). Flowsheets (Taken 11/09/2023 1545) LTG: Pt will propel w/c in home environ  assist needed:: Supervision/Verbal cueing Distance: wheelchair distance in controlled environment: 50   Problem: RH Stairs Goal: LTG Patient will ambulate up and down stairs w/assist (PT) Description: LTG: Patient will ambulate up and down # of stairs with assistance (PT) Flowsheets (Taken 11/09/2023 1545) LTG: Pt will ambulate up/down stairs assist needed:: Minimal Assistance - Patient > 75% LTG: Pt will  ambulate up and down number of stairs: 1

## 2023-11-09 NOTE — Evaluation (Signed)
Speech Language Pathology Assessment and Plan  Patient Details  Name: Brian [name] MRN: [id number] Date of Birth: Mar 08, 1961  SLP Diagnosis: Cognitive Impairments  Rehab Potential: Good ELOS: 14-17 days    Today's Date: 11/09/2023 SLP Individual Time: 1107-1200 SLP Individual Time Calculation (min): 53 min   Hospital Problem: Principal Problem:   Acute metabolic encephalopathy  Past Medical History:  Past Medical History:  Diagnosis Date   Alcoholic pancreatitis    ETOH abuse    HTN (hypertension)    Hypertension    Morbid obesity (HCC)    Past Surgical History: No past surgical history on file.  Assessment / Plan / Recommendation Clinical Impression 62 year old right-handed male with history of obesity BMI 38.8, fatty liver, alcohol abuse with alcoholic pancreatitis,chronic lymphedema and hypertension as well as tobacco use.  .  Patient was able to ambulate short distances around the house with a rolling walker sometimes needed.  Wheelchair for community distances.  Independent with basic ADLs.  Presented 10/30/2023 after he was found by his wife on the couch in his own urine and feces with altered mental status.     With EMS he was noted to be tachycardic and warm to touch.  His oxygen saturation are 87%.  He did receive 4 mg of IV Ativan due to concerns for possible DT's.  CT/MRI of the brain showed no acute reversible findings.  CT of the chest abdomen pelvis findings concerning for potential acute appendicitis given the dilated inflamed appearing appendix containing multiple appendicitis.  Diffuse dilatation of the small bowel and colon suggesting possible ileus.  No definite acute findings in the thorax.  Hepatomegaly with severe hepatic steatosis.  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  Chemistries blood cultures no growth to date, potassium 3.0, glucose 152, BUN 25, AST 44, total bilirubin 9.4, troponin negative,  lipase 35, alcohol negative, hemoglobin A1c 5.8, ammonia level 42, lactic acid 3.8-0.8.  General surgery was consulted for low suspicion of appendicitis and advised to continue to monitor.  He did remain on empiric antibiotics for short time.  Placed on lactulose for elevated ammonia levels and continued to be monitored.  Blood pressures remained a bit soft maintained on ProAmatine.  Lovenox added for DVT prophylaxis. Mechanical soft diet with thin liquids.   Cognitive-Linguistic Function: Patient presents with deficits in the areas of orientation, awareness, attention, problem solving, memory, and executive functioning, though suspect some level of baseline cognitive dysfunction. No family present to corroborate baseline cognitive function. Patient is not responsible for any household management tasks at baseline and appears to get assistance from his spouse and son since work accident reported by patient 1 year ago. Patient demonstrates no overt deficits in speech/language. Patient scored 12/30 on SLUMS and reported that he "did okay." Patient would benefit from therapy during CIR admission to target aforementioned deficits. Patient in agreement. Patient left In bed with call bell in reach and alarm set.    Skilled Therapeutic Interventions          SLP conducted skilled evaluation to assess cognitive linguistic function via SLUMS examination, patient interview, and functional environmental tasks. Full results above.    SLP Assessment  Patient will need skilled Speech Lanaguage Pathology Services during CIR admission    Recommendations  Oral Care Recommendations: Oral care BID Recommendations for Other Services: Neuropsych consult Patient destination: Home Follow up Recommendations: Home Health SLP Equipment Recommended: None recommended by SLP    SLP Frequency  3 to 5 out of 7 days   SLP Duration  SLP Intensity  SLP Treatment/Interventions 14-17 days  Minumum of 1-2 x/day, 30 to 90  minutes  Cognitive remediation/compensation;Cueing hierarchy;Functional tasks;Therapeutic Activities;Internal/external aids;Patient/family education    Pain Pain Assessment Pain Scale: 0-10 Pain Score: 0-No pain  Prior Functioning Cognitive/Linguistic Baseline: Within functional limits Type of Home: House  Lives With: Spouse;Son Available Help at Discharge: Family;Available 24 hours/day Education: 11th grade Vocation: Unemployed (seeking disability for injury from ~1 year ago)  SLP Evaluation Cognition Overall Cognitive Status: Impaired/Different from baseline Arousal/Alertness: Awake/alert Orientation Level: Oriented to situation;Disoriented to time;Oriented to person;Oriented to place Year: 2024 Month: November Day of Week: Incorrect Attention: Focused;Sustained Focused Attention: Appears intact Sustained Attention: Appears intact Selective Attention: Impaired Selective Attention Impairment: Functional basic Memory: Impaired Memory Impairment: Decreased short term memory;Storage deficit;Retrieval deficit Decreased Short Term Memory: Verbal basic;Functional basic Awareness: Impaired Awareness Impairment: Intellectual impairment;Emergent impairment;Anticipatory impairment Problem Solving: Impaired Problem Solving Impairment: Functional basic;Verbal basic Executive Function: Initiating;Self Monitoring Initiating: Impaired Initiating Impairment: Functional basic Self Monitoring: Impaired Self Monitoring Impairment: Functional basic Safety/Judgment: Impaired  Comprehension Auditory Comprehension Overall Auditory Comprehension: Appears within functional limits for tasks assessed Expression Expression Primary Mode of Expression: Verbal Verbal Expression Overall Verbal Expression: Appears within functional limits for tasks assessed Oral Motor Oral Motor/Sensory Function Overall Oral Motor/Sensory Function: Within functional limits Motor Speech Overall Motor Speech:  Appears within functional limits for tasks assessed Intelligibility: Intelligible  Care Tool Care Tool Cognition Ability to hear (with hearing aid or hearing appliances if normally used Ability to hear (with hearing aid or hearing appliances if normally used): 0. Adequate - no difficulty in normal conservation, social interaction, listening to TV   Expression of Ideas and Wants Expression of Ideas and Wants: 4. Without difficulty (complex and basic) - expresses complex messages without difficulty and with speech that is clear and easy to understand   Understanding Verbal and Non-Verbal Content Understanding Verbal and Non-Verbal Content: 4. Understands (complex and basic) - clear comprehension without cues or repetitions  Memory/Recall Ability Memory/Recall Ability : Current season;That he or she is in a hospital/hospital unit   Intelligibility: Intelligible  Short Term Goals: Week 1: SLP Short Term Goal 1 (Week 1): Patient will orient to date, time, situation, and person given external aids and min verbal cues. SLP Short Term Goal 2 (Week 1): Patient will utilize external and internal memory aids to recall daily information with mod verbal cues. SLP Short Term Goal 3 (Week 1): Patient will state two cognitive and two physical impairments demonstrating intellectual awareness given mod assist. SLP Short Term Goal 4 (Week 1): Patient will solve basic environmental problems in 4/5 opportunities given mod assist.  Refer to Care Plan for Long Term Goals  Recommendations for other services: Neuropsych  Discharge Criteria: Patient will be discharged from SLP if patient refuses treatment 3 consecutive times without medical reason, if treatment goals not met, if there is a change in medical status, if patient makes no progress towards goals or if patient is discharged from hospital.  The above assessment, treatment plan, treatment alternatives and goals were discussed and mutually agreed upon: by  patient  Jeannie Done, M.A., CCC-SLP  [name] 11/09/2023, 11:50 AM

## 2023-11-09 NOTE — Progress Notes (Signed)
Inpatient Rehabilitation  Patient information reviewed and entered into eRehab system by Oyuki Hogan M. Eri Mcevers, M.A., CCC/SLP, PPS Coordinator.  Information including medical coding, functional ability and quality indicators will be reviewed and updated through discharge.    

## 2023-11-09 NOTE — Plan of Care (Signed)
  Problem: RH Balance Goal: LTG: Patient will maintain dynamic sitting balance (OT) Description: LTG:  Patient will maintain dynamic sitting balance with assistance during activities of daily living (OT) Flowsheets (Taken 11/09/2023 1252) LTG: Pt will maintain dynamic sitting balance during ADLs with: Independent Goal: LTG Patient will maintain dynamic standing with ADLs (OT) Description: LTG:  Patient will maintain dynamic standing balance with assist during activities of daily living (OT)  Flowsheets (Taken 11/09/2023 1252) LTG: Pt will maintain dynamic standing balance during ADLs with: Contact Guard/Touching assist   Problem: Sit to Stand Goal: LTG:  Patient will perform sit to stand in prep for activites of daily living with assistance level (OT) Description: LTG:  Patient will perform sit to stand in prep for activites of daily living with assistance level (OT) Flowsheets (Taken 11/09/2023 1252) LTG: PT will perform sit to stand in prep for activites of daily living with assistance level: Contact Guard/Touching assist   Problem: RH Dressing Goal: LTG Patient will perform upper body dressing (OT) Description: LTG Patient will perform upper body dressing with assist, with/without cues (OT). Flowsheets (Taken 11/09/2023 1252) LTG: Pt will perform upper body dressing with assistance level of: Supervision/Verbal cueing Goal: LTG Patient will perform lower body dressing w/assist (OT) Description: LTG: Patient will perform lower body dressing with assist, with/without cues in positioning using equipment (OT) Flowsheets (Taken 11/09/2023 1252) LTG: Pt will perform lower body dressing with assistance level of: Minimal Assistance - Patient > 75%   Problem: RH Toileting Goal: LTG Patient will perform toileting task (3/3 steps) with assistance level (OT) Description: LTG: Patient will perform toileting task (3/3 steps) with assistance level (OT)  Flowsheets (Taken 11/09/2023 1252) LTG: Pt  will perform toileting task (3/3 steps) with assistance level: Minimal Assistance - Patient > 75%   Problem: RH Toilet Transfers Goal: LTG Patient will perform toilet transfers w/assist (OT) Description: LTG: Patient will perform toilet transfers with assist, with/without cues using equipment (OT) Flowsheets (Taken 11/09/2023 1252) LTG: Pt will perform toilet transfers with assistance level of: Contact Guard/Touching assist

## 2023-11-09 NOTE — Plan of Care (Signed)
  Problem: Consults Goal: RH GENERAL PATIENT EDUCATION Description: See Patient Education module for education specifics. Outcome: Progressing Goal: Skin Care Protocol Initiated - if Braden Score 18 or less Description: If consults are not indicated, leave blank or document N/A Outcome: Progressing   Problem: RH BOWEL ELIMINATION Goal: RH STG MANAGE BOWEL WITH ASSISTANCE Description: STG Manage Bowel with min Assistance. Outcome: Progressing Goal: RH STG MANAGE BOWEL W/MEDICATION W/ASSISTANCE Description: STG Manage Bowel with Medication with min Assistance. Outcome: Progressing   Problem: RH BLADDER ELIMINATION Goal: RH STG MANAGE BLADDER WITH ASSISTANCE Description: STG Manage Bladder With min Assistance Outcome: Progressing   Problem: RH SKIN INTEGRITY Goal: RH STG SKIN FREE OF INFECTION/BREAKDOWN Description: MASD will improve and skin will be free of infection without additional breakdown with min assist  Outcome: Progressing Goal: RH STG MAINTAIN SKIN INTEGRITY WITH ASSISTANCE Description: STG Maintain Skin Integrity With min Assistance. Outcome: Progressing Goal: RH STG ABLE TO PERFORM INCISION/WOUND CARE W/ASSISTANCE Description: STG Able To Perform Incision/Wound Care With min Assistance. Outcome: Progressing   Problem: RH SAFETY Goal: RH STG ADHERE TO SAFETY PRECAUTIONS W/ASSISTANCE/DEVICE Description: STG Adhere to Safety Precautions With min Assistance/Device. Outcome: Progressing Goal: RH STG DECREASED RISK OF FALL WITH ASSISTANCE Description: STG Decreased Risk of Fall With cueing Assistance. Outcome: Progressing   Problem: RH PAIN MANAGEMENT Goal: RH STG PAIN MANAGED AT OR BELOW PT'S PAIN GOAL Description: Less than 2 with PRN medications min assist  Outcome: Progressing   Problem: RH KNOWLEDGE DEFICIT GENERAL Goal: RH STG INCREASE KNOWLEDGE OF SELF CARE AFTER HOSPITALIZATION Description: Patient/caregiver will be able to manage medications, self care,  diet/lifestyle modifications to improve overall health from nursing education, nursing handouts and other resources independently  Outcome: Progressing

## 2023-11-09 NOTE — Progress Notes (Signed)
PROGRESS NOTE   Subjective/Complaints:  No events overnight.  Seen at bedside this morning, no acute complaints, no pain, poor insight and recollection surrounding events of hospitalization and states he is here "because I drink too much".  States he drinks approximately 3 shots of liquor per day.  Vitals stable, mild diastolic hypotension overnight but no reported symptoms.  Labs today singificant for mild NA decrease 133, HgB decrease 12.6 to 10.7 Last BM 11/21, multiple; incontinent b/b.    ROS: + bowel and bladder incontinence - new, Denies fevers, chills, N/V, abdominal pain, constipation, diarrhea, SOB, cough, chest pain, new weakness or paraesthesias.    Objective:   DG Knee 1-2 Views Left  Result Date: 11/08/2023 CLINICAL DATA:  Bilateral knee pain. Chronic lower extremity lymphedema. EXAM: LEFT KNEE - 1-2 VIEW COMPARISON:  None Available. FINDINGS: Moderate to severe medial compartment joint space narrowing and peripheral osteophytosis. Mild peripheral lateral compartment degenerative osteophytosis without joint space narrowing. Moderate to severe patellofemoral joint space narrowing with mild-to-moderate peripheral osteophytes. Mild chronic enthesopathic change at the quadriceps and patellar tendon insertions on the patella. No significant joint effusion. No acute fracture or dislocation. Moderate atherosclerotic calcifications. 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 Electronically Signed   By: Neita Garnet M.D.   On: 11/08/2023 14:00   ECHOCARDIOGRAM COMPLETE  Result Date: 10/31/2023    ECHOCARDIOGRAM REPORT   Patient Name:   Brian [name] Date of Exam: 10/31/2023 Medical Rec #:  [id number]       Height:       72.0 in Accession #:    [id number]      Weight:       272.5 lb Date of Birth:  22-Apr-1961      BSA:          2.430 m Patient Age:    62 years        BP:           139/80  mmHg Patient Gender: M               HR:           94 bpm. Exam Location:  Inpatient Procedure: 2D Echo, Cardiac Doppler, Color Doppler and Intracardiac            Opacification Agent Indications:    Abnormal ECG R94.31  History:        Patient has no prior history of Echocardiogram examinations.                 Risk Factors:Hypertension.  Sonographer:    Darlys Gales Referring Phys: [id number] OMAR M ALBUSTAMI IMPRESSIONS  1. Left ventricular ejection fraction, by estimation, is 60 to 65%. The left ventricle has normal function. The left ventricle has no regional wall motion abnormalities. There is mild concentric left ventricular hypertrophy. Left ventricular diastolic function could not be evaluated.  2. Right ventricular systolic function is normal. The right ventricular size is normal.  3. The mitral valve is normal in structure. No evidence of mitral valve regurgitation. No evidence of mitral stenosis.  4. The aortic valve is tricuspid. Aortic valve regurgitation is not visualized. Aortic valve sclerosis/calcification is  present, without any evidence of aortic stenosis. Aortic valve area, by VTI measures 3.42 cm. Aortic valve mean gradient measures 4.0 mmHg. Aortic valve Vmax measures 1.31 m/s. FINDINGS  Left Ventricle: Left ventricular ejection fraction, by estimation, is 60 to 65%. The left ventricle has normal function. The left ventricle has no regional wall motion abnormalities. The left ventricular internal cavity size was normal in size. There is  mild concentric left ventricular hypertrophy. Left ventricular diastolic function could not be evaluated. Right Ventricle: The right ventricular size is normal. No increase in right ventricular wall thickness. Right ventricular systolic function is normal. Left Atrium: Left atrial size was normal in size. Right Atrium: Right atrial size was normal in size. Pericardium: There is no evidence of pericardial effusion. Mitral Valve: The mitral valve is normal in  structure. Mild mitral annular calcification. No evidence of mitral valve regurgitation. No evidence of mitral valve stenosis. Tricuspid Valve: The tricuspid valve is normal in structure. Tricuspid valve regurgitation is not demonstrated. No evidence of tricuspid stenosis. Aortic Valve: The aortic valve is tricuspid. Aortic valve regurgitation is not visualized. Aortic valve sclerosis/calcification is present, without any evidence of aortic stenosis. Aortic valve mean gradient measures 4.0 mmHg. Aortic valve peak gradient measures 6.9 mmHg. Aortic valve area, by VTI measures 3.42 cm. Pulmonic Valve: The pulmonic valve was normal in structure. Pulmonic valve regurgitation is not visualized. No evidence of pulmonic stenosis. Aorta: The aortic root is normal in size and structure. Venous: The inferior vena cava was not well visualized. IAS/Shunts: No atrial level shunt detected by color flow Doppler.  LEFT VENTRICLE PLAX 2D LVIDd:         5.00 cm   Diastology LVIDs:         3.50 cm   LV e' medial:   7.18 cm/s LV PW:         1.40 cm   LV E/e' medial: 11.7 LV IVS:        1.30 cm LVOT diam:     2.25 cm LV SV:         71 LV SV Index:   29 LVOT Area:     3.98 cm  RIGHT VENTRICLE RV S prime:     15.40 cm/s TAPSE (M-mode): 1.8 cm LEFT ATRIUM           Index LA Vol (A2C): 35.4 ml 14.57 ml/m LA Vol (A4C): 33.9 ml 13.95 ml/m  AORTIC VALVE AV Area (Vmax):    3.19 cm AV Area (Vmean):   3.21 cm AV Area (VTI):     3.42 cm AV Vmax:           131.00 cm/s AV Vmean:          85.900 cm/s AV VTI:            0.207 m AV Peak Grad:      6.9 mmHg AV Mean Grad:      4.0 mmHg LVOT Vmax:         105.00 cm/s LVOT Vmean:        69.400 cm/s LVOT VTI:          0.178 m LVOT/AV VTI ratio: 0.86  AORTA Ao Root diam: 3.50 cm MITRAL VALVE MV Area (PHT): 3.45 cm    SHUNTS MV Decel Time: 220 msec    Systemic VTI:  0.18 m MV E velocity: 83.70 cm/s  Systemic Diam: 2.25 cm MV A velocity: 96.70 cm/s MV E/A ratio:  0.87 Armanda Magic MD Electronically  signed by Armanda Magic MD Signature Date/Time: 10/31/2023/1:56:40 PM    Final    Recent Labs    11/09/23 0518  WBC 4.2  HGB 10.7*  HCT 30.4*  PLT 150   Recent Labs    11/08/23 0431 11/09/23 0518  NA 135 133*  K 4.4 4.0  CL 111 108  CO2 19* 21*  GLUCOSE 91 96  BUN 8 7  CREATININE 0.38* 0.53*  CALCIUM 8.6* 8.5*    Intake/Output Summary (Last 24 hours) at 11/09/2023 0958 Last data filed at 11/09/2023 0853 Gross per 24 hour  Intake 474 ml  Output --  Net 474 ml     Pressure Injury 11/08/23 Heel Right Deep Tissue Pressure Injury - Purple or maroon localized area of discolored intact skin or blood-filled blister due to damage of underlying soft tissue from pressure and/or shear. (Active)  11/08/23 1600  Location: Heel  Location Orientation: Right  Staging: Deep Tissue Pressure Injury - Purple or maroon localized area of discolored intact skin or blood-filled blister due to damage of underlying soft tissue from pressure and/or shear.  Wound Description (Comments):   Present on Admission: Yes    Physical Exam: Vital Signs Blood pressure (!) 116/54, pulse 78, temperature 98.9 F (37.2 C), temperature source Oral, resp. rate 18, height 5\' 11"  (1.803 m), weight 122 kg, SpO2 93%. Constitutional: No apparent distress. Appropriate appearance for age.  Morbidly obese.  Soiled. HENT: No JVD. Neck Supple. Trachea midline. Atraumatic, normocephalic. Eyes: PERRLA. EOMI. Visual fields grossly intact.  Cardiovascular: RRR, no murmurs/rub/gallops. 1+ bilateral peripheral edema, with wrinkling indicating decreased edema. Respiratory: CTAB. No rales, rhonchi, or wheezing. On RA.  Abdomen: + bowel sounds, normoactive. No distention or tenderness.  GU: Not examined.  Tea-colored urine at bedside. Skin: C/D/I. No apparent lesions. + Bilateral chronic lower extremity skin thickening and discolorations; no open areas.  MSK:      No apparent deformity.      Strength: Antigravity and against  resistance all 4 extremities, 4 out of 5  Neurologic exam:  Cognition: [name] to person, place, time + Cognitive slowing, poor insight, memory deficit Language: Fluent, No substitutions or neoglisms. No dysarthria. Names 3/3 objects correctly.  Insight: Poor insight into current condition.  Mood: Pleasant affect, appropriate mood.  Sensation: Equal and intact in BL UE and Les.  Reflexes: 2+ in BL UE and LEs. Negative Hoffman's and babinski signs bilaterally.  CN: 2-12 grossly intact.  Coordination: No apparent tremors.  Mild bilateral upper extremity ataxia on finger-to-nose. Spasticity: MAS 0 in all extremities.         Assessment/Plan: 1. Functional deficits which require 3+ hours per day of interdisciplinary therapy in a comprehensive inpatient rehab setting. Physiatrist is providing close team supervision and 24 hour management of active medical problems listed below. Physiatrist and rehab team continue to assess barriers to discharge/monitor patient progress toward functional and medical goals  Care Tool:  Bathing              Bathing assist       Upper Body Dressing/Undressing Upper body dressing        Upper body assist      Lower Body Dressing/Undressing Lower body dressing            Lower body assist       Toileting Toileting    Toileting assist       Transfers Chair/bed transfer  Transfers assist  Locomotion Ambulation   Ambulation assist              Walk 10 feet activity   Assist           Walk 50 feet activity   Assist           Walk 150 feet activity   Assist           Walk 10 feet on uneven surface  activity   Assist           Wheelchair     Assist               Wheelchair 50 feet with 2 turns activity    Assist            Wheelchair 150 feet activity     Assist          Blood pressure (!) 116/54, pulse 78, temperature 98.9 F (37.2 C),  temperature source Oral, resp. rate 18, height 5\' 11"  (1.803 m), weight 122 kg, SpO2 93%.  Medical Problem List and Plan: 1. Functional deficits secondary to acute encephalopathy, hepatic and possible metabolic component             -patient may shower             -ELOS/Goals: 10-14 days S             - Stable to continue CIR  2.  Antithrombotics: -DVT/anticoagulation:  Pharmaceutical: Lovenox             -antiplatelet therapy: N/A 3.Left knee pain: XR ordered, voltaren gel ordered   - 11/21: No complaints today. 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  May benefit from hinged knee brace when weightbearing, or injection if medically stable.  Monitor today for therapy assessments.  4. Mood/Behavior/Sleep: Provide emotional support             -antipsychotic agents: N/A 5. Neuropsych/cognition: This patient is NOT capable of making decisions on his own behalf. 6. Skin/Wound Care: Routine skin checks..Excoriation of buttocks with wound care as directed -Low-air-loss mattress, Mepilex dressing to bilateral heels to prevent breakdown  7. Fluids/Electrolytes/Nutrition: Routine in and outs with follow-up chemistries  -11-21: Sodium 133, repeat in a.m. for trend.  Otherwise labs stable.  8.  Colitis versus ileitis-acute appendicitis ruled out.  Follow-up outpatient general surgery 9.  Hypertension.  Blood pressure remains a bit soft remains on ProAmatine 10 mg 3 times daily as well as low-dose Coreg 12.5 mg twice daily.   - 11/21: Diastolic hypotension as below; resume midodrine 5 mg 2 times daily and wean as tolerated     11/09/2023    3:54 AM 11/08/2023    7:39 PM 11/08/2023    3:53 PM  Vitals with BMI  Height   5\' 11"   Weight   268 lbs 15 oz  BMI   37.53  Systolic 116 111 295  Diastolic 54 56 58  Pulse 78 75 74    10.  Chronic lymphedema.  Follow-up outpatient. 11.History of alcohol use.  Counseling 12.  Tobacco use.  Counseling 13.   Obesity.  BMI 37.08.  Dietary follow-up 14.  Prediabetes.  Hemoglobin A1c 5.8. d/c ISS and start metformin ER release with dinner, creatinine reviewed and is stable -Blood sugars well-controlled, DC BG checks Recent Labs    11/08/23 2350 11/09/23 0352 11/09/23 1207  GLUCAP 112* 99 98     15.  BPH.  Flomax 0.4 mg  daily. BP reviewed and is stable   16. Hyperammonemia.  Hepatic steatosis seen on intake CT.  - 11/21: Seems more encephalopathic today, last ammonia was increased from 40s to 60s, add on to a.m. labs and resume lactulose 7.5 mg twice daily if worsening  17. Anemia.  hemoglobin decreased on intake from 12,6 to 10.7.  FOBT, H&H in AM.  LOS: 1 days A FACE TO FACE EVALUATION WAS PERFORMED  Angelina Sheriff 11/09/2023, 9:58 AM

## 2023-11-09 NOTE — Discharge Instructions (Addendum)
Inpatient Rehab Discharge Instructions  Brian [name] Discharge date and time: No discharge date for patient encounter.   Activities/Precautions/ Functional Status: Activity: activity as tolerated Diet: regular diet/no artificial sweeteners Wound Care: Routine skin checks Functional status:  ___ No restrictions     ___ Walk up steps independently ___ 24/7 supervision/assistance   ___ Walk up steps with assistance ___ Intermittent supervision/assistance  ___ Bathe/dress independently ___ Walk with walker     _x__ Bathe/dress with assistance ___ Walk Independently    ___ Shower independently ___ Walk with assistance    ___ Shower with assistance ___ No alcohol     ___ Return to work/school ________  Special Instructions: No driving smoking or alcohol   COMMUNITY REFERRALS UPON DISCHARGE:    Home Health:   PT      OT      ST                Agency:  Phone:     Medical Equipment/Items Ordered:tub transfer bench (charity DME/be sure you have financial assistance application to send back to agency within 30 days)                                                 Agency/Supplier: [employer] [phone]     Apply Xeroform gauze to right heel daily and cover with foam dressing.  Change foam dressing every 3 days or as needed.  Use Prevalon boot to reduce pressure to the heel when in bed  Cleanse buttocks/upper posterior thighs with VASHE wound care cleanser apply Gerhardt Butt cream to entire area 3 times a day as needed soiling.   My questions have been answered and I understand these instructions. I will adhere to these goals and the provided educational materials after my discharge from the hospital.  Patient/Caregiver Signature _______________________________ Date __________  Clinician Signature _______________________________________ Date __________  Please bring this form and your medication list with you to all your follow-up doctor's appointments.

## 2023-11-09 NOTE — Evaluation (Signed)
Physical Therapy Assessment and Plan  Patient Details  Name: Brian [name] MRN: [id number] Date of Birth: 1961/05/29  PT Diagnosis: Abnormality of gait, Cognitive deficits, Difficulty walking, Muscle weakness, Osteoarthritis, and Pain in joint Rehab Potential: Good ELOS: 3 weeks   Today's Date: 11/09/2023 PT Individual Time: 1310-1420 PT Individual Time Calculation (min): 70 min    Hospital Problem: Principal Problem:   Acute metabolic encephalopathy   Past Medical History:  Past Medical History:  Diagnosis Date   Alcoholic pancreatitis    ETOH abuse    HTN (hypertension)    Hypertension    Morbid obesity (HCC)    Past Surgical History: No past surgical history on file.  Assessment & Plan Clinical Impression: Patient is a 62 year old right-handed male with history of obesity BMI 38.8, fatty liver, alcohol abuse with alcoholic pancreatitis,chronic lymphedema and hypertension as well as tobacco use. Per chart review patient lives with spouse. Two-level home bed and bath on main level with one-step to entry. Patient was able to ambulate short distances around the house with a rolling walker sometimes needed. Wheelchair for community distances. Independent with basic ADLs. Presented 10/30/2023 after he was found by his wife on the couch in his own urine and feces with altered mental status. With EMS he was noted to be tachycardic and warm to touch. His oxygen saturation are 87%. He did receive 4 mg of IV Ativan due to concerns for possible DTs. CT/MRI of the brain showed no acute reversible findings. CT of the chest abdomen pelvis findings concerning for potential acute appendicitis given the dilated inflamed appearing appendix containing multiple appendicoliths. Diffuse dilatation of the small bowel and colon suggesting possible ileus. No definite acute findings in the thorax. Hepatomegaly with severe hepatic steatosis. 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 Chemistries blood cultures no growth to date, potassium 3.0, glucose 152, BUN 25, AST 44, total bilirubin 9.4, troponin negative, lipase 35, alcohol negative, hemoglobin A1c 5.8, ammonia level 42, lactic acid 3.8-0.8. General surgery was consulted for low suspicion of appendicitis and advised to continue to monitor. He did remain on empiric antibiotics for short time. Placed on lactulose for elevated ammonia levels and continued to be monitored. Blood pressures remained a bit soft maintained on ProAmatine. Lovenox added for DVT prophylaxis. Mechanical soft diet with thin liquids. Therapy evaluations completed due to patient decreased functional mobility/acute encephalopathy was admitted for a comprehensive rehab program.   Patient currently requires mod with mobility secondary to muscle weakness, decreased cardiorespiratoy endurance, impaired timing and sequencing and decreased coordination, decreased memory, and decreased sitting balance, decreased standing balance, decreased postural control, and decreased balance strategies.  Prior to hospitalization, patient was modified independent  with mobility and lived with Spouse, Son in a House home.  Home access is 1 step in front of house, level entry through garage in back of houseStairs to enter.  Patient will benefit from skilled PT intervention to maximize safe functional mobility, minimize fall risk, and decrease caregiver burden for planned discharge home with 24 hour supervision.  Anticipate patient will benefit from follow up HH at discharge.  PT - End of Session Activity Tolerance: Tolerates 30+ min activity with multiple rests Endurance Deficit: Yes Endurance Deficit Description: rest breaks with mobility PT Assessment Rehab Potential (ACUTE/IP ONLY): Good PT Barriers to Discharge: Decreased caregiver support;Inaccessible home environment;Home environment access/layout PT Barriers to Discharge Comments: wife unable  to physically assist, 1 STE PT Patient demonstrates impairments in the  following area(s): Balance;Endurance;Pain;Safety;Skin Integrity;Motor PT Transfers Functional Problem(s): Bed Mobility;Bed to Chair PT Locomotion Functional Problem(s): Ambulation;Wheelchair Mobility;Stairs PT Plan PT Intensity: Minimum of 1-2 x/day ,45 to 90 minutes PT Frequency: 5 out of 7 days PT Duration Estimated Length of Stay: 3 weeks PT Treatment/Interventions: Ambulation/gait training;Balance/vestibular training;DME/adaptive equipment instruction;Psychosocial support;Skin care/wound management;UE/LE Strength taining/ROM;UE/LE Coordination activities;Cognitive remediation/compensation;Functional mobility training;Splinting/orthotics;Visual/perceptual remediation/compensation;Community reintegration;Neuromuscular re-education;Stair training;Wheelchair propulsion/positioning;Discharge planning;Pain management;Therapeutic Activities;Disease management/prevention;Patient/family education;Therapeutic Exercise PT Transfers Anticipated Outcome(s): CGA PT Locomotion Anticipated Outcome(s): CGA ambulatory PT Recommendation Recommendations for Other Services: None Follow Up Recommendations: Home health PT Patient destination: Home Equipment Recommended: To be determined Equipment Details: owns RW only   PT Evaluation Precautions/Restrictions Restrictions Weight Bearing Restrictions: No Pain Interference Pain Interference Pain Effect on Sleep: 1. Rarely or not at all Pain Interference with Therapy Activities: 1. Rarely or not at all Pain Interference with Day-to-Day Activities: 1. Rarely or not at all Home Living/Prior Functioning Home Living Available Help at Discharge: Family;Available 24 hours/day (states wife will be present 24/7 but will be unable to provide physical assist) Type of Home: House Home Access: Stairs to enter Entrance Stairs-Number of Steps: 1 step in front of house, level entry through garage in  back of house Entrance Stairs-Rails: None Home Layout: 1/2 bath on main level;Two level;Other (Comment) Alternate Level Stairs-Number of Steps: flight Alternate Level Stairs-Rails: Can reach both Bathroom Shower/Tub: Sponge bathes at baseline Bathroom Toilet: Standard Bathroom Accessibility: Yes  Lives With: Spouse;Son Prior Function Level of Independence: Needs assistance with ADLs;Needs assistance with homemaking;Requires assistive device for independence  Able to Take Stairs?: No Driving: No Vocation: Unemployed Cognition Orientation Level: Oriented to person;Oriented to place;Oriented to situation Year: 2024 Month: October Day of Week: Incorrect Sensation Coordination Gross Motor Movements are Fluid and Coordinated: No Fine Motor Movements are Fluid and Coordinated: No Coordination and Movement Description: some decreased smoothness at accuracy Motor  Motor Motor: Other (comment) Motor - Skilled Clinical Observations: generalized weakness  Trunk/Postural Assessment  Cervical Assessment Cervical Assessment: Within Functional Limits Thoracic Assessment Thoracic Assessment: Exceptions to [facility] (rounded shoulders) Lumbar Assessment Lumbar Assessment: Exceptions to [facility] (posterior pelvic tilt) Postural Control Postural Control: Deficits on evaluation Righting Reactions: delayed and inadequate Protective Responses: decreased Postural Limitations: decreased  Balance Balance Balance Assessed: Yes Static Sitting Balance Static Sitting - Balance Support: Feet supported Static Sitting - Level of Assistance: 5: Stand by assistance Dynamic Sitting Balance Dynamic Sitting - Balance Support: Feet supported Dynamic Sitting - Level of Assistance: 5: Stand by assistance Static Standing Balance Static Standing - Balance Support: Bilateral upper extremity supported;No upper extremity supported Static Standing - Level of Assistance: 3: Mod assist Extremity Assessment  RLE  Assessment RLE Assessment: Exceptions to [facility] RLE Strength Right Hip Flexion: 2+/5 Right Hip Extension: 3-/5 (tested in supine against manual resistance) Right Knee Flexion: 3+/5 Right Knee Extension: 2+/5 Right Ankle Dorsiflexion: 3/5 Right Ankle Plantar Flexion: 2+/5 (tested in supine against manual resistance) LLE Assessment LLE Assessment: Exceptions to [facility] LLE Strength Left Hip Flexion: 3-/5 Left Hip Extension: 3-/5 (tested in supine against manual resistance) Left Knee Flexion: 3+/5 Left Knee Extension: 3-/5 Left Ankle Dorsiflexion: 3+/5 Left Ankle Plantar Flexion: 2+/5 (tested in supine against manual resistance)  Care Tool Care Tool Bed Mobility Roll left and right activity   Roll left and right assist level: Minimal Assistance - Patient > 75%    Sit to lying activity   Sit to lying assist level: Minimal Assistance - Patient > 75%    Lying to sitting  on side of bed activity   Lying to sitting on side of bed assist level: the ability to move from lying on the back to sitting on the side of the bed with no back support.: Minimal Assistance - Patient > 75%     Care Tool Transfers Sit to stand transfer   Sit to stand assist level: Moderate Assistance - Patient 50 - 74%    Chair/bed transfer   Chair/bed transfer assist level: Dependent - Armed forces operational officer transfer activity did not occur: Safety/medical concerns        Care Tool Locomotion Ambulation Ambulation activity did not occur: Safety/medical concerns        Walk 10 feet activity Walk 10 feet activity did not occur: Safety/medical concerns       Walk 50 feet with 2 turns activity Walk 50 feet with 2 turns activity did not occur: Safety/medical concerns      Walk 150 feet activity Walk 150 feet activity did not occur: Safety/medical concerns      Walk 10 feet on uneven surfaces activity Walk 10 feet on uneven surfaces activity did not occur: Safety/medical concerns      Stairs Stair  activity did not occur: Safety/medical concerns        Walk up/down 1 step activity Walk up/down 1 step or curb (drop down) activity did not occur: Safety/medical concerns      Walk up/down 4 steps activity Walk up/down 4 steps activity did not occur: Safety/medical concerns      Walk up/down 12 steps activity Walk up/down 12 steps activity did not occur: Safety/medical concerns      Pick up small objects from floor Pick up small object from the floor (from standing position) activity did not occur: Safety/medical concerns      Wheelchair Is the patient using a wheelchair?: Yes Type of Wheelchair: Manual   Wheelchair assist level: Dependent - Patient 0% Max wheelchair distance: 150  Wheel 50 feet with 2 turns activity   Assist Level: Dependent - Patient 0%  Wheel 150 feet activity   Assist Level: Dependent - Patient 0%    Refer to Care Plan for Long Term Goals  SHORT TERM GOAL WEEK 1 PT Short Term Goal 1 (Week 1): Pt will complete sit<>stand with mod assist to LRAD PT Short Term Goal 2 (Week 1): Pt will complete stand pivot transfer with LRAD and mod assist PT Short Term Goal 3 (Week 1): Pt will ambulate with LRAD 10' with mod assist and +2 for WC follow  Recommendations for other services: None   Skilled Therapeutic Intervention Evaluation completed (see details above and below) with education on PT POC and goals and individual treatment initiated with focus on transfer training, NMR for weightshifting, and education on DME. Pt agreeable to PT, denies pain during session. Pt HR monitored throughout session at rest and with activity demonstrating HR 75-85 bpm. Pt completes bed mobility with light min assist supine to sit EOB. Pt completes sit<>stand from elevated bed to stedy with CGA. Pt then completes transfer via stedy to [facility] with min assist for controlling descent. Pt stands from stedy with mod assist pulling to upright. Pt transported via WC to main gym. Pt completes stand  with heavy mod assist to RW, requires mod assist for standing balance with BUE support on RW with pt demonstrating posterior lean. Pt returns to sitting, attempts x2 more trials however unable to achieve full upright. Pt positioned in //bars to  complete transfer training and NMR in standing to promote weightshifting bilaterally. Pt completes x5 sit<>stands with min assist for anterior wt shift. With each stand pt attempt to lift unilateral LE. Pt demonstrates good foot clearance on RLE, unable to clear LLE from ground. Pt able to maintain standing ~30 seconds with each trial. Pt returns to sitting in WC at end of activity. Pt returns to room and remains seated in [facility] with all needs within reach, cal light in place and chair alarm donned and activated at end of session. Pt RN notified of pt requesting voltaren gel for BLEs, nurse tech notified of pt position and transfer method with both verbalizing understanding.  Mobility Bed Mobility Bed Mobility: Rolling Right;Rolling Left Rolling Right: Minimal Assistance - Patient > 75% Rolling Left: Minimal Assistance - Patient > 75% Supine to Sit: Minimal Assistance - Patient > 75% Transfers Transfers: Transfer via Theme park manager: Musician: Yes Wheelchair Assistance: Dependent - Patient 0%   Discharge Criteria: Patient will be discharged from PT if patient refuses treatment 3 consecutive times without medical reason, if treatment goals not met, if there is a change in medical status, if patient makes no progress towards goals or if patient is discharged from hospital.  The above assessment, treatment plan, treatment alternatives and goals were discussed and mutually agreed upon: by patient  Edwin Cap PT, DPT 11/09/2023, 3:41 PM

## 2023-11-09 NOTE — Discharge Summary (Signed)
Physician Discharge Summary  Patient ID: Brian [name] MRN: [id number] DOB/AGE: 1961-11-02 62 y.o.  Admit date: 11/08/2023 Discharge date: 11/28/2023  Discharge Diagnoses:  Principal Problem:   Acute metabolic encephalopathy Active Problems:   Insomnia   Cognitive retention disorder DVT prophylaxis Colitis versus ileitis Hypertension Chronic lymphedema History of alcohol and tobacco use Obesity Prediabetes BPH Right heel pressure ulcer Hyperammonia   Discharged Condition: Stable  Significant Diagnostic Studies: DG Abd 1 View  Result Date: 11/12/2023 CLINICAL DATA:  [id number] Incontinence of bowel [id number] EXAM: ABDOMEN - 1 VIEW COMPARISON:  October 31, 2023 FINDINGS: Evaluation is limited by underpenetration. No dilated loops of bowel are seen. Air is visualized in the rectum. Vascular calcifications. Bibasilar platelike opacities, favored atelectasis. IMPRESSION: Nonobstructive bowel gas pattern. Electronically Signed   By: Meda Klinefelter M.D.   On: 11/12/2023 14:02   DG Knee 1-2 Views Left  Result Date: 11/08/2023 CLINICAL DATA:  Bilateral knee pain. Chronic lower extremity lymphedema. EXAM: LEFT KNEE - 1-2 VIEW COMPARISON:  None Available. FINDINGS: Moderate to severe medial compartment joint space narrowing and peripheral osteophytosis. Mild peripheral lateral compartment degenerative osteophytosis without joint space narrowing. Moderate to severe patellofemoral joint space narrowing with mild-to-moderate peripheral osteophytes. Mild chronic enthesopathic change at the quadriceps and patellar tendon insertions on the patella. No significant joint effusion. No acute fracture or dislocation. Moderate atherosclerotic calcifications. 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 Electronically Signed   By: Neita Garnet M.D.   On: 11/08/2023 14:00   MR BRAIN WO CONTRAST  Result Date: 10/31/2023 CLINICAL DATA:  Mental status change of  unknown cause. Difficulty holding still. EXAM: MRI HEAD WITHOUT CONTRAST TECHNIQUE: Multiplanar, multiecho pulse sequences of the brain and surrounding structures were obtained without intravenous contrast. COMPARISON:  Head CT earlier same day FINDINGS: Brain: The study suffers from motion degradation. No diffusion abnormality is identified, though motion degradation could obscure a subtle insult. Brain otherwise appears similarly normal without evidence of stroke, mass, hemorrhage, hydrocephalus or extra-axial collection. Because of the motion, a subtle lesion could be inapparent. Vascular: Major vessels at the base of the brain show flow. Skull and upper cervical spine: Negative Sinuses/Orbits: Sinuses are clear except for a small amount of fluid layering in the sphenoid sinus. Orbits negative. Other: None IMPRESSION: 1. Motion degraded study. No acute or reversible finding. Because of the motion, a subtle lesion could be inapparent. 2. Small amount of fluid layering in the sphenoid sinus. Electronically Signed   By: Paulina Fusi M.D.   On: 10/31/2023 17:19   DG Abd 1 View  Result Date: 10/31/2023 CLINICAL DATA:  NG tube placement EXAM: ABDOMEN - 1 VIEW COMPARISON:  CT 10/31/2023 FINDINGS: Esophageal tube tip overlies the gastric fundus. Air distended large and small bowel in the upper abdomen. IMPRESSION: Esophageal tube tip overlies the gastric fundus. Electronically Signed   By: Jasmine Pang M.D.   On: 10/31/2023 16:27   ECHOCARDIOGRAM COMPLETE  Result Date: 10/31/2023    ECHOCARDIOGRAM REPORT   Patient Name:   Brian [name] Date of Exam: 10/31/2023 Medical Rec #:  [id number]       Height:       72.0 in Accession #:    [id number]      Weight:       272.5 lb Date of Birth:  1961/05/23      BSA:          2.430 m Patient Age:    62 years  BP:           139/80 mmHg Patient Gender: M               HR:           94 bpm. Exam Location:  Inpatient Procedure: 2D Echo, Cardiac Doppler, Color Doppler  and Intracardiac            Opacification Agent Indications:    Abnormal ECG R94.31  History:        Patient has no prior history of Echocardiogram examinations.                 Risk Factors:Hypertension.  Sonographer:    Darlys Gales Referring Phys: [id number] OMAR M ALBUSTAMI IMPRESSIONS  1. Left ventricular ejection fraction, by estimation, is 60 to 65%. The left ventricle has normal function. The left ventricle has no regional wall motion abnormalities. There is mild concentric left ventricular hypertrophy. Left ventricular diastolic function could not be evaluated.  2. Right ventricular systolic function is normal. The right ventricular size is normal.  3. The mitral valve is normal in structure. No evidence of mitral valve regurgitation. No evidence of mitral stenosis.  4. The aortic valve is tricuspid. Aortic valve regurgitation is not visualized. Aortic valve sclerosis/calcification is present, without any evidence of aortic stenosis. Aortic valve area, by VTI measures 3.42 cm. Aortic valve mean gradient measures 4.0 mmHg. Aortic valve Vmax measures 1.31 m/s. FINDINGS  Left Ventricle: Left ventricular ejection fraction, by estimation, is 60 to 65%. The left ventricle has normal function. The left ventricle has no regional wall motion abnormalities. The left ventricular internal cavity size was normal in size. There is  mild concentric left ventricular hypertrophy. Left ventricular diastolic function could not be evaluated. Right Ventricle: The right ventricular size is normal. No increase in right ventricular wall thickness. Right ventricular systolic function is normal. Left Atrium: Left atrial size was normal in size. Right Atrium: Right atrial size was normal in size. Pericardium: There is no evidence of pericardial effusion. Mitral Valve: The mitral valve is normal in structure. Mild mitral annular calcification. No evidence of mitral valve regurgitation. No evidence of mitral valve stenosis. Tricuspid  Valve: The tricuspid valve is normal in structure. Tricuspid valve regurgitation is not demonstrated. No evidence of tricuspid stenosis. Aortic Valve: The aortic valve is tricuspid. Aortic valve regurgitation is not visualized. Aortic valve sclerosis/calcification is present, without any evidence of aortic stenosis. Aortic valve mean gradient measures 4.0 mmHg. Aortic valve peak gradient measures 6.9 mmHg. Aortic valve area, by VTI measures 3.42 cm. Pulmonic Valve: The pulmonic valve was normal in structure. Pulmonic valve regurgitation is not visualized. No evidence of pulmonic stenosis. Aorta: The aortic root is normal in size and structure. Venous: The inferior vena cava was not well visualized. IAS/Shunts: No atrial level shunt detected by color flow Doppler.  LEFT VENTRICLE PLAX 2D LVIDd:         5.00 cm   Diastology LVIDs:         3.50 cm   LV e' medial:   7.18 cm/s LV PW:         1.40 cm   LV E/e' medial: 11.7 LV IVS:        1.30 cm LVOT diam:     2.25 cm LV SV:         71 LV SV Index:   29 LVOT Area:     3.98 cm  RIGHT VENTRICLE RV S prime:  15.40 cm/s TAPSE (M-mode): 1.8 cm LEFT ATRIUM           Index LA Vol (A2C): 35.4 ml 14.57 ml/m LA Vol (A4C): 33.9 ml 13.95 ml/m  AORTIC VALVE AV Area (Vmax):    3.19 cm AV Area (Vmean):   3.21 cm AV Area (VTI):     3.42 cm AV Vmax:           131.00 cm/s AV Vmean:          85.900 cm/s AV VTI:            0.207 m AV Peak Grad:      6.9 mmHg AV Mean Grad:      4.0 mmHg LVOT Vmax:         105.00 cm/s LVOT Vmean:        69.400 cm/s LVOT VTI:          0.178 m LVOT/AV VTI ratio: 0.86  AORTA Ao Root diam: 3.50 cm MITRAL VALVE MV Area (PHT): 3.45 cm    SHUNTS MV Decel Time: 220 msec    Systemic VTI:  0.18 m MV E velocity: 83.70 cm/s  Systemic Diam: 2.25 cm MV A velocity: 96.70 cm/s MV E/A ratio:  0.87 Armanda Magic MD Electronically signed by Armanda Magic MD Signature Date/Time: 10/31/2023/1:56:40 PM    Final    EEG adult  Result Date: 10/31/2023 Charlsie Quest,  MD     10/31/2023 12:10 PM Patient Name: Brian [name] MRN: [id number] Epilepsy Attending: Charlsie Quest Referring Physician/Provider: Rocky Morel, DO Date: 10/31/2023 Duration: 23.56 mins Patient history: 62yo M with ams getting eeg to evaluate for seizure Level of alertness: Awake/ lethargic AEDs during EEG study: Ativan Technical aspects: This EEG study was done with scalp electrodes positioned according to the 10-20 International system of electrode placement. Electrical activity was reviewed with band pass filter of 1-70Hz , sensitivity of 7 uV/mm, display speed of 51mm/sec with a 60Hz  notched filter applied as appropriate. EEG data were recorded continuously and digitally stored.  Video monitoring was available and reviewed as appropriate. Description: No posterior dominant rhythm was noted. EEG showed continuous generalized 3 to 5 Hz theta-delta slowing, at times with triphasic morphology. Hyperventilation and photic stimulation were not performed.   ABNORMALITY - Continuous slow, generalized IMPRESSION: This study is suggestive of moderate to severe diffuse encephalopathy. No seizures or epileptiform discharges were seen throughout the recording. Charlsie Quest   CT CHEST ABDOMEN PELVIS W CONTRAST  Result Date: 10/31/2023 CLINICAL DATA:  62 year old male with history of sepsis. EXAM: CT CHEST, ABDOMEN, AND PELVIS WITH CONTRAST TECHNIQUE: Multidetector CT imaging of the chest, abdomen and pelvis was performed following the standard protocol during bolus administration of intravenous contrast. RADIATION DOSE REDUCTION: This exam was performed according to the departmental dose-optimization program which includes automated exposure control, adjustment of the mA and/or kV according to patient size and/or use of iterative reconstruction technique. CONTRAST:  ISOVUE-370 IOPAMIDOL (ISOVUE-370) INJECTION 76% COMPARISON:  CT of the chest, abdomen and pelvis 03/31/2023. FINDINGS: CT CHEST FINDINGS  Cardiovascular: Heart size is mildly enlarged. There is no significant pericardial fluid, thickening or pericardial calcification. There is aortic atherosclerosis, as well as atherosclerosis of the great vessels of the mediastinum and the coronary arteries, including calcified atherosclerotic plaque in the left anterior descending and right coronary arteries. Mediastinum/Nodes: No pathologically enlarged mediastinal or hilar lymph nodes. Mass-like enlargement and heterogeneous enhancement of the left lobe of the thyroid gland which measures at least  4.0 x 4.5 cm (axial image 5 of series 3), similar to the prior study. Esophagus is unremarkable in appearance. No axillary lymphadenopathy. Lungs/Pleura: Elevation of the right hemidiaphragm. Extensive linear architectural distortion throughout the lung bases bilaterally, increased compared to the prior study, likely combination of progressive chronic scarring and worsening areas of atelectasis. No confluent consolidative airspace disease. No pleural effusions. No definite suspicious appearing pulmonary nodules or masses confidently identified on today's motion limited examination. Musculoskeletal: There are no aggressive appearing lytic or blastic lesions noted in the visualized portions of the skeleton. CT ABDOMEN PELVIS FINDINGS Hepatobiliary: The liver is enlarged measuring 21.4 cm in craniocaudal span. Diffuse low attenuation throughout the hepatic parenchyma, indicative of a background of hepatic steatosis. No definite suspicious hepatic lesions. No intra or extrahepatic biliary ductal dilatation. Numerous partially calcified gallstones are noted within the lumen of the gallbladder. Gallbladder is completely decompressed around these indwelling stones. No definite pericholecystic fluid or focal surrounding inflammatory changes. Pancreas: No pancreatic mass. No pancreatic ductal dilatation. No pancreatic or peripancreatic fluid collections or inflammatory changes.  Spleen: Unremarkable. Adrenals/Urinary Tract: Nonobstructive calculi in the right renal collecting system measuring up to 5 mm in the interpolar region. Left kidney and bilateral adrenal glands are otherwise normal in appearance. No hydroureteronephrosis. Urinary bladder is unremarkable in appearance. Stomach/Bowel: The appearance of the stomach is normal. Colon is diffusely distended with a large volume of liquid stool. Multiple dilated loops of small bowel are also evident measuring up to 4 cm in diameter, with multiple air-fluid levels in the small bowel. Several colonic diverticuli are noted, without definite focal surrounding inflammatory changes to clearly indicate an acute diverticulitis at this time. The appendix appears dilated, with a thickened mildly inflamed wall and multiple appendicoliths concerning for possible acute appendicitis. The appendix measures up to 14 mm in diameter, and is located posterior to the cecum in the right lower quadrant best appreciated on axial image 78 of series 3. Vascular/Lymphatic: Atherosclerotic calcifications in the abdominal aorta and pelvic vasculature. Numerous prominent borderline enlarged retroperitoneal and upper abdominal lymph nodes are noted, nonspecific, but presumably reactive. Reproductive: Prostate gland and seminal vesicles are unremarkable in appearance. Other: Left inguinal hernia containing only fat. No significant volume of ascites. No pneumoperitoneum. Musculoskeletal: There are no aggressive appearing lytic or blastic lesions noted in the visualized portions of the skeleton. IMPRESSION: 1. Findings are concerning for potential acute appendicitis given the dilated inflamed appearing appendix containing multiple appendicoliths. 2. Diffuse dilatation of the small bowel and colon, which may suggest an associated ileus, or could be indicative of enteritis/colitis. 3. No definite acute findings in the thorax. 4. Hepatomegaly with severe hepatic steatosis. 5.  Aortic atherosclerosis, in addition to left anterior descending and right coronary artery disease. Please note that although the presence of coronary artery calcium documents the presence of coronary artery disease, the severity of this disease and any potential stenosis cannot be assessed on this non-gated CT examination. Assessment for potential risk factor modification, dietary therapy or pharmacologic therapy may be warranted, if clinically indicated. 6. 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 Recommend thyroid US (ref: J Am Coll Radiol. 2015 Feb;12(2): 143-50). 7. Additional incidental findings, as above. Electronically Signed   By: Trudie Reed M.D.   On: 10/31/2023 07:21   DG Chest Portable 1 View  Result Date: 10/31/2023 CLINICAL DATA:  Possible sepsis. EXAM: PORTABLE CHEST 1 VIEW COMPARISON:  March 31, 2023 FINDINGS: The heart size and  mediastinal contours are within normal limits. Low lung volumes are noted with mild elevation of the right hemidiaphragm. Mild atelectasis is suspected within the right lung base. No pleural effusion or pneumothorax is identified. Multilevel degenerative changes are seen throughout the thoracic spine. IMPRESSION: Low lung volumes with mild right basilar atelectasis. Electronically Signed   By: Aram Candela M.D.   On: 10/31/2023 04:34   CT Head Wo Contrast  Result Date: 10/31/2023 CLINICAL DATA:  Mental status change with unknown cause. Suspected sepsis. EXAM: CT HEAD WITHOUT CONTRAST TECHNIQUE: Contiguous axial images were obtained from the base of the skull through the vertex without intravenous contrast. RADIATION DOSE REDUCTION: This exam was performed according to the departmental dose-optimization program which includes automated exposure control, adjustment of the mA and/or kV according to patient size and/or use of iterative reconstruction technique. COMPARISON:  None Available. FINDINGS:  Brain: No evidence of acute infarction, hemorrhage, hydrocephalus, extra-axial collection or mass lesion/mass effect. Vascular: No hyperdense vessel or unexpected calcification. Skull: Normal. Negative for fracture or focal lesion. Sinuses/Orbits: Fluid level in the left sphenoid sinus where there is background wall sclerosis suggesting chronic sinusitis. IMPRESSION: 1. Normal appearance of the brain. 2. Small left sphenoid sinus fluid level. Electronically Signed   By: Tiburcio Pea M.D.   On: 10/31/2023 04:08    Labs:  Basic Metabolic Panel: Recent Labs  Lab 11/20/23 0719 11/23/23 1228  NA 139 135  K 4.0 4.2  CL 108 105  CO2 25 20*  GLUCOSE 108* 126*  BUN 10 11  CREATININE 0.47* 0.57*  CALCIUM 8.9 9.3    CBC: Recent Labs  Lab 11/20/23 0719 11/23/23 1228  WBC 4.2 4.0  NEUTROABS 2.6 2.7  HGB 10.4* 11.4*  HCT 31.0* 33.8*  MCV 100.0 99.1  PLT 169 198    CBG: No results for input(s): "GLUCAP" in the last 168 hours.   Brief HPI:   Brian [name] is a 62 y.o. right-handed male with history of obesity BMI 38.8 fatty liver, alcohol abuse with alcoholic pancreatitis, chronic lymphedema and hypertension as well as tobacco use.  Per chart review patient lives with spouse.  Two-level home bed and bath main level one-step to entry.  Patient was able to ambulate short distances around the house with a rolling walker sometimes needed.  Wheelchair for community distances.  Independent with basic ADLs.  Presented 10/30/2023 after he was found by his wife on the couch in his own urine and feces with altered mental status.  With EMS he was noted to be tachycardic and warm to touch.  His oxygen saturation is 87% room air.  He did receive 4 mg of IV Ativan due to concern for possible DTs.  CT/MRI of the brain showed no acute reversible findings.  CT of the chest abdomen and pelvis findings concerning for potential acute appendicitis given the dilated inflamed appearing appendix containing multiple  appendicoliths.  Diffuse dilatation of the small bowel and colon suggestive of possible ileus.  No definite acute findings in the thorax.  Hepatomegaly with severe hepatic steatosis.  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  Chemistries blood cultures no growth to date potassium 3.0 glucose 152, BUN 25, AST 44, total bilirubin 9.4, troponin negative, lipase 35, alcohol negative, hemoglobin A1c 5.8, ammonia level 42 lactic acid 3.8-0.8.  General surgery consulted for low suspicion of appendicitis advised to continue to monitor.  He did remain on empiric antibiotics for short time.  Placed on  lactulose for elevated ammonia levels and continue to be monitored.  Blood pressures remained a bit soft maintained on ProAmatine.  Lovenox added for DVT prophylaxis.  Mechanical soft diet with thin liquids.  Therapy evaluations completed due to patient decreased functional mobility acute encephalopathy was admitted for a comprehensive rehab program.   Hospital Course: Brian [name] was admitted to rehab 11/08/2023 for inpatient therapies to consist of PT, ST and OT at least three hours five days a week. Past admission physiatrist, therapy team and rehab RN have worked together to provide customized collaborative inpatient rehab.  Pertaining to patient's acute encephalopathy hepatic and possible metabolic component remained stable attending full therapies.  CT MRI unremarkable.  Lovenox for DVT prophylaxis.  He had some left knee pain with Voltaren gel added and x-ray showed moderate to severe medial patellofemoral compartment osteoarthritis.  There was some suggestion of colitis versus ileitis during hospital stay acute appendicitis ruled out he can follow-up outpatient general surgery as needed no nausea vomiting.  Blood pressure soft he was weaned from ProAmatine remained on low-dose Coreg twice daily and follow-up outpatient.  Chronic lymphedema with conservative care  follow-up outpatient.  He did have a history of tobacco alcohol use receiving counts regards to cessation of these products.  Obesity BMI 37.08 dietary follow-up.  Prediabetes hemoglobin A1c 5.8 consideration made on starting  metformin monitoring of renal function.  BPH Flomax 0.4 mg daily and voiding without difficulty.  Patient did have some excoriation to the buttocks and upper posterior thighs receiving routine skin care with Gerhard's Butt cream.  Right heel ulcer with wound care as directed.  Prevalon boot placed to reduce pressure to heel when in bed.  Hyperammonia 32-79 with hepatic steatosis seen on intake CT.  Monitoring of ammonia levels Chronulac as indicated.   Blood pressures were monitored on TID basis and soft and monitored  Diabetes has been monitored with ac/hs CBG checks and SSI was use prn for tighter BS control.    Rehab course: During patient's stay in rehab weekly team conferences were held to monitor patient's progress, set goals and discuss barriers to discharge. At admission, patient required max assist sit to stand minimal assist sit to supine  Physical exam.  Blood pressure 118/70 pulse 80 temperature 98 respirations 18 oxygen saturation is 92% room air Constitutional.  No acute distress HEENT Head.  Normocephalic and atraumatic Eyes.  Pupils round and reactive to light no discharge without nystagmus Neck.  Supple nontender no JVD without thyromegaly Cardiac regular rate and rhythm without any extra sounds or murmur heard Abdomen.  Soft nontender positive bowel sounds without rebound Respiratory effort normal no respiratory distress without wheeze Neurologic.  Alert resting comfortably.  Makes eye contact with examiner.  Follows commands.  Provides name and age but some limitations in medical historian. MSK.  Left knee swollen, TTP and limits his elevation of left lower extremity strength is otherwise intact  He/She  has had improvement in activity tolerance,  balance, postural control as well as ability to compensate for deficits. He/She has had improvement in functional use RUE/LUE  and RLE/LLE as well as improvement in awareness.  Patient making excellent overall progress requiring contact-guard minimal assist overall, contact-guard transfers rolling walker contact-guard ambulation 175 feet up-and-down 6 inch stairs with bilateral handrails with minimal assist.  Patient lower body bathing standing at sink for increased balance and endurance challenge.  Able to complete lower body bathing anterior posterior PERI care with close supervision using rolling walker for  balance maintaining standing balance.  Full family teaching completed plan discharge to home    Media Information   Document Information  Photos  Right heel  11/08/2023 16:25  Attached To:  Hospital Encounter on 11/08/23  Source Information  Nida, Felisa Bonier, RN  [facility]  Document History    Media Information   Document Information  Photos  Left heel  11/22/2023 13:18  Attached To:  Hospital Encounter on 11/08/23  Source Information  Pierre Bali, RN  [facility]  Document History      Media Information   Document Information  Photos  Right buttock  11/08/2023 16:42  Attached To:  Hospital Encounter on 11/08/23  Source Information  Nida, Felisa Bonier, RN  [facility]  Document History        Disposition: Discharge to home    Diet: No concentrated sweets  Special Instructions: No driving smoking or alcohol  Apply Xeroform gauze to right heel wound daily and cover with foam dressing.  Change foam dressing every 3 days or as needed.  Use Prevalon boot to reduce pressure to the heel when in bed  Cleanse buttocks/upper posterior thighs with VASHE wound care cleanser apply Gerhardt butt cream to entire area 3 times a day as needed soiling.  Sprinkled over Gerhard's with floor stock antifungal powder  Medications at  discharge. 1.  Coreg 12.5 mg p.o. twice daily 2.  Voltaren gel 4 g 4 times daily to affected area 3.  Folic acid 1 mg p.o. daily 4.  Multivitamin daily 5.  Flomax 0.4 mg daily 6.  Chronulac 10 g twice daily as needed 7.Thiamine 100 mg daily  30-35 minutes were spent completing discharge summary and discharge planning  Discharge Instructions     Ambulatory referral to Physical Therapy   Complete by: As directed    Lymphedema management        Follow-up Information     Angelina Sheriff, DO Follow up.   Specialty: Physical Medicine and Rehabilitation Why: No formal follow-up needed Contact information: [address] [place] [address] [phone]         Harriette Bouillon, MD Follow up.   Specialty: General Surgery Why: As needed Contact information: [address] [place] [address] [phone]                 Signed: Mcarthur Rossetti Phylicia Mcgaugh 11/27/2023, 5:09 AM

## 2023-11-09 NOTE — Plan of Care (Signed)
  Problem: RH Cognition - SLP Goal: RH LTG Patient will demonstrate orientation with cues Description:  LTG:  Patient will demonstrate orientation to person/place/time/situation with cues (SLP)   Flowsheets (Taken 11/09/2023 1554) LTG Patient will demonstrate orientation to:  Person  Place  Time  Situation LTG: Patient will demonstrate orientation using cueing (SLP): Supervision   Problem: RH Problem Solving Goal: LTG Patient will demonstrate problem solving for (SLP) Description: LTG:  Patient will demonstrate problem solving for basic/complex daily situations with cues  (SLP) Flowsheets (Taken 11/09/2023 1554) LTG: Patient will demonstrate problem solving for (SLP): Basic daily situations LTG Patient will demonstrate problem solving for: Minimal Assistance - Patient > 75%   Problem: RH Memory Goal: LTG Patient will use memory compensatory aids to (SLP) Description: LTG:  Patient will use memory compensatory aids to recall biographical/new, daily complex information with cues (SLP) Flowsheets (Taken 11/09/2023 1554) LTG: Patient will use memory compensatory aids to (SLP): Minimal Assistance - Patient > 75%   Problem: RH Awareness Goal: LTG: Patient will demonstrate awareness during functional activites type of (SLP) Description: LTG: Patient will demonstrate awareness during functional activites type of (SLP) Flowsheets (Taken 11/09/2023 1554) Patient will demonstrate during cognitive/linguistic activities awareness type of: Intellectual LTG: Patient will demonstrate awareness during cognitive/linguistic activities with assistance of (SLP): Minimal Assistance - Patient > 75%

## 2023-11-09 NOTE — Evaluation (Signed)
Occupational Therapy Assessment and Plan  Patient Details  Name: Brian [name] MRN: [id number] Date of Birth: 03-06-1961  OT Diagnosis: muscle weakness (generalized) and decreased cardiorespiratory endurance Rehab Potential: Rehab Potential (ACUTE ONLY): Good ELOS: 14-17 days   Today's Date: 11/09/2023 OT Individual Time: 0850-1000 OT Individual Time Calculation (min): 70 min     Hospital Problem: Principal Problem:   Acute metabolic encephalopathy   Past Medical History:  Past Medical History:  Diagnosis Date   Alcoholic pancreatitis    ETOH abuse    HTN (hypertension)    Hypertension    Morbid obesity (HCC)    Past Surgical History: No past surgical history on file.  Assessment & Plan Clinical Impression: Patient is a 62 y.o. year old male with recent admission to the hospital oncirrhosis, alcohol dependence (drinks two 1/5ths of liquor weekly) and severe bilateral lower extremity lymphedema who was admitted for AMS after he was found down at home. Initial CT head is negative. Spot EEG shows diffuse encephalopathy. PMHx: Alcoholic, pancreatitis, HTN, morbid obesity.  Patient transferred to CIR on 11/08/2023 .    Patient currently requires max with basic self-care skills secondary to muscle weakness, decreased cardiorespiratoy endurance, and decreased sitting balance, decreased standing balance, decreased postural control, and decreased balance strategies.  Prior to hospitalization, patient could complete BADL with min.  Patient will benefit from skilled intervention to increase independence with basic self-care skills prior to discharge home with care partner.  Anticipate patient will require 24 hour supervision and minimal physical assistance and follow up home health.  OT - End of Session Activity Tolerance: Tolerates < 10 min activity with changes in vital signs Endurance Deficit: Yes Endurance Deficit Description: rest breaks within BADL tasks OT Assessment Rehab  Potential (ACUTE ONLY): Good OT Patient demonstrates impairments in the following area(s): Balance;Behavior;Endurance;Motor;Sensory;Skin Integrity OT Basic ADL's Functional Problem(s): Grooming;Bathing;Dressing;Toileting OT Transfers Functional Problem(s): Toilet OT Additional Impairment(s): Fuctional Use of Upper Extremity OT Plan OT Intensity: Minimum of 1-2 x/day, 45 to 90 minutes OT Frequency: 5 out of 7 days OT Duration/Estimated Length of Stay: 14-17 days OT Treatment/Interventions: Balance/vestibular training;Cognitive remediation/compensation;Community reintegration;Discharge planning;Disease mangement/prevention;DME/adaptive equipment instruction;Functional electrical stimulation;Functional mobility training;Neuromuscular re-education;Pain management;Patient/family education;Psychosocial support;Self Care/advanced ADL retraining;Skin care/wound managment;Splinting/orthotics;Therapeutic Activities;Therapeutic Exercise;UE/LE Strength taining/ROM;UE/LE Coordination activities;Visual/perceptual remediation/compensation;Wheelchair propulsion/positioning OT Basic Self-Care Anticipated Outcome(s): Min A OT Toileting Anticipated Outcome(s): Min A OT Bathroom Transfers Anticipated Outcome(s): Supervision`` OT Recommendation Recommendations for Other Services: Neuropsych consult Patient destination: Home Follow Up Recommendations: Home health OT;24 hour supervision/assistance Equipment Recommended: To be determined   OT Evaluation Precautions/Restrictions  Precautions Precautions: Fall Precaution Comments: incontinence Restrictions Weight Bearing Restrictions: No Pain Pain Assessment Pain Scale: 0-10 Pain Score: 0-No pain Home Living/Prior Functioning Home Living Family/patient expects to be discharged to:: Private residence Living Arrangements: Spouse/significant other Available Help at Discharge: Family, Available 24 hours/day Type of Home: House Home Access: Stairs to  enter Secretary/administrator of Steps: 1 Entrance Stairs-Rails: None Home Layout: 1/2 bath on main level, Two level, Other (Comment) (sleeping on couch or on recliner) Alternate Level Stairs-Number of Steps: flight Alternate Level Stairs-Rails: Can reach both Bathroom Shower/Tub: Sponge bathes at baseline [employer]: Standard Bathroom Accessibility: Yes  Lives With: Spouse, Son IADL History Homemaking Responsibilities: No Education: 11th grade IADL Comments: Likes to watch football, Cowboys fan. Sedentary at baseline Prior Function Level of Independence: Needs assistance with ADLs, Needs assistance with homemaking, Requires assistive device for independence Vocation: Unemployed (seeking disability for injury from ~1 year ago) Vision Baseline Vision/History: 0  No visual deficits Ability to See in Adequate Light: 0 Adequate Vision Assessment?: No apparent visual deficits Perception  Perception: Not tested Praxis Praxis: Not tested Cognition Cognition Overall Cognitive Status: Impaired/Different from baseline Arousal/Alertness: Awake/alert Orientation Level: Person;Place;Situation Person: Oriented Place: Oriented Situation: Oriented Memory: Impaired Memory Impairment: Decreased short term memory;Storage deficit;Retrieval deficit Decreased Short Term Memory: Verbal basic;Functional basic Attention: Focused;Sustained Focused Attention: Appears intact Sustained Attention: Appears intact Selective Attention: Impaired Selective Attention Impairment: Functional basic Awareness: Impaired Awareness Impairment: Intellectual impairment;Emergent impairment;Anticipatory impairment Problem Solving: Impaired Problem Solving Impairment: Functional basic;Verbal basic Executive Function: Initiating;Self Monitoring Initiating: Impaired Initiating Impairment: Functional basic Self Monitoring: Impaired Self Monitoring Impairment: Functional basic Safety/Judgment: Impaired Brief  Interview for Mental Status (BIMS) Repetition of Three Words (First Attempt): 3 Temporal Orientation: Year: Correct Temporal Orientation: Month: Accurate within 5 days Temporal Orientation: Day: Correct Recall: "Sock": Yes, no cue required Recall: "Blue": Yes, no cue required Recall: "Bed": Yes, no cue required BIMS Summary Score: 15 Sensation Sensation Light Touch: Impaired by gross assessment Coordination Gross Motor Movements are Fluid and Coordinated: No Fine Motor Movements are Fluid and Coordinated: No Coordination and Movement Description: some decreased smoothness at accuracy Motor  Motor Motor - Skilled Clinical Observations: generalized weakness  Trunk/Postural Assessment     Balance Static Sitting Balance Static Sitting - Balance Support: Feet supported Static Sitting - Level of Assistance: 5: Stand by assistance Dynamic Sitting Balance Dynamic Sitting - Balance Support: Feet supported Dynamic Sitting - Level of Assistance: 5: Stand by assistance Extremity/Trunk Assessment RUE Assessment RUE Assessment: Exceptions to [facility] General Strength Comments: 3+/5 LUE Assessment LUE Assessment: Exceptions to [facility] General Strength Comments: grossly 3+/5  Care Tool Care Tool Self Care Eating   Eating Assist Level: Set up assist    Oral Care    Oral Care Assist Level: Supervision/Verbal cueing    Bathing   Body parts bathed by patient: Right arm;Left arm;Chest;Abdomen;Face Body parts bathed by helper: Front perineal area;Buttocks;Right upper leg;Left upper leg;Right lower leg;Left lower leg   Assist Level: Maximal Assistance - Patient 24 - 49%    Upper Body Dressing(including orthotics)   What is the patient wearing?: Pull over shirt   Assist Level: Minimal Assistance - Patient > 75%    Lower Body Dressing (excluding footwear)   What is the patient wearing?: Pants;Incontinence brief Assist for lower body dressing: Total Assistance - Patient < 25%    Putting  on/Taking off footwear   What is the patient wearing?: Non-skid slipper socks Assist for footwear: Dependent - Patient 0%       Care Tool Toileting Toileting activity   Assist for toileting: Dependent - Patient 0%     Care Tool Bed Mobility Roll left and right activity   Roll left and right assist level: Minimal Assistance - Patient > 75%    Sit to lying activity   Sit to lying assist level: Minimal Assistance - Patient > 75%    Lying to sitting on side of bed activity   Lying to sitting on side of bed assist level: the ability to move from lying on the back to sitting on the side of the bed with no back support.: Minimal Assistance - Patient > 75%     Care Tool Transfers Sit to stand transfer Sit to stand activity did not occur: Safety/medical concerns (unable to stand with 1 person)      Chair/bed transfer   Chair/bed transfer assist level: Dependent - mechanical lift     Toilet  transfer   Assist Level: Dependent - Patient 0% (Mechanical lift, STedy)     Care Tool Cognition  Expression of Ideas and Wants Expression of Ideas and Wants: 4. Without difficulty (complex and basic) - expresses complex messages without difficulty and with speech that is clear and easy to understand  Understanding Verbal and Non-Verbal Content Understanding Verbal and Non-Verbal Content: 4. Understands (complex and basic) - clear comprehension without cues or repetitions   Memory/Recall Ability Memory/Recall Ability : Current season;That he or she is in a hospital/hospital unit   Refer to Care Plan for Long Term Goals  SHORT TERM GOAL WEEK 1 OT Short Term Goal 1 (Week 1): Patient will complete toilet transfer with max A of 1 OT Short Term Goal 2 (Week 1): Pt will complete 1 step of toileting OT Short Term Goal 3 (Week 1): Patient will stand with max A of 1  Recommendations for other services: Neuropsych   Skilled Therapeutic Intervention OT eval completed addressing rehab process, OT  purpose, POC, ELOS, and goals.  Bed level LB bathing/dressing completed with max/total A overall. Pt sat EOB for UB bathing/dressing with min A. Unable to achieve stand without device and 1 person assist. Stedy used for sit<>stand with mod A of 1. See below for further details regarding BADL performance. Pt left semi-reclined in bed with bed alarm on and needs met.  ADL ADL Eating: Set up Grooming: Setup Upper Body Bathing: Setup Lower Body Bathing: Maximal assistance Upper Body Dressing: Minimal assistance Lower Body Dressing: Maximal assistance Toileting: Dependent Mobility  Bed Mobility Bed Mobility: Rolling Right;Rolling Left;Sit to Supine;Supine to Sit Rolling Right: Minimal Assistance - Patient > 75% Rolling Left: Minimal Assistance - Patient > 75% Supine to Sit: Minimal Assistance - Patient > 75% Sit to Supine: Moderate Assistance - Patient 50-74%   Discharge Criteria: Patient will be discharged from OT if patient refuses treatment 3 consecutive times without medical reason, if treatment goals not met, if there is a change in medical status, if patient makes no progress towards goals or if patient is discharged from hospital.  The above assessment, treatment plan, treatment alternatives and goals were discussed and mutually agreed upon: by patient  Mal Amabile 11/09/2023, 12:55 PM

## 2023-11-10 LAB — CBC WITH DIFFERENTIAL/PLATELET
Abs Immature Granulocytes: 0.03 10*3/uL (ref 0.00–0.07)
Basophils Absolute: 0 10*3/uL (ref 0.0–0.1)
Basophils Relative: 1 %
Eosinophils Absolute: 0.3 10*3/uL (ref 0.0–0.5)
Eosinophils Relative: 6 %
HCT: 33 % — ABNORMAL LOW (ref 39.0–52.0)
Hemoglobin: 11.4 g/dL — ABNORMAL LOW (ref 13.0–17.0)
Immature Granulocytes: 1 %
Lymphocytes Relative: 21 %
Lymphs Abs: 0.9 10*3/uL (ref 0.7–4.0)
MCH: 34.1 pg — ABNORMAL HIGH (ref 26.0–34.0)
MCHC: 34.5 g/dL (ref 30.0–36.0)
MCV: 98.8 fL (ref 80.0–100.0)
Monocytes Absolute: 0.4 10*3/uL (ref 0.1–1.0)
Monocytes Relative: 10 %
Neutro Abs: 2.7 10*3/uL (ref 1.7–7.7)
Neutrophils Relative %: 61 %
Platelets: 145 10*3/uL — ABNORMAL LOW (ref 150–400)
RBC: 3.34 MIL/uL — ABNORMAL LOW (ref 4.22–5.81)
RDW: 14.6 % (ref 11.5–15.5)
WBC: 4.4 10*3/uL (ref 4.0–10.5)
nRBC: 0 % (ref 0.0–0.2)

## 2023-11-10 LAB — BASIC METABOLIC PANEL
Anion gap: 5 (ref 5–15)
BUN: 6 mg/dL (ref 6–20)
CO2: 20 mmol/L — ABNORMAL LOW (ref 22–32)
Calcium: 8.4 mg/dL — ABNORMAL LOW (ref 8.9–10.3)
Chloride: 109 mmol/L (ref 98–111)
Creatinine, Ser: 0.51 mg/dL — ABNORMAL LOW (ref 0.61–1.24)
GFR, Estimated: >60 mL/min (ref >60)
Glucose, Bld: 98 mg/dL (ref 70–99)
Potassium: 4.1 mmol/L (ref 3.5–5.1)
Sodium: 134 mmol/L — ABNORMAL LOW (ref 135–145)

## 2023-11-10 MED ORDER — HYDROCERIN EX CREA
TOPICAL_CREAM | Freq: Every day | CUTANEOUS | Status: DC
  Filled 2023-11-10: qty 113

## 2023-11-10 MED ORDER — LACTULOSE 10 GM/15ML PO SOLN
10.0000 g | Freq: Two times a day (BID) | ORAL | Status: DC | PRN
Start: 2023-11-10 — End: 2023-11-28
  Administered 2023-11-24: 10 g via ORAL
  Filled 2023-11-10: qty 15

## 2023-11-10 NOTE — Progress Notes (Signed)
Physical Therapy Session Note  Patient Details  Name: Brian [name] MRN: [id number] Date of Birth: 1961-06-02  Today's Date: 11/10/2023 PT Individual Time: 805-900 +  1350-1419 PT Individual Time Calculation (min): 55 min + 29 min   Short Term Goals: Week 1:  PT Short Term Goal 1 (Week 1): Pt will complete sit<>stand with mod assist to LRAD PT Short Term Goal 2 (Week 1): Pt will complete stand pivot transfer with LRAD and mod assist PT Short Term Goal 3 (Week 1): Pt will ambulate with LRAD 10' with mod assist and +2 for WC follow  Skilled Therapeutic Interventions/Progress Updates:     SESSION 1: Pt presents in room in bed, motivated to participate with PT. Pt denies pain at this time. Session focused on therapeutic activities for participation with self care tasks and transfer training for sit<>stand and upright tolerance as well as gait training with //bars.  Pt completes bed mobility with supervision and hospital bed features. Pt doffs hospital gown and dons scrub shirt with min assist, dons pants with max assist. Pt completes stand to stedy with min assist. Pt maintains standing x4 min with BUE support and hip/knee support on stedy for therapist to assist with changing brief and manage periarea hygiene total assist. Pt comes to sitting with cues for eccentric lowering and positioning with min assist.  Pt completes x2 sit<>stand in //bars, pull to stand, completes x10 marches bilaterally, RLE on first stand, LLE on second stand. Pt ambulates in //bars with min assist and 2nd person WC follow 6' demonstrating mild knee instability bilaterally but no buckling noted.  Pt completes sit<>stand x5 to RW with min/mod assist with fatigue, cues for eccentric lowering and hand placement, completed to promote improved technique and muscle fiber recruitment needed for functional transfers.  Pt returns to room and remains seated in [facility] with all needs within reach, cal light in place and chair alarm  donned and activated at end of session.   SESSION 2: Pt presents in room seated in WC, agreeable to PT. Pt denies pain. Session focused on transfer training for sit<>stand and upright tolerance as well as gait training with RW.  Pt transported via WC to main gym for time management and energy conservation. Pt completes sit<>stand to RW with min assist, cues for hand placement. Pt completes x20 alternating BLE marches with BUE support on RW min assist for postural stability, mild knee instability with fatigue no knee buckling noted.  Pt ambulates with RW ~8' with min assist and close WC follow by 2nd person, demonstrates knee instability with fatigue and requesting to sit. Pt noted to have incontinent BM, transported back to room dependently.  Pt completes sit<>stand to RW mod assist and maintains standing 1 min with CGA for balance therapist and 2nd person to manage doffing pants, brief and begin periarea hygiene before pt demonstrates BLE fatigue and requests to return to sit. Pt able to stand to RW mod assist x2, maintains standing balance with min assist ~30 seconds for periarea hygiene then requests to sit. Pt completes final stand to stedy, pull to stand with mod assist and maintains standing x2 min for managing donning new pants and brief. Above completed to promote tolerance to upright position and static standing balance. Pt returns to sitting in WC and remains seated in [facility] with all needs within reach, cal light in place and chair alarm donned and activated at end of session.   Therapy Documentation Precautions:  Precautions Precautions: Fall Precaution Comments:  incontinence Restrictions Weight Bearing Restrictions: No    Therapy/Group: Individual Therapy  Edwin Cap PT, DPT 11/10/2023, 3:58 PM

## 2023-11-10 NOTE — Progress Notes (Signed)
Speech Language Pathology Daily Session Note  Patient Details  Name: Brian [name] MRN: [id number] Date of Birth: [birth year]  Today's Date: 11/10/2023 SLP Individual Time: 1000-1100 SLP Individual Time Calculation (min): 60 min  Short Term Goals: Week 1: SLP Short Term Goal 1 (Week 1): Patient will orient to date, time, situation, and person given external aids and min verbal cues. SLP Short Term Goal 2 (Week 1): Patient will utilize external and internal memory aids to recall daily information with mod verbal cues. SLP Short Term Goal 3 (Week 1): Patient will state two cognitive and two physical impairments demonstrating intellectual awareness given mod assist. SLP Short Term Goal 4 (Week 1): Patient will solve basic environmental problems in 4/5 opportunities given mod assist.  Skilled Therapeutic Interventions: SLP conducted skilled therapy session targeting cognitive retraining goals. Patient exhibiting much improved cognition compared to previous day. Patient oriented to date with min assist, though required reorientation halfway through the session. Patient utilized external memory aids to recall therapy schedule with min-mod verbal cues and solved basic environmental problems in 5/5 opportunities with provided with min-mod assist. Patient was left in chair with call bell in reach and chair alarm set. SLP will continue to target goals per plan of care.       Pain Pain Assessment Pain Scale: 0-10 Pain Score: 0-No pain  Therapy/Group: Individual Therapy  Jeannie Done, M.A., CCC-SLP  [name] 11/10/2023, 12:55 PM

## 2023-11-10 NOTE — Progress Notes (Signed)
PROGRESS NOTE   Subjective/Complaints:  Ammonia yesterday WNL.  Patient seen up at bedside today, more awake and alert, answering questions more briskly than prior exams.   Endorses ongoing frequent stools and bowel incontinence, but thinks that his urinary continence is improving.  Per record, multiple large liquid bowel movements overnight and urinations, all incontinent.  Patient denies any education on management of his lower extremity lymphedema.  Does not wrap or wear TED hose at home.  Was not aware that any management strategies existed.  States his left knee is feeling much better, is able to get through therapies with no pain.  Labs today stable/improved.    ROS: + bowel and bladder incontinence -improving,left knee pain-stable/resolved, bowel/bladder incontinence-ongoing. denies fevers, chills, N/V, abdominal pain, constipation, diarrhea, SOB, cough, chest pain, new weakness or paraesthesias.    Objective:   DG Knee 1-2 Views Left  Result Date: 11/08/2023 CLINICAL DATA:  Bilateral knee pain. Chronic lower extremity lymphedema. EXAM: LEFT KNEE - 1-2 VIEW COMPARISON:  None Available. FINDINGS: Moderate to severe medial compartment joint space narrowing and peripheral osteophytosis. Mild peripheral lateral compartment degenerative osteophytosis without joint space narrowing. Moderate to severe patellofemoral joint space narrowing with mild-to-moderate peripheral osteophytes. Mild chronic enthesopathic change at the quadriceps and patellar tendon insertions on the patella. No significant joint effusion. No acute fracture or dislocation. Moderate atherosclerotic calcifications. 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 Electronically Signed   By: Neita Garnet M.D.   On: 11/08/2023 14:00   Recent Labs    11/09/23 0518 11/10/23 0702  WBC 4.2 4.4  HGB 10.7* 11.4*  HCT 30.4* 33.0*  PLT  150 145*   Recent Labs    11/09/23 0518 11/10/23 0702  NA 133* 134*  K 4.0 4.1  CL 108 109  CO2 21* 20*  GLUCOSE 96 98  BUN 7 6  CREATININE 0.53* 0.51*  CALCIUM 8.5* 8.4*    Intake/Output Summary (Last 24 hours) at 11/10/2023 1610 Last data filed at 11/10/2023 0854 Gross per 24 hour  Intake 834 ml  Output --  Net 834 ml     Pressure Injury 11/08/23 Heel Right Deep Tissue Pressure Injury - Purple or maroon localized area of discolored intact skin or blood-filled blister due to damage of underlying soft tissue from pressure and/or shear. (Active)  11/08/23 1600  Location: Heel  Location Orientation: Right  Staging: Deep Tissue Pressure Injury - Purple or maroon localized area of discolored intact skin or blood-filled blister due to damage of underlying soft tissue from pressure and/or shear.  Wound Description (Comments):   Present on Admission: Yes    Physical Exam: Vital Signs Blood pressure (!) 122/53, pulse 80, temperature 98.6 F (37 C), temperature source Oral, resp. rate 16, height 5\' 11"  (1.803 m), weight 121 kg, SpO2 91%. Constitutional: No apparent distress. Appropriate appearance for age.  Morbidly obese.  HENT: No JVD. Neck Supple. Trachea midline. Atraumatic, normocephalic. + Goiter Eyes: PERRLA. EOMI. Visual fields grossly intact.  Cardiovascular: RRR, no murmurs/rub/gallops. 2+ bilateral nonpitting lymphedema Respiratory: CTAB. No rales, rhonchi, or wheezing. On RA.  Abdomen: + bowel sounds, normoactive. Mild distention, no  tenderness.  Skin:  +  Bilateral chronic lower extremity skin thickening and discolorations; no open areas.  Flaking, dry skin over bilateral shins. + Bilateral buttocks and coccygeal MASD + Right heel deep tissue injury, unstageable, with underlying bruise            MSK:      No apparent deformity.      Strength: Antigravity and against resistance all 4 extremities, 4 out of 5, unchanged  Neurologic exam:  Cognition:  [name] to person, place, time + Cognitive slowing, poor insight, memory deficit -does appear somewhat improved today both in speed and content Language: Fluent, No substitutions or neoglisms. No dysarthria. Insight: Poor insight into current condition.  Mood: Pleasant affect, appropriate mood.  Sensation: Equal and intact in BL UE and Les.  Coordination: No apparent tremors.  Mild bilateral upper extremity ataxia on finger-to-nose.   Assessment/Plan: 1. Functional deficits which require 3+ hours per day of interdisciplinary therapy in a comprehensive inpatient rehab setting. Physiatrist is providing close team supervision and 24 hour management of active medical problems listed below. Physiatrist and rehab team continue to assess barriers to discharge/monitor patient progress toward functional and medical goals  Care Tool:  Bathing    Body parts bathed by patient: Right arm, Left arm, Chest, Abdomen, Face   Body parts bathed by helper: Front perineal area, Buttocks, Right upper leg, Left upper leg, Right lower leg, Left lower leg     Bathing assist Assist Level: Maximal Assistance - Patient 24 - 49%     Upper Body Dressing/Undressing Upper body dressing   What is the patient wearing?: Pull over shirt    Upper body assist Assist Level: Minimal Assistance - Patient > 75%    Lower Body Dressing/Undressing Lower body dressing      What is the patient wearing?: Pants, Incontinence brief     Lower body assist Assist for lower body dressing: Total Assistance - Patient < 25%     Toileting Toileting    Toileting assist Assist for toileting: Dependent - Patient 0%     Transfers Chair/bed transfer  Transfers assist     Chair/bed transfer assist level: Dependent - mechanical lift     Locomotion Ambulation   Ambulation assist   Ambulation activity did not occur: Safety/medical concerns          Walk 10 feet activity   Assist  Walk 10 feet activity did not  occur: Safety/medical concerns        Walk 50 feet activity   Assist Walk 50 feet with 2 turns activity did not occur: Safety/medical concerns         Walk 150 feet activity   Assist Walk 150 feet activity did not occur: Safety/medical concerns         Walk 10 feet on uneven surface  activity   Assist Walk 10 feet on uneven surfaces activity did not occur: Safety/medical concerns         Wheelchair     Assist Is the patient using a wheelchair?: Yes Type of Wheelchair: Manual    Wheelchair assist level: Dependent - Patient 0% Max wheelchair distance: 150    Wheelchair 50 feet with 2 turns activity    Assist        Assist Level: Dependent - Patient 0%   Wheelchair 150 feet activity     Assist      Assist Level: Dependent - Patient 0%   Blood pressure (!) 122/53, pulse 80, temperature 98.6 F (37 C), temperature source Oral,  resp. rate 16, height 5\' 11"  (1.803 m), weight 121 kg, SpO2 91%.  Medical Problem List and Plan: 1. Functional deficits secondary to acute encephalopathy, hepatic and possible metabolic component             -patient may shower             -ELOS/Goals: 10-14 days S             - Stable to continue CIR  2.  Antithrombotics: -DVT/anticoagulation:  Pharmaceutical: Lovenox             -antiplatelet therapy: N/A 3.Left knee pain: XR ordered, voltaren gel ordered   - 11/21: No complaints today. 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  May benefit from hinged knee brace when weightbearing, or injection if medically stable.  Monitor today for therapy assessments.  - 11/22: With PT, denied pain during session yesterday with ambulation and transfers. No adjustments at this time.   4. Mood/Behavior/Sleep: Provide emotional support             -antipsychotic agents: N/A 5. Neuropsych/cognition: This patient is NOT capable of making decisions on his own behalf. 6. Skin/Wound Care:  Routine skin checks..Excoriation of buttocks with wound care as directed -Low-air-loss mattress, Mepilex dressing to bilateral heels to prevent breakdown--add Sage boot RLE 11/22. -Goodhart's Butt cream to prevent worsening MASD -Hydrocyn cream to bilateral legs for flaking, dry skin   7. Fluids/Electrolytes/Nutrition: Routine in and outs with follow-up chemistries  -11-21: Sodium 133, repeat in a.m. for trend.  Otherwise labs stable.  11-22: Improved 134 today; next labs on Monday, monitor for cognitive changes  8.  Colitis versus ileitis-acute appendicitis ruled out.  Follow-up outpatient general surgery 9.  Hypertension.  Blood pressure remains a bit soft remains on ProAmatine 10 mg 3 times daily as well as low-dose Coreg 12.5 mg twice daily.   - 11/21: Diastolic hypotension as below; resume midodrine 5 mg 2 times daily and wean as tolerated     11/10/2023    3:11 AM 11/10/2023    3:07 AM 11/09/2023    7:59 PM  Vitals with BMI  Weight 266 lbs 12 oz    BMI 37.22    Systolic  122 132  Diastolic  53 68  Pulse  80 79    10.  Chronic lymphedema.  Follow-up outpatient.  -PT order for lymphedema wrapping and assistance and education, management 11-22  11.History of alcohol use.  Counseling 12.  Tobacco use.  Counseling 13.  Obesity.  BMI 37.08.  Dietary follow-up 14.  Prediabetes.  Hemoglobin A1c 5.8. d/c ISS and start metformin ER release with dinner, creatinine reviewed and is stable -Blood sugars well-controlled, DC BG checks Recent Labs    11/08/23 2350 11/09/23 0352 11/09/23 1207  GLUCAP 112* 99 98     15.  BPH.  Flomax 0.4 mg daily. BP reviewed and is stable   16. Hyperammonemia.  Hepatic steatosis seen on intake CT.  - 11/21: Seems more encephalopathic today, last ammonia was increased from 40s to 60s, add on to a.m. labs and resume lactulose 7.5 mg twice daily if worsening  11-22: Ammonia within normal limits.  Continues having 4-5 stools per day.  Change lactulose  to 10 mg twice daily as needed to target at least 3 stools per day.  Cognition does seem to be improving.  17. Anemia.  hemoglobin decreased on intake from 12,6 to 10.7.  FOBT, H&H in AM. -11-22: Hemoglobin improved  to 11.4, FOBT pending but no obvious blood in stool.  LOS: 2 days A FACE TO FACE EVALUATION WAS PERFORMED  Angelina Sheriff 11/10/2023, 9:27 AM

## 2023-11-10 NOTE — Progress Notes (Signed)
Occupational Therapy Session Note  Patient Details  Name: Brian [name] MRN: [id number] Date of Birth: [birth year]  Today's Date: 11/10/2023 OT Individual Time: 7829-5621 OT Individual Time Calculation (min): 55 min    Short Term Goals: Week 1:  OT Short Term Goal 1 (Week 1): Patient will complete toilet transfer with max A of 1 OT Short Term Goal 2 (Week 1): Pt will complete 1 step of toileting OT Short Term Goal 3 (Week 1): Patient will stand with max A of 1  Skilled Therapeutic Interventions/Progress Updates:    Pt greeted seated in wc with son and grandson present, but about to leave. Pt agreeable to OT treatment session. Pt brought to therapy gym and completed stand-pivot w/ RW with mod A but much improved power up. UB there-ex using 2 lb weighted bar 4 sets of 10 chest press, bicep curl, and straight arm raise. Addressed LB strength with 3 sets of 5 sit<>stands with RW with min to mod A pending fatigue level. Standing balance/endurance with standing reaching activity. Pt pivoted back to wc with mod A to stand and min A to pivot w/ RW. Pt returned to room and was set-up with lunch tray. Chair alarm on, call bell in reach, and needs met.   Therapy Documentation Precautions:  Precautions Precautions: Fall Precaution Comments: incontinence Restrictions Weight Bearing Restrictions: No Pain:  Denies pain  Therapy/Group: Individual Therapy  Mal Amabile 11/10/2023, 11:49 AM

## 2023-11-10 NOTE — Progress Notes (Signed)
Patient ID: Brian [name], male   DOB: Nov 01, 1961, 62 y.o.   MRN: [id number]  1039- SW left message for pt wife informing on ELOS, and SW will follow-up with updates after team conference.   SW placed request with Financial Navigator request for Medicaid and disability screening. Pt will have follow-up.   Cecile Sheerer, MSW, LCSW Office: [phone] Cell: [phone] Fax: [phone]

## 2023-11-10 NOTE — Progress Notes (Signed)
Inpatient Rehabilitation Care Coordinator Assessment and Plan Patient Details  Name: Brian [name] MRN: [id number] Date of Birth: 02-Sep-1961  Today's Date: 11/10/2023  Hospital Problems: Principal Problem:   Acute metabolic encephalopathy  Past Medical History:  Past Medical History:  Diagnosis Date   Alcoholic pancreatitis    ETOH abuse    HTN (hypertension)    Hypertension    Morbid obesity (HCC)    Past Surgical History: No past surgical history on file. Social History:  reports that he has been smoking cigarettes. He does not have any smokeless tobacco history on file. No history on file for alcohol use and drug use.  Family / Support Systems Marital Status: Married How Long?: 38 years Patient Roles: Spouse, Parent Spouse/Significant Other: Brian [name] (wife) Children: two childrne- Brian [name] (son; lives in the home); Brian [name] (dtr; lives in [place]) Other Supports: PRN support from children Anticipated Caregiver: wife Ability/Limitations of Caregiver: Pt reports his wife is on disability. No limitations reported with providing care at this time. Caregiver Availability: 24/7 Family Dynamics: Pt lives with his wife and their adult son. THeir son works 12hr shifts.  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 Education: 11th grade Health Literacy - How often do you need to have someone help you when you read instructions, pamphlets, or other written material from your doctor or pharmacy?: Never Writes: Yes Employment Status: Disabled Date Retired/Disabled/Unemployed: Disabled over one year ago Marine scientist Issues: Denies Guardian/Conservator: Denies   Abuse/Neglect Abuse/Neglect Assessment Can Be Completed: Yes Physical Abuse: Denies Verbal Abuse: Denies Sexual Abuse: Denies Exploitation of patient/patient's  resources: Denies Self-Neglect: Denies  Patient response to: Social Isolation - How often do you feel lonely or isolated from those around you?: Never  Emotional Status Pt's affect, behavior and adjustment status: Pt in good spirits at time of visit Recent Psychosocial Issues: Depression due to loss of family members (mother 4 yrs ago; twin sister 5 yrs ago). Reports the reason he has increased drinking. Psychiatric History: See above Substance Abuse History: Pt admits to drinking about a shot of whiskey twice a day. Denies any tobacco product or rec drug use.  Patient / Family Perceptions, Expectations & Goals Pt/Family understanding of illness & functional limitations: Pt and family have a general understanding of care needs Premorbid pt/family roles/activities: Independen Anticipated changes in roles/activities/participation: Assistance with ADLs/IADLs Pt/family expectations/goals: pt goal is to work on myself, not drinking, walking and build strength.  Community Resources [employer]: None Premorbid Home Care/DME Agencies: None Transportation available at discharge: TBD Is the patient able to respond to transportation needs?: Yes In the past 12 months, has lack of transportation kept you from medical appointments or from getting medications?: No In the past 12 months, has lack of transportation kept you from meetings, work, or from getting things needed for daily living?: No Resource referrals recommended: Neuropsychology  Discharge Planning Living Arrangements: Spouse/significant other, Children Support Systems: Spouse/significant other, Children Type of Residence: Private residence Insurance Resources: Customer service manager Resources: Family Support Financial Screen Referred: No Living Expenses: Own Money Management: Spouse Does the patient have any problems obtaining your medications?: No Home Management: Pt wife manages all home care needs Patient/Family Preliminary  Plans: TBD Care Coordinator Barriers to Discharge: Decreased caregiver support, Lack of/limited family support, Insurance for SNF coverage Care Coordinator Anticipated Follow Up Needs: HH/OP  Clinical Impression SW met with pt to introduce self,  explain role, and discuss discharge process. Pt is not a Cytogeneticist. No HCPOA. DME: Ricki Rodriguez 11/10/2023, 3:41 PM

## 2023-11-10 NOTE — Care Management (Signed)
Inpatient Rehabilitation Center Individual Statement of Services  Patient Name:  NIAL DERRINGTON  Date:  11/10/2023  Welcome to the Inpatient Rehabilitation Center.  Our goal is to provide you with an individualized program based on your diagnosis and situation, designed to meet your specific needs.  With this comprehensive rehabilitation program, you will be expected to participate in at least 3 hours of rehabilitation therapies Monday-Friday, with modified therapy programming on the weekends.  Your rehabilitation program will include the following services:  Physical Therapy (PT), Occupational Therapy (OT), Speech Therapy (ST), 24 hour per day rehabilitation nursing, Therapeutic Recreaction (TR), Psychology, Neuropsychology, Care Coordinator, Rehabilitation Medicine, Nutrition Services, Pharmacy Services, and Other  Weekly team conferences will be held on Tuesdays to discuss your progress.  Your Inpatient Rehabilitation Care Coordinator will talk with you frequently to get your input and to update you on team discussions.  Team conferences with you and your family in attendance may also be held.  Expected length of stay: 14-21 days    Overall anticipated outcome: Contact Guard to Supervision  Depending on your progress and recovery, your program may change. Your Inpatient Rehabilitation Care Coordinator will coordinate services and will keep you informed of any changes. Your Inpatient Rehabilitation Care Coordinator's name and contact numbers are listed  below.  The following services may also be recommended but are not provided by the Inpatient Rehabilitation Center:  Driving Evaluations Home Health Rehabiltiation Services Outpatient Rehabilitation Services Vocational Rehabilitation   Arrangements will be made to provide these services after discharge if needed.  Arrangements include referral to agencies that provide these services.  Your insurance has been verified to be:   Uninsured  Your primary doctor is:  No PCP  Pertinent information will be shared with your doctor and your insurance company.  Inpatient Rehabilitation Care Coordinator:  Susie Cassette [phone] or [phone]  Information discussed with and copy given to patient by: Gretchen Short, 11/10/2023, 10:19 AM

## 2023-11-10 NOTE — Plan of Care (Signed)
  Problem: Consults Goal: RH GENERAL PATIENT EDUCATION Description: See Patient Education module for education specifics. Outcome: Progressing Goal: Skin Care Protocol Initiated - if Braden Score 18 or less Description: If consults are not indicated, leave blank or document N/A Outcome: Progressing   Problem: RH BOWEL ELIMINATION Goal: RH STG MANAGE BOWEL WITH ASSISTANCE Description: STG Manage Bowel with min Assistance. Outcome: Progressing Goal: RH STG MANAGE BOWEL W/MEDICATION W/ASSISTANCE Description: STG Manage Bowel with Medication with min Assistance. Outcome: Progressing   Problem: RH BLADDER ELIMINATION Goal: RH STG MANAGE BLADDER WITH ASSISTANCE Description: STG Manage Bladder With min Assistance Outcome: Progressing   Problem: RH SKIN INTEGRITY Goal: RH STG SKIN FREE OF INFECTION/BREAKDOWN Description: MASD will improve and skin will be free of infection without additional breakdown with min assist  Outcome: Progressing Goal: RH STG MAINTAIN SKIN INTEGRITY WITH ASSISTANCE Description: STG Maintain Skin Integrity With min Assistance. Outcome: Progressing Goal: RH STG ABLE TO PERFORM INCISION/WOUND CARE W/ASSISTANCE Description: STG Able To Perform Incision/Wound Care With min Assistance. Outcome: Progressing   Problem: RH SAFETY Goal: RH STG ADHERE TO SAFETY PRECAUTIONS W/ASSISTANCE/DEVICE Description: STG Adhere to Safety Precautions With min Assistance/Device. Outcome: Progressing Goal: RH STG DECREASED RISK OF FALL WITH ASSISTANCE Description: STG Decreased Risk of Fall With cueing Assistance. Outcome: Progressing   Problem: RH PAIN MANAGEMENT Goal: RH STG PAIN MANAGED AT OR BELOW PT'S PAIN GOAL Description: Less than 2 with PRN medications min assist  Outcome: Progressing   Problem: RH KNOWLEDGE DEFICIT GENERAL Goal: RH STG INCREASE KNOWLEDGE OF SELF CARE AFTER HOSPITALIZATION Description: Patient/caregiver will be able to manage medications, self care,  diet/lifestyle modifications to improve overall health from nursing education, nursing handouts and other resources independently  Outcome: Progressing   Problem: Consults Goal: RH GENERAL PATIENT EDUCATION Description: See Patient Education module for education specifics. Outcome: Progressing   Problem: Education: Goal: Ability to describe self-care measures that may prevent or decrease complications (Diabetes Survival Skills Education) will improve Outcome: Progressing Goal: Individualized Educational Video(s) Outcome: Progressing   Problem: Coping: Goal: Ability to adjust to condition or change in health will improve Outcome: Progressing

## 2023-11-11 MED ORDER — HYDROCERIN EX CREA
TOPICAL_CREAM | Freq: Two times a day (BID) | CUTANEOUS | Status: DC
  Filled 2023-11-11 (×2): qty 113

## 2023-11-11 MED ORDER — SPIRONOLACTONE 12.5 MG HALF TABLET: 12.5000 mg | ORAL_TABLET | Freq: Every day | ORAL | Status: DC

## 2023-11-11 NOTE — Progress Notes (Signed)
Lower extremities washed, dried, Eucerin applied followed by ace wrap application per MD order.    Tilden Dome, LPN

## 2023-11-11 NOTE — Progress Notes (Signed)
Speech Language Pathology Daily Session Note  Patient Details  Name: Brian [name] MRN: [id number] Date of Birth: May 31, 1961  Today's Date: 11/11/2023 SLP Individual Time: 0800-0900 SLP Individual Time Calculation (min): 60 min  Short Term Goals: Week 1: SLP Short Term Goal 1 (Week 1): Patient will orient to date, time, situation, and person given external aids and min verbal cues. SLP Short Term Goal 2 (Week 1): Patient will utilize external and internal memory aids to recall daily information with mod verbal cues. SLP Short Term Goal 3 (Week 1): Patient will state two cognitive and two physical impairments demonstrating intellectual awareness given mod assist. SLP Short Term Goal 4 (Week 1): Patient will solve basic environmental problems in 4/5 opportunities given mod assist.  Skilled Therapeutic Interventions:  Patient was seen for skilled ST services targeting cognitive-communication.  Upon SLP entry patient was asleep; however, was agreeable to completing treatment.  Patient cannot exactly recall why he was in the hospital, just stating "drinking".  He also reports no significant "thinking changes".  SLP today's session by reviewing WRAP strategies in more depth, providing written examples, and discussing a memory journal.  Patient was unable to recall WRAP strategies and what they are supposed to be used for.  SLP also noted difficulty with reading.  Patient benefited from SLP spacing out written information, as well as increased lighting and larger handwriting/font.  SLP reviewed WRAP strategies in depth, breaking each strategy into it's own page in his new journal. SLP and pt developed relevant examples of each strategy. At the end of session, pt was not able to identify the mnemonic WRAP, but if prompted by being given the letter (W is for.Marland Kitchen...), pt was able to recall 3/4 strategies. Pt demonstrating significant difficulty retaining auditory and written information. SLP recommended pt  focus on "Write it" in his new journal, as external strategies will likely be easier than the internal strategies at this time.   Pt was left in room, all immediate needs within reach, and all safety measures activated. Cont with current POC.   Pain Pain Assessment Pain Score: 0-No pain  Therapy/Group: Individual Therapy  Ann Lions 11/11/2023, 12:25 PM

## 2023-11-11 NOTE — Progress Notes (Signed)
Physical Therapy Session Note  Patient Details  Name: Brian [name] MRN: [id number] Date of Birth: 1961-02-22  Today's Date: 11/11/2023 PT Individual Time: 1135-1205; 1343 - 1428 PT Individual Time Calculation (min): 30 min; 45 min   Short Term Goals: Week 1:  PT Short Term Goal 1 (Week 1): Pt will complete sit<>stand with mod assist to LRAD PT Short Term Goal 2 (Week 1): Pt will complete stand pivot transfer with LRAD and mod assist PT Short Term Goal 3 (Week 1): Pt will ambulate with LRAD 10' with mod assist and +2 for WC follow  SESSION 1 Skilled Therapeutic Interventions/Progress Updates: Patient sitting in WC on entrance to room. Patient alert and agreeable to PT session.   Patient reported no pain at beginning of PT session. Pt transported to day room gym dependently in [facility] for energy conservation.   Therapeutic Activity: Transfers: Pt performed sit<>stand transfers throughout session using RW with modA due to pt reporting fatigue from previous therapy sessions this week. Provided VC for increased anterior scoot and to use L UE to push off of arm rest of WC vs lower bar, and to reach back to arm rest to control descent with minA. Pt also provided with WC to step back to surfaces until back of knee is felt on edge. Pt presented with B knees in flexion when sitting to NuStep due to pt reporting soreness in quadriceps.   Therapeutic Exercise: Pt performed the following exercises with therapist providing the described cuing and facilitation for improvement. - NuStep level 3 with cues to stay around 40 steps per minute. Pt performed 425 steps in 8 minutes with HR staying around 75 BPM (checked at 4 minutes) and SpO2 98%. Pt did not require a rest break.  Patient sitting in WC at end of session with brakes locked, belt alarm set, and all needs within reach.  SESSION 1 Skilled Therapeutic Interventions/Progress Updates: Patient sitting in [facility] with family present on entrance to room.  Patient alert and agreeable to PT session.   Patient reported no pain or change in status since earlier session. Pt transported to main gym in [facility] dependently for energy conservation. Pt transferred from WC<hi/low mat with modA due to B LE soreness in quadriceps and the low height of WC (transfer from hi/low mat<WC with CGA due to elevated height). Pt required mod cues to anteriorly scoot to edge, as well as increasing forward lean with one UE to push vs B UE on RW with PTA providing education on safety mechanics. Pt also cued to reach back to surfaces to control descent. Pt performed x 8 sit to stands from hi/low mat with CGA (mat elevated) with same VC for transfers. Pt then required seated rest break. Pt performed seated LAQ without resistance or added weight (attempted 5lbs but pt could not perform without compensating with posterior trunk lean). Pt performed 3 x 10 on B LE's with mod cues for pt to control movement focusing on eccentric control (PTA attempted to adjust WC at this time but was unable to do so due to L bolts not being able to loosen - unable to locate another 22x18 WC at that time). Pt then transferred back to [facility] from hi/low mat with CGA that transitioned to light minA due to safety as pt presented with B knees in flexion beginning to buckle. Pt then transported back to room in [facility].  Patient sitting in WC at end of session with brakes locked, belt alarm set, and all needs  within reach.       Therapy Documentation Precautions:  Precautions Precautions: Fall Precaution Comments: incontinence Restrictions Weight Bearing Restrictions: No  Therapy/Group: Individual Therapy  Airyonna Franklyn PTA 11/11/2023, 12:09 PM

## 2023-11-11 NOTE — Progress Notes (Signed)
PROGRESS NOTE   Subjective/Complaints: Patient still with significant bowel and bladder incontinence.  He endorses this is only overnight, and that he generally sleeps through incontinent episodes.  Is able to get up to the toilet during the day.  He denies being on Flomax at home. Patient nodded head TED hose this morning, did have lotion applied to legs yesterday but PT unable to find someone to assist with lymphedema wraps. He denies any pain in the lower extremities.   ROS: + bowel and bladder incontinence -ongoing, bilateral lower extremity swelling-ongoing. denies fevers, chills, N/V, abdominal pain, constipation, diarrhea, SOB, cough, chest pain, new weakness or paraesthesias.    Objective:   No results found. Recent Labs    11/09/23 0518 11/10/23 0702  WBC 4.2 4.4  HGB 10.7* 11.4*  HCT 30.4* 33.0*  PLT 150 145*   Recent Labs    11/09/23 0518 11/10/23 0702  NA 133* 134*  K 4.0 4.1  CL 108 109  CO2 21* 20*  GLUCOSE 96 98  BUN 7 6  CREATININE 0.53* 0.51*  CALCIUM 8.5* 8.4*    Intake/Output Summary (Last 24 hours) at 11/11/2023 1430 Last data filed at 11/11/2023 1337 Gross per 24 hour  Intake 714 ml  Output --  Net 714 ml     Pressure Injury 11/08/23 Heel Right Deep Tissue Pressure Injury - Purple or maroon localized area of discolored intact skin or blood-filled blister due to damage of underlying soft tissue from pressure and/or shear. (Active)  11/08/23 1600  Location: Heel  Location Orientation: Right  Staging: Deep Tissue Pressure Injury - Purple or maroon localized area of discolored intact skin or blood-filled blister due to damage of underlying soft tissue from pressure and/or shear.  Wound Description (Comments):   Present on Admission: Yes    Physical Exam: Vital Signs Blood pressure (!) 109/55, pulse 79, temperature 99 F (37.2 C), temperature source Oral, resp. rate 18, height 5\' 11"   (1.803 m), weight 121 kg, SpO2 93%. Constitutional: No apparent distress. Appropriate appearance for age.  Morbidly obese.  HENT: No JVD. Neck Supple. Trachea midline. Atraumatic, normocephalic. + Goiter Eyes: PERRLA. EOMI. Visual fields grossly intact.  Cardiovascular: RRR, no murmurs/rub/gallops. 3+ bilateral pitting edema in the feet, with significant lymphedema of bilateral shins Respiratory: CTAB. No rales, rhonchi, or wheezing. On RA.  Abdomen: + bowel sounds, normoactive. Mild distention, no  tenderness.  Skin:  + Bilateral chronic lower extremity skin thickening and discolorations; no open areas.  Flaking, dry skin over bilateral shins and feet, between toes.  Some skin flaked off and revealed underlying healthy, pink tissue on right first toe. + Bilateral buttocks and coccygeal MASD + Right heel deep tissue injury, unstageable, with underlying bruise            MSK:      No apparent deformity.      Strength: Antigravity and against resistance in bilateral upper extremities, 4 out of 5.  Bilateral lower extremities, 3 out of 5 hip flexion, otherwise 4- out of 5 bilaterally.  Neurologic exam:  Cognition: [name] to person, place, time + Cognitive slowing, poor insight, memory deficit -improving daily Language: Fluent, No substitutions or  neoglisms. No dysarthria. Insight: Good insight into current condition.  Mood: Pleasant affect, appropriate mood.  Sensation: Equal and intact in BL UE and Les.  Coordination: No apparent tremors.  Mild bilateral upper extremity ataxia on finger-to-nose.-Mild   Assessment/Plan: 1. Functional deficits which require 3+ hours per day of interdisciplinary therapy in a comprehensive inpatient rehab setting. Physiatrist is providing close team supervision and 24 hour management of active medical problems listed below. Physiatrist and rehab team continue to assess barriers to discharge/monitor patient progress toward functional and medical  goals  Care Tool:  Bathing    Body parts bathed by patient: Right arm, Left arm, Chest, Abdomen, Face   Body parts bathed by helper: Front perineal area, Buttocks, Right upper leg, Left upper leg, Right lower leg, Left lower leg     Bathing assist Assist Level: Maximal Assistance - Patient 24 - 49%     Upper Body Dressing/Undressing Upper body dressing   What is the patient wearing?: Pull over shirt    Upper body assist Assist Level: Minimal Assistance - Patient > 75%    Lower Body Dressing/Undressing Lower body dressing      What is the patient wearing?: Pants, Incontinence brief     Lower body assist Assist for lower body dressing: Total Assistance - Patient < 25%     Toileting Toileting    Toileting assist Assist for toileting: Dependent - Patient 0%     Transfers Chair/bed transfer  Transfers assist  Chair/bed transfer activity did not occur: Safety/medical concerns (Unsafet to get out of bed.)  Chair/bed transfer assist level: Dependent - mechanical lift     Locomotion Ambulation   Ambulation assist   Ambulation activity did not occur: Safety/medical concerns          Walk 10 feet activity   Assist  Walk 10 feet activity did not occur: Safety/medical concerns        Walk 50 feet activity   Assist Walk 50 feet with 2 turns activity did not occur: Safety/medical concerns         Walk 150 feet activity   Assist Walk 150 feet activity did not occur: Safety/medical concerns         Walk 10 feet on uneven surface  activity   Assist Walk 10 feet on uneven surfaces activity did not occur: Safety/medical concerns         Wheelchair     Assist Is the patient using a wheelchair?: Yes Type of Wheelchair: Manual    Wheelchair assist level: Dependent - Patient 0% Max wheelchair distance: 150    Wheelchair 50 feet with 2 turns activity    Assist        Assist Level: Dependent - Patient 0%   Wheelchair 150  feet activity     Assist      Assist Level: Dependent - Patient 0%   Blood pressure (!) 109/55, pulse 79, temperature 99 F (37.2 C), temperature source Oral, resp. rate 18, height 5\' 11"  (1.803 m), weight 121 kg, SpO2 93%.  Medical Problem List and Plan: 1. Functional deficits secondary to acute encephalopathy, hepatic and possible metabolic component             -patient may shower             -ELOS/Goals: 10-14 days S             - Stable to continue CIR  2.  Antithrombotics: -DVT/anticoagulation:  Pharmaceutical: Lovenox             -  antiplatelet therapy: N/A 3.Left knee pain: XR ordered, voltaren gel ordered   - 11/21: No complaints today. 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  May benefit from hinged knee brace when weightbearing, or injection if medically stable.  Monitor today for therapy assessments.  - 11/22: With PT, denied pain during session yesterday with ambulation and transfers. No adjustments at this time.   4. Mood/Behavior/Sleep: Provide emotional support             -antipsychotic agents: N/A 5. Neuropsych/cognition: This patient is capable of making decisions on his own behalf. 6. Skin/Wound Care: Routine skin checks..Excoriation of buttocks with wound care as directed -Low-air-loss mattress, Mepilex dressing to bilateral heels to prevent breakdown--add Sage boot RLE 11/22. -Goodhart's Butt cream to prevent worsening MASD -Hydrocyn cream to bilateral legs for flaking, dry skin -11-23: Nursing order to increase cream to twice daily, over legs and feet, and to apply Ace wrapping from feet to mid shin to prevent worsening dependent edema.   7. Fluids/Electrolytes/Nutrition: Routine in and outs with follow-up chemistries  -11-21: Sodium 133, repeat in a.m. for trend.  Otherwise labs stable.  11-22: Improved 134 today; next labs on Monday, monitor for cognitive changes  8.  Colitis versus ileitis-acute appendicitis  ruled out.  Follow-up outpatient general surgery 9.  Hypertension.  Blood pressure remains a bit soft remains on ProAmatine 10 mg 3 times daily as well as low-dose Coreg 12.5 mg twice daily.   - 11/21: Diastolic hypotension as below; resume midodrine 5 mg 2 times daily and wean as tolerated -stable 11-23     11/11/2023    2:48 AM 11/10/2023    8:08 PM 11/10/2023    1:12 PM  Vitals with BMI  Systolic 109 120 161  Diastolic 55 62 55  Pulse 79 86 76    10.  Chronic lymphedema.  Follow-up outpatient.  -PT order for lymphedema wrapping and assistance and education, management 11-22 -11-23: Per PT, only 1 PT in the hospital provide lymphedema management, will look into getting them in the room this coming week.  In the meantime, bilateral lower extremity Ace wrapped and Hydrocyn cream twice daily.  11.History of alcohol use.  Counseling 12.  Tobacco use.  Counseling 13.  Obesity.  BMI 37.08.  Dietary follow-up 14.  Prediabetes.  Hemoglobin A1c 5.8. d/c ISS and start metformin ER release with dinner, creatinine reviewed and is stable -Blood sugars well-controlled, DC BG checks Recent Labs    11/08/23 2350 11/09/23 0352 11/09/23 1207  GLUCAP 112* 99 98     15.  BPH/bladder incontinence/bowel incontinence.  Flomax 0.4 mg daily. BP reviewed and is stable   -11-24: DC Flomax, continue timed toileting and PVRs   16. Hyperammonemia.  Hepatic steatosis seen on intake CT.  - 11/21: Seems more encephalopathic today, last ammonia was increased from 40s to 60s, add on to a.m. labs and resume lactulose 7.5 mg twice daily if worsening  11-22: Ammonia within normal limits.  Continues having 4-5 stools per day.  Change lactulose to 10 mg twice daily as needed to target at least 3 stools per day.  Cognition does seem to be improving.  17. Anemia.  hemoglobin decreased on intake from 12,6 to 10.7.  FOBT, H&H in AM. -11-22: Hemoglobin improved to 11.4, FOBT pending but no obvious blood in  stool.  LOS: 3 days A FACE TO FACE EVALUATION WAS PERFORMED  Angelina Sheriff 11/11/2023, 2:30 PM

## 2023-11-11 NOTE — Plan of Care (Signed)
  Problem: Consults Goal: RH GENERAL PATIENT EDUCATION Description: See Patient Education module for education specifics. Outcome: Progressing Goal: Skin Care Protocol Initiated - if Braden Score 18 or less Description: If consults are not indicated, leave blank or document N/A Outcome: Progressing   Problem: RH BOWEL ELIMINATION Goal: RH STG MANAGE BOWEL WITH ASSISTANCE Description: STG Manage Bowel with min Assistance. Outcome: Progressing Goal: RH STG MANAGE BOWEL W/MEDICATION W/ASSISTANCE Description: STG Manage Bowel with Medication with min Assistance. Outcome: Progressing   Problem: RH BLADDER ELIMINATION Goal: RH STG MANAGE BLADDER WITH ASSISTANCE Description: STG Manage Bladder With min Assistance Outcome: Progressing   Problem: RH SKIN INTEGRITY Goal: RH STG SKIN FREE OF INFECTION/BREAKDOWN Description: MASD will improve and skin will be free of infection without additional breakdown with min assist  Outcome: Progressing Goal: RH STG MAINTAIN SKIN INTEGRITY WITH ASSISTANCE Description: STG Maintain Skin Integrity With min Assistance. Outcome: Progressing Goal: RH STG ABLE TO PERFORM INCISION/WOUND CARE W/ASSISTANCE Description: STG Able To Perform Incision/Wound Care With min Assistance. Outcome: Progressing   Problem: RH SAFETY Goal: RH STG ADHERE TO SAFETY PRECAUTIONS W/ASSISTANCE/DEVICE Description: STG Adhere to Safety Precautions With min Assistance/Device. Outcome: Progressing Goal: RH STG DECREASED RISK OF FALL WITH ASSISTANCE Description: STG Decreased Risk of Fall With cueing Assistance. Outcome: Progressing   Problem: RH PAIN MANAGEMENT Goal: RH STG PAIN MANAGED AT OR BELOW PT'S PAIN GOAL Description: Less than 2 with PRN medications min assist  Outcome: Progressing   Problem: RH KNOWLEDGE DEFICIT GENERAL Goal: RH STG INCREASE KNOWLEDGE OF SELF CARE AFTER HOSPITALIZATION Description: Patient/caregiver will be able to manage medications, self care,  diet/lifestyle modifications to improve overall health from nursing education, nursing handouts and other resources independently  Outcome: Progressing   Problem: Education: Goal: Ability to describe self-care measures that may prevent or decrease complications (Diabetes Survival Skills Education) will improve Outcome: Progressing Goal: Individualized Educational Video(s) Outcome: Progressing   Problem: Coping: Goal: Ability to adjust to condition or change in health will improve Outcome: Progressing   Problem: Fluid Volume: Goal: Ability to maintain a balanced intake and output will improve Outcome: Progressing   Problem: Health Behavior/Discharge Planning: Goal: Ability to identify and utilize available resources and services will improve Outcome: Progressing Goal: Ability to manage health-related needs will improve Outcome: Progressing   Problem: Metabolic: Goal: Ability to maintain appropriate glucose levels will improve Outcome: Progressing   Problem: Nutritional: Goal: Maintenance of adequate nutrition will improve Outcome: Progressing Goal: Progress toward achieving an optimal weight will improve Outcome: Progressing   Problem: Skin Integrity: Goal: Risk for impaired skin integrity will decrease Outcome: Progressing   Problem: Tissue Perfusion: Goal: Adequacy of tissue perfusion will improve Outcome: Progressing   Problem: Consults Goal: RH GENERAL PATIENT EDUCATION Description: See Patient Education module for education specifics. Outcome: Progressing

## 2023-11-11 NOTE — Progress Notes (Signed)
Occupational Therapy Session Note  Patient Details  Name: Brian [name] MRN: [id number] Date of Birth: March 28, 1961  Today's Date: 11/11/2023 OT Individual Time: 7829-5621 OT Individual Time Calculation (min): 60 min    Short Term Goals:      Week 1:  OT Short Term Goal 1 (Week 1): Patient will complete toilet transfer with max A of 1 OT Short Term Goal 2 (Week 1): Pt will complete 1 step of toileting OT Short Term Goal 3 (Week 1): Patient will stand with max A of 1  Skilled Therapeutic Interventions/Progress Updates:    Pt greeted sidelying in bed with nursing finishing up wound care and peri-care with pt after incontinent BM. Pt able to roll with min A. Pt then completed bed mobility with min A. We worked on threading pant legs in sitting EOB with pt having difficulty coordinating lifting one LE while also pulling pant leg, overall requiring Max  A for LB dressing. Sit<>stand from bed with RW and mod A. Then Mod A to pivot to wc w/ RW. Pt brought to the sink in wc and completed UB bathing/dressing with set-up A.UB strengthening/endurance with wc propulsion in hallway. Pt needed 2 rest breaks to make it to ortho gym. UB there-ex using [employer] arm bike Pt completed 5 minutes forward x5 minutes backwards. Pt returned to room and left seated in wc with alarm on, call bell in reach, and needs met.   Therapy Documentation Precautions:  Precautions Precautions: Fall Precaution Comments: incontinence Restrictions Weight Bearing Restrictions: No Pain:  0/10 none reported   Therapy/Group: Individual Therapy  Mal Amabile 11/11/2023, 10:16 AM

## 2023-11-11 NOTE — IPOC Note (Signed)
Overall Plan of Care [facility]) Patient Details Name: Brian [name] MRN: [id number] DOB: Feb 21, 1961  Admitting Diagnosis: Acute metabolic encephalopathy  Hospital Problems: Principal Problem:   Acute metabolic encephalopathy     Functional Problem List: Nursing Bladder, Bowel, Edema, Endurance, Medication Management, Safety, Sensory, Skin Integrity  PT Balance, Endurance, Pain, Safety, Skin Integrity, Motor  OT Balance, Behavior, Endurance, Motor, Sensory, Skin Integrity  SLP Cognition, Safety  TR         Basic ADL's: OT Grooming, Bathing, Dressing, Toileting     Advanced  ADL's: OT       Transfers: PT Bed Mobility, Bed to Chair  OT Toilet     Locomotion: PT Ambulation, Wheelchair Mobility, Stairs     Additional Impairments: OT Fuctional Use of Upper Extremity  SLP Social Cognition   Problem Solving, Memory, Attention, Awareness  TR      Anticipated Outcomes Item Anticipated Outcome  Self Feeding    Swallowing      Basic self-care  Min A  Toileting  Min A   Bathroom Transfers Supervision``  Bowel/Bladder  regain continence of bowel/bladder  Transfers  CGA  Locomotion  CGA ambulatory  Communication     Cognition  supervision-min  Pain  less than 2  Safety/Judgment  with cues   Therapy Plan: PT Intensity: Minimum of 1-2 x/day ,45 to 90 minutes PT Frequency: 5 out of 7 days PT Duration Estimated Length of Stay: 3 weeks OT Intensity: Minimum of 1-2 x/day, 45 to 90 minutes OT Frequency: 5 out of 7 days OT Duration/Estimated Length of Stay: 14-17 days SLP Intensity: Minumum of 1-2 x/day, 30 to 90 minutes SLP Frequency: 3 to 5 out of 7 days SLP Duration/Estimated Length of Stay: 14-17 days   Team Interventions: Nursing Interventions Patient/Family Education, Bladder Management, Bowel Management, Disease Management/Prevention, Medication Management, Skin Care/Wound Management, Cognitive Remediation/Compensation, Dysphagia/Aspiration Precaution  Training, Discharge Planning, Psychosocial Support  PT interventions Ambulation/gait training, Balance/vestibular training, DME/adaptive equipment instruction, Psychosocial support, Skin care/wound management, UE/LE Strength taining/ROM, UE/LE Coordination activities, Cognitive remediation/compensation, Functional mobility training, Splinting/orthotics, Visual/perceptual remediation/compensation, Firefighter, Neuromuscular re-education, Museum/gallery curator, Wheelchair propulsion/positioning, Discharge planning, Pain management, Therapeutic Activities, Disease management/prevention, Equities trader education, Therapeutic Exercise  OT Interventions Warden/ranger, Cognitive remediation/compensation, Community reintegration, Discharge planning, Disease mangement/prevention, DME/adaptive equipment instruction, Functional electrical stimulation, Functional mobility training, Neuromuscular re-education, Pain management, Patient/family education, Psychosocial support, Self Care/advanced ADL retraining, Skin care/wound managment, Splinting/orthotics, Therapeutic Activities, Therapeutic Exercise, UE/LE Strength taining/ROM, UE/LE Coordination activities, Visual/perceptual remediation/compensation, Wheelchair propulsion/positioning  SLP Interventions Cognitive remediation/compensation, Cueing hierarchy, Functional tasks, Therapeutic Activities, Internal/external aids, Patient/family education  TR Interventions    SW/CM Interventions Discharge Planning, Psychosocial Support, Patient/Family Education   Barriers to Discharge MD  Medical stability, Home enviroment access/loayout, Incontinence, Wound care, Lack of/limited family support, Insurance for SNF coverage, and Weight  Nursing Decreased caregiver support, Home environment access/layout, Incontinence, Wound Care, Lack of/limited family support, Medication compliance 1/2 bath main level- sleeps on couch or in recliner. 1 ste entry no rails  PT  Decreased caregiver support, Inaccessible home environment, Home environment access/layout wife unable to physically assist, 1 STE  OT      SLP      SW Decreased caregiver support, Lack of/limited family support, Community education officer for SNF coverage     Team Discharge Planning: Destination: PT-Home ,OT- Home , SLP-Home Projected Follow-up: PT-Home health PT, OT-  Home health OT, 24 hour supervision/assistance, SLP-Home Health SLP Projected Equipment Needs: PT-To be determined, OT- To be determined, SLP-None recommended  by SLP Equipment Details: PT-owns RW only, OT-  Patient/family involved in discharge planning: PT- Patient,  OT-Patient, SLP-Patient  MD ELOS: 10-14 days Medical Rehab Prognosis:  Fair Assessment: The patient has been admitted for CIR therapies with the diagnosis of 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 The team will be addressing functional mobility, strength, stamina, balance, safety, adaptive techniques and equipment, self-care, bowel and bladder mgt, patient and caregiver education. Goals have been set at supervision . Anticipated discharge destination is home.       See Team Conference Notes for weekly updates to the plan of care

## 2023-11-12 ENCOUNTER — Inpatient Hospital Stay (HOSPITAL_COMMUNITY): Payer: Medicaid Other

## 2023-11-12 LAB — OCCULT BLOOD X 1 CARD TO LAB, STOOL: Fecal Occult Bld: NEGATIVE

## 2023-11-12 MED ORDER — LOPERAMIDE HCL 2 MG PO CAPS: 2.0000 mg | ORAL_CAPSULE | Freq: Two times a day (BID) | ORAL | Status: DC | PRN

## 2023-11-12 NOTE — Progress Notes (Addendum)
  Lower extremities washed, dried, Eucerin applied followed by ace wrap application per MD order.      Tilden Dome, LPN

## 2023-11-12 NOTE — Plan of Care (Signed)
  Problem: Consults Goal: RH GENERAL PATIENT EDUCATION Description: See Patient Education module for education specifics. Outcome: Progressing Goal: Skin Care Protocol Initiated - if Braden Score 18 or less Description: If consults are not indicated, leave blank or document N/A Outcome: Progressing   Problem: RH BOWEL ELIMINATION Goal: RH STG MANAGE BOWEL WITH ASSISTANCE Description: STG Manage Bowel with min Assistance. Outcome: Progressing Goal: RH STG MANAGE BOWEL W/MEDICATION W/ASSISTANCE Description: STG Manage Bowel with Medication with min Assistance. Outcome: Progressing   Problem: RH BLADDER ELIMINATION Goal: RH STG MANAGE BLADDER WITH ASSISTANCE Description: STG Manage Bladder With min Assistance Outcome: Progressing   Problem: RH SKIN INTEGRITY Goal: RH STG SKIN FREE OF INFECTION/BREAKDOWN Description: MASD will improve and skin will be free of infection without additional breakdown with min assist  Outcome: Progressing Goal: RH STG MAINTAIN SKIN INTEGRITY WITH ASSISTANCE Description: STG Maintain Skin Integrity With min Assistance. Outcome: Progressing Goal: RH STG ABLE TO PERFORM INCISION/WOUND CARE W/ASSISTANCE Description: STG Able To Perform Incision/Wound Care With min Assistance. Outcome: Progressing   Problem: RH SAFETY Goal: RH STG ADHERE TO SAFETY PRECAUTIONS W/ASSISTANCE/DEVICE Description: STG Adhere to Safety Precautions With min Assistance/Device. Outcome: Progressing Goal: RH STG DECREASED RISK OF FALL WITH ASSISTANCE Description: STG Decreased Risk of Fall With cueing Assistance. Outcome: Progressing   Problem: RH PAIN MANAGEMENT Goal: RH STG PAIN MANAGED AT OR BELOW PT'S PAIN GOAL Description: Less than 2 with PRN medications min assist  Outcome: Progressing   Problem: RH KNOWLEDGE DEFICIT GENERAL Goal: RH STG INCREASE KNOWLEDGE OF SELF CARE AFTER HOSPITALIZATION Description: Patient/caregiver will be able to manage medications, self care,  diet/lifestyle modifications to improve overall health from nursing education, nursing handouts and other resources independently  Outcome: Progressing   Problem: Education: Goal: Ability to describe self-care measures that may prevent or decrease complications (Diabetes Survival Skills Education) will improve Outcome: Progressing Goal: Individualized Educational Video(s) Outcome: Progressing   Problem: Coping: Goal: Ability to adjust to condition or change in health will improve Outcome: Progressing   Problem: Fluid Volume: Goal: Ability to maintain a balanced intake and output will improve Outcome: Progressing   Problem: Health Behavior/Discharge Planning: Goal: Ability to identify and utilize available resources and services will improve Outcome: Progressing Goal: Ability to manage health-related needs will improve Outcome: Progressing   Problem: Metabolic: Goal: Ability to maintain appropriate glucose levels will improve Outcome: Progressing   Problem: Nutritional: Goal: Maintenance of adequate nutrition will improve Outcome: Progressing Goal: Progress toward achieving an optimal weight will improve Outcome: Progressing   Problem: Skin Integrity: Goal: Risk for impaired skin integrity will decrease Outcome: Progressing   Problem: Tissue Perfusion: Goal: Adequacy of tissue perfusion will improve Outcome: Progressing   Problem: Consults Goal: RH GENERAL PATIENT EDUCATION Description: See Patient Education module for education specifics. Outcome: Progressing

## 2023-11-12 NOTE — Progress Notes (Cosign Needed Addendum)
Pt is [name] x 4. S1, S2 heard and pulses are equal bilaterally. No wheezing or SOB noted, lung sound clear. Motor response is strong in UE & LE. No Therapy scheduled for today.The pt states that he's last BM was earlier this morning, BS are active. No nausea, vomiting, diarrhea, or constipation. He does not c/o any abdominal cramping or pain. Abdomen seems to be a little hard. Pt is urinary incontinence but 1 unmeasured urine occurrence was noted. He does not c/o any pain while urinating. Brief was changed. Edema in LE was noted. Skin also presented with Erythema, ecchymosis, and dry on LE bilaterally. Derma Cream was applied to LE. Did notice a small skin tear under L. Butt cheek. When asked about pain, he stated that he didn't have any pain (0/10 on 0-10 scale). The pt wears eyeglasses (slight drooping of the R. Eye), missing upper teeth no dentures or hearing aids indicated. Some dry skin noted in both ears.

## 2023-11-12 NOTE — Progress Notes (Signed)
Pt is [name] x 4. S1, S2 heard and radial and dorsalis pedis pulses are equal bilaterally. No wheezing or SOB noted, lungs sound clear. Motor response is strong in UE & LE. No Therapy scheduled for today.The pt states that his last BM was earlier this morning, BS are active. No nausea, vomiting, diarrhea, or constipation. He does not c/o any abdominal cramping or pain. Abdomen seems to be a little hard. Pt is urinary incontinent with 1 unmeasured urine occurrence. He does not c/o any pain while urinating. Brief was changed. Edema in BLE was noted. Skin also presented with erythema, ecchymosis, and dry on LE bilaterally. Derma Cream was applied to LE. Did notice a small skin tear under left buttocks. When asked about pain, he stated that he didn't have any pain (0/10 on 0-10 scale). The pt wears eyeglasses (slight drooping of the R. Eye), no dentures or hearing aids indicated. Some dry skin noted in both ears.    I agree with this student's documentation.

## 2023-11-12 NOTE — Progress Notes (Signed)
PROGRESS NOTE   Subjective/Complaints: PVR low, 66 1x yesterday. Still with lots of overnight bowel and bladder incontinence.  BP soft but vitals stable.   Patient seen in bed, laying down.  Endorses no pain or needs at this time.  States his legs feel much improved since wrapping.  Denies any fevers, chills, nausea.  States that he is peeing well, has been continent today.  Does have urinal at bedside which appears full.  ROS: + bowel and bladder incontinence -ongoing, generally at night.  Bilateral lower extremity swelling-mildly improved with wraps. denies fevers, chills, N/V, abdominal pain, constipation, diarrhea, SOB, cough, chest pain, new weakness or paraesthesias.    Objective:   No results found. Recent Labs    11/10/23 0702  WBC 4.4  HGB 11.4*  HCT 33.0*  PLT 145*   Recent Labs    11/10/23 0702  NA 134*  K 4.1  CL 109  CO2 20*  GLUCOSE 98  BUN 6  CREATININE 0.51*  CALCIUM 8.4*    Intake/Output Summary (Last 24 hours) at 11/12/2023 0835 Last data filed at 11/11/2023 1908 Gross per 24 hour  Intake 713 ml  Output 200 ml  Net 513 ml     Pressure Injury 11/08/23 Heel Right Deep Tissue Pressure Injury - Purple or maroon localized area of discolored intact skin or blood-filled blister due to damage of underlying soft tissue from pressure and/or shear. (Active)  11/08/23 1600  Location: Heel  Location Orientation: Right  Staging: Deep Tissue Pressure Injury - Purple or maroon localized area of discolored intact skin or blood-filled blister due to damage of underlying soft tissue from pressure and/or shear.  Wound Description (Comments):   Present on Admission: Yes    Physical Exam: Vital Signs Blood pressure (!) 111/54, pulse 79, temperature 98.6 F (37 C), temperature source Oral, resp. rate 18, height 5\' 11"  (1.803 m), weight 121 kg, SpO2 95%. Constitutional: No apparent distress. Appropriate  appearance for age.  Morbidly obese.  Laying in bed. HENT: PERRLA.  Atraumatic, normocephalic. + Goiter Cardiovascular: RRR, no murmurs/rub/gallops. 2+ bilateral pitting and lymphedema from the knee down,-wrapped bilaterally and Ace wraps Respiratory: CTAB. No rales, rhonchi, or wheezing. On RA.  Abdomen: + bowel sounds, normoactive.  Nondistended, no  tenderness.  Skin:  + Bilateral chronic lower extremity skin thickening and discolorations; no open areas.  Flaking, dry skin over bilateral shins and feet, between toes -looks much better after cream application and wrapping  + Bilateral buttocks and coccygeal MASD-not examined + Right heel deep tissue injury, unstageable, with underlying bruise-appears dry, stable today            MSK:      No apparent deformity.   Neurologic exam:  Cognition: [name] to person, place, time + Poor insight, memory deficit-ongoing + Cognitive slowing-much improved  Strength: Antigravity and against resistance in bilateral upper extremities, 4 out of 5.  Bilateral lower extremities, 3 out of 5 hip flexion, otherwise 4- out of 5 bilaterally  Insight: Good insight into current condition.  Mood: Pleasant affect, appropriate mood.  Sensation: Equal and intact in BL UE and Les.  Coordination: Mild bilateral upper extremity ataxia on finger-to-nose  Assessment/Plan: 1. Functional deficits which require 3+ hours per day of interdisciplinary therapy in a comprehensive inpatient rehab setting. Physiatrist is providing close team supervision and 24 hour management of active medical problems listed below. Physiatrist and rehab team continue to assess barriers to discharge/monitor patient progress toward functional and medical goals  Care Tool:  Bathing    Body parts bathed by patient: Right arm, Left arm, Chest, Abdomen, Face   Body parts bathed by helper: Front perineal area, Buttocks, Right upper leg, Left upper leg, Right lower leg, Left lower leg      Bathing assist Assist Level: Maximal Assistance - Patient 24 - 49%     Upper Body Dressing/Undressing Upper body dressing   What is the patient wearing?: Pull over shirt    Upper body assist Assist Level: Minimal Assistance - Patient > 75%    Lower Body Dressing/Undressing Lower body dressing      What is the patient wearing?: Pants, Incontinence brief     Lower body assist Assist for lower body dressing: Total Assistance - Patient < 25%     Toileting Toileting    Toileting assist Assist for toileting: Dependent - Patient 0%     Transfers Chair/bed transfer  Transfers assist  Chair/bed transfer activity did not occur: Safety/medical concerns (Unsafet to get out of bed.)  Chair/bed transfer assist level: Dependent - mechanical lift     Locomotion Ambulation   Ambulation assist   Ambulation activity did not occur: Safety/medical concerns          Walk 10 feet activity   Assist  Walk 10 feet activity did not occur: Safety/medical concerns        Walk 50 feet activity   Assist Walk 50 feet with 2 turns activity did not occur: Safety/medical concerns         Walk 150 feet activity   Assist Walk 150 feet activity did not occur: Safety/medical concerns         Walk 10 feet on uneven surface  activity   Assist Walk 10 feet on uneven surfaces activity did not occur: Safety/medical concerns         Wheelchair     Assist Is the patient using a wheelchair?: Yes Type of Wheelchair: Manual    Wheelchair assist level: Dependent - Patient 0% Max wheelchair distance: 150    Wheelchair 50 feet with 2 turns activity    Assist        Assist Level: Dependent - Patient 0%   Wheelchair 150 feet activity     Assist      Assist Level: Dependent - Patient 0%   Blood pressure (!) 111/54, pulse 79, temperature 98.6 F (37 C), temperature source Oral, resp. rate 18, height 5\' 11"  (1.803 m), weight 121 kg, SpO2  95%.  Medical Problem List and Plan: 1. Functional deficits secondary to acute encephalopathy, hepatic and possible metabolic component             -patient may shower             -ELOS/Goals: 10-14 days S             - Stable to continue CIR  2.  Antithrombotics: -DVT/anticoagulation:  Pharmaceutical: Lovenox             -antiplatelet therapy: N/A 3.Left knee pain: XR ordered, voltaren gel ordered   - 11/21: No complaints today. 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  May benefit from hinged  knee brace when weightbearing, or injection if medically stable.  Monitor today for therapy assessments.  - 11/22: With PT, denied pain during session yesterday with ambulation and transfers. No adjustments at this time.   4. Mood/Behavior/Sleep: Provide emotional support             -antipsychotic agents: N/A 5. Neuropsych/cognition: This patient is capable of making decisions on his own behalf. 6. Skin/Wound Care: Routine skin checks..Excoriation of buttocks with wound care as directed -Low-air-loss mattress, Mepilex dressing to bilateral heels to prevent breakdown--add [employer] RLE 11/22-not yet in room 11-24. -Goodhart's Butt cream to prevent worsening MASD -Hydrocyn cream to bilateral legs for flaking, dry skin -11-23: Nursing order to increase cream to twice daily, over legs and feet, and to apply Ace wrapping from feet to mid shin to prevent worsening dependent edema. 11-24: Much improved appearance of bilateral lower extremities, continue regimen of wrapping and hydrating cream.   7. Fluids/Electrolytes/Nutrition: Routine in and outs with follow-up chemistries  -11-21: Sodium 133, repeat in a.m. for trend.  Otherwise labs stable.  11-22: Improved 134 today; next labs on Monday, monitor for cognitive changes  8.  Colitis versus ileitis-acute appendicitis ruled out.  Follow-up outpatient general surgery  11-24: Remains with frequent loose bowel  movements overnight.  Lactulose held/as needed.  Will need to continue targeted bowel movements at least 2 to 3/day, however did add Imodium for excessive bowel movements.  No current concern for infection, vital stable and no white count.  9.  Hypertension.  Blood pressure remains a bit soft remains on ProAmatine 10 mg 3 times daily as well as low-dose Coreg 12.5 mg twice daily.   - 11/21: Diastolic hypotension as below; resume midodrine 5 mg 2 times daily and wean as tolerated -stable 11-23     11/12/2023    7:51 AM 11/12/2023    5:32 AM 11/11/2023    7:20 PM  Vitals with BMI  Systolic 111 117 119  Diastolic 54 59 69  Pulse 79 72 80    10.  Chronic lymphedema.  Follow-up outpatient.  -PT order for lymphedema wrapping and assistance and education, management 11-22 -11-23: Per PT, only 1 PT in the hospital provide lymphedema management, looking into getting them in the room to assist in education this coming week -11/24 nursing providing Ace wrapping to bilateral lower extremities, does appear to be helping.  11.History of alcohol use.  Counseling 12.  Tobacco use.  Counseling 13.  Obesity.  BMI 37.08.  Dietary follow-up 14.  Prediabetes.  Hemoglobin A1c 5.8. d/c ISS and start metformin ER release with dinner, creatinine reviewed and is stable -Blood sugars well-controlled, DC BG checks Recent Labs    11/09/23 1207  GLUCAP 98     15.  BPH/bladder incontinence/bowel incontinence.  Flomax 0.4 mg daily. BP reviewed and is stable   -11-23: DC Flomax, continue timed toileting and PVRs  11-24: PVRs remain low, remains continent during the day and incontinent at night.  Continue timed toileting.   16. Hyperammonemia.  Hepatic steatosis seen on intake CT.  - 11/21: Seems more encephalopathic today, last ammonia was increased from 40s to 60s, add on to a.m. labs and resume lactulose 7.5 mg twice daily if worsening  11-22: Ammonia within normal limits.  Continues having 4-5 stools per  day.  Change lactulose to 10 mg twice daily as needed to target at least 3 stools per day.  Cognition does seem to be improving.  17. Anemia.  hemoglobin decreased  on intake from 12,6 to 10.7.  FOBT, H&H in AM. -11-22: Hemoglobin improved to 11.4, FOBT pending but no obvious blood in stool.  LOS: 4 days A FACE TO FACE EVALUATION WAS PERFORMED  Angelina Sheriff 11/12/2023, 8:35 AM

## 2023-11-12 NOTE — Progress Notes (Signed)
Stool sample collected and sent to lab.   Tilden Dome, LPN

## 2023-11-13 DIAGNOSIS — I10 Essential (primary) hypertension: Secondary | ICD-10-CM

## 2023-11-13 DIAGNOSIS — D649 Anemia, unspecified: Secondary | ICD-10-CM

## 2023-11-13 DIAGNOSIS — I890 Lymphedema, not elsewhere classified: Secondary | ICD-10-CM

## 2023-11-13 DIAGNOSIS — R7303 Prediabetes: Secondary | ICD-10-CM

## 2023-11-13 LAB — COMPREHENSIVE METABOLIC PANEL
ALT: 29 U/L (ref 0–44)
AST: 40 U/L (ref 15–41)
Albumin: 2.2 g/dL — ABNORMAL LOW (ref 3.5–5.0)
Alkaline Phosphatase: 107 U/L (ref 38–126)
Anion gap: 7 (ref 5–15)
BUN: 6 mg/dL (ref 6–20)
CO2: 23 mmol/L (ref 22–32)
Calcium: 8.7 mg/dL — ABNORMAL LOW (ref 8.9–10.3)
Chloride: 106 mmol/L (ref 98–111)
Creatinine, Ser: 0.83 mg/dL (ref 0.61–1.24)
GFR, Estimated: >60 mL/min (ref >60)
Glucose, Bld: 105 mg/dL — ABNORMAL HIGH (ref 70–99)
Potassium: 3.9 mmol/L (ref 3.5–5.1)
Sodium: 136 mmol/L (ref 135–145)
Total Bilirubin: 3.6 mg/dL — ABNORMAL HIGH (ref <1.2)
Total Protein: 6.8 g/dL (ref 6.5–8.1)

## 2023-11-13 LAB — CBC WITH DIFFERENTIAL/PLATELET
Abs Immature Granulocytes: 0.01 10*3/uL (ref 0.00–0.07)
Basophils Absolute: 0 10*3/uL (ref 0.0–0.1)
Basophils Relative: 1 %
Eosinophils Absolute: 0.3 10*3/uL (ref 0.0–0.5)
Eosinophils Relative: 8 %
HCT: 31.7 % — ABNORMAL LOW (ref 39.0–52.0)
Hemoglobin: 10.8 g/dL — ABNORMAL LOW (ref 13.0–17.0)
Immature Granulocytes: 0 %
Lymphocytes Relative: 18 %
Lymphs Abs: 0.8 10*3/uL (ref 0.7–4.0)
MCH: 34 pg (ref 26.0–34.0)
MCHC: 34.1 g/dL (ref 30.0–36.0)
MCV: 99.7 fL (ref 80.0–100.0)
Monocytes Absolute: 0.4 10*3/uL (ref 0.1–1.0)
Monocytes Relative: 10 %
Neutro Abs: 2.7 10*3/uL (ref 1.7–7.7)
Neutrophils Relative %: 63 %
Platelets: 181 10*3/uL (ref 150–400)
RBC: 3.18 MIL/uL — ABNORMAL LOW (ref 4.22–5.81)
RDW: 14.9 % (ref 11.5–15.5)
WBC: 4.3 10*3/uL (ref 4.0–10.5)
nRBC: 0 % (ref 0.0–0.2)

## 2023-11-13 NOTE — Progress Notes (Signed)
Physical Therapy Session Note  Patient Details  Name: Brian [name] MRN: [id number] Date of Birth: Jul 25, 1961  Today's Date: 11/13/2023 PT Individual Time: 8413-2440 PT Individual Time Calculation (min): 45 min   Short Term Goals: Week 1:  PT Short Term Goal 1 (Week 1): Pt will complete sit<>stand with mod assist to LRAD PT Short Term Goal 2 (Week 1): Pt will complete stand pivot transfer with LRAD and mod assist PT Short Term Goal 3 (Week 1): Pt will ambulate with LRAD 10' with mod assist and +2 for WC follow  Skilled Therapeutic Interventions/Progress Updates:    Patient seen for lymphedema measurements and wrapping for lymphedema control.  Educated pt and washed legs, measured as below and applied Molelast to toes, stockinette, Artiflex and comprilan 6cm, 10cm and 12cm to lower legs. Pt tolerated well and verbalized understanding of method of LE movement to push into wraps and move fluid.       L    R   Great Toe  12cm    11.3 cm  MTP's   29.5 cm   31 cm  10 cm up from MTP 34.8cm   35 cm  At malleoli  37.2 cm   36.5 cm   10 cm up from mall 32.8 cm   36.1 cm  10 cm up   40.4 cm   44.9 cm   10 cm up  45.8 cm   48.0 cm PT to follow up for tolerance and re -wrap as needed and follow measurements until stable.   Therapy Documentation Precautions:  Precautions Precautions: Fall Precaution Comments: incontinence Restrictions Weight Bearing Restrictions: No  Pain: Pain Assessment Pain Scale: 0-10 Pain Score: 0-No pain    Therapy/Group: Individual Therapy  Elray Mcgregor 11/13/2023, 6:24 PM  Sheran Lawless, PT Acute Rehabilitation Services Office:[phone] 11/13/2023

## 2023-11-13 NOTE — Progress Notes (Signed)
Occupational Therapy Session Note  Patient Details  Name: SHI BAYERL MRN: [id number] Date of Birth: April 14, 1961  Today's Date: 11/13/2023 OT Individual Time: 8657-8469 OT Individual Time Calculation (min): 42 min    Short Term Goals: Week 1:  OT Short Term Goal 1 (Week 1): Patient will complete toilet transfer with max A of 1 OT Short Term Goal 2 (Week 1): Pt will complete 1 step of toileting OT Short Term Goal 3 (Week 1): Patient will stand with max A of 1  Skilled Therapeutic Interventions/Progress Updates:    Pt greeted seated in wc and agreeable to OT treatment session. PT brought to the sink and completed UB bathing/dressing and grooming tasks with set-up A. Pt brought to therapy gym in wc. Pt completed stand-pivot with RW and min A to NuStep. Pt completed 10 minutes on NuStep on level 3 for generalized strength/endurance. Pt returned to room and left seated in wc wih alarm on, call bell in reach, and needs met.   Therapy Documentation Precautions:  Precautions Precautions: Fall Precaution Comments: incontinence Restrictions Weight Bearing Restrictions: No Pain: Denies pain   Therapy/Group: Individual Therapy  Mal Amabile 11/13/2023, 10:04 AM

## 2023-11-13 NOTE — Progress Notes (Signed)
Physical Therapy Session Note  Patient Details  Name: Brian [name] MRN: [id number] Date of Birth: Jul 18, 1961  Today's Date: 11/13/2023 PT Individual Time: 1191-4782 + 1420-1530 PT Individual Time Calculation (min): 40 min  + 70 min  Short Term Goals: Week 1:  PT Short Term Goal 1 (Week 1): Pt will complete sit<>stand with mod assist to LRAD PT Short Term Goal 2 (Week 1): Pt will complete stand pivot transfer with LRAD and mod assist PT Short Term Goal 3 (Week 1): Pt will ambulate with LRAD 10' with mod assist and +2 for WC follow  Skilled Therapeutic Interventions/Progress Updates:     SESSION 1: Pt presents in room seated EOB, agreeable to PT. Pt reporting soreness in bilateral knees, premedicated. Session focused on transfer training and therapeutic exercise to promote BLE strengthening needed for functional transfers and ambulation.  Therapist dons socks and pants with max assist for time management. Pt completes sit<>stand from elevated bed with min assist to RW, max assist to pull pants over hips. Pt completes stand step transfer with RW mod assist bed>wc, demonstrates bilateral knee instability and buckling during transfer however no LOB able to correct with mod assist. Pt comes to sitting in [facility] with cues for positioning and LUE hand placement on WC for eccentric lowering.  Pt transported via WC from room to main gym dependently for time management. Pt completes therapeutic exercise to promote BLE strengthening including: - standing marches 2x10 BLE - standing mini squats 2x10 - standing heel raises x10 - x5 sit<>stands with heavy mod assist to RW, cues for LUE hand placement  Pt ambulates 25' with RW min/mod assist and 2nd person WC follow, pt demonstrate NBOS, short step length and step height, pt demonstrates bilateral knee buckle x2 instances without LOB.  Pt returns to room and remains seated in [facility] with all needs within reach, cal light in place and chair alarm donned and  activated at end of session.   SESSION 2: Pt presents in room seated in [facility] and agreeable to PT. Pt denies pain. Session focused on therapeutic exercise for BLE strengthening and activity tolerance, transfer training, and gait training for upright tolerance and multdirectional stepping.  Pt transported from room to ortho gym. Pt completes stand pivot transfer wc to nustep  with RW mod assist. Pt completes continuous training on nustep BUE/BLE x10 min total workload 4, maintains 80 SPM with total 742 steps at end of exercise.  Pt completes stand pivot transfer back to [facility] mod assist with RW, cues for eccentric lowering with pt demonstrating uncontrolled descent, some bilateral knee buckling during transfer no LOB.  Pt transported to main gym and positioned in //bars. Pt completes standing therex, therapist seated in front of pt to block stance limb, to promote BLE strengthening and muscle fiber recruitment needed for functional transfers and gait including: - alternating marches x10 BLE - mini squats x10  - hip abduction x5 BLE (significant buckling on RLE stance phase requires mod assist to block knee - forward step and back x5 BLE (buckling RLE, therapist provide mod assist for blocking R knee) -standing glute set 2x10 (tactile/visual cues for desired muscular activation) *Therapist provides min/mod assist for pull to stand in //bars  Pt completes WC mobility 125' with BLEs and mod assist promote BLE strengthening and muscular endurance needed for functional transfers and ambulation.  Pt provided with seated rest breaks between all exercises to promote energy conservation and quality with tasks.  Pt returns to room and remains  seated in [facility] with all needs within reach, cal light in place and chair alarm donned and activated at end of session.    Therapy Documentation Precautions:  Precautions Precautions: Fall Precaution Comments: incontinence Restrictions Weight Bearing Restrictions:  No    Therapy/Group: Individual Therapy  Edwin Cap PT, DPT 11/13/2023, 12:43 PM

## 2023-11-13 NOTE — Progress Notes (Signed)
PROGRESS NOTE   Subjective/Complaints: No acute events overnight.  He reports legs are doing better with wrapping and moisturizing cream.  Continues to have bowel and bladder incontinence more often at night.     ROS: + bowel and bladder incontinence -ongoing, generally at night.  Bilateral lower extremity swelling-mildly improved with wraps. denies fevers, chills, N/V, abdominal pain, constipation, diarrhea, SOB, cough, chest pain, headache, new weakness or paraesthesias.    Objective:   DG Abd 1 View  Result Date: 11/12/2023 CLINICAL DATA:  [id number] Incontinence of bowel [id number] EXAM: ABDOMEN - 1 VIEW COMPARISON:  October 31, 2023 FINDINGS: Evaluation is limited by underpenetration. No dilated loops of bowel are seen. Air is visualized in the rectum. Vascular calcifications. Bibasilar platelike opacities, favored atelectasis. IMPRESSION: Nonobstructive bowel gas pattern. Electronically Signed   By: Meda Klinefelter M.D.   On: 11/12/2023 14:02   Recent Labs    11/13/23 0454  WBC 4.3  HGB 10.8*  HCT 31.7*  PLT 181   Recent Labs    11/13/23 0454  NA 136  K 3.9  CL 106  CO2 23  GLUCOSE 105*  BUN 6  CREATININE 0.83  CALCIUM 8.7*    Intake/Output Summary (Last 24 hours) at 11/13/2023 8119 Last data filed at 11/12/2023 1422 Gross per 24 hour  Intake 237 ml  Output 225 ml  Net 12 ml     Pressure Injury 11/08/23 Heel Right Deep Tissue Pressure Injury - Purple or maroon localized area of discolored intact skin or blood-filled blister due to damage of underlying soft tissue from pressure and/or shear. (Active)  11/08/23 1600  Location: Heel  Location Orientation: Right  Staging: Deep Tissue Pressure Injury - Purple or maroon localized area of discolored intact skin or blood-filled blister due to damage of underlying soft tissue from pressure and/or shear.  Wound Description (Comments):   Present on Admission: Yes     Physical Exam: Vital Signs Blood pressure 137/72, pulse 73, temperature 98.8 F (37.1 C), temperature source Oral, resp. rate 18, height 5\' 11"  (1.803 m), weight 120 kg, SpO2 93%. Constitutional: No apparent distress. Appropriate appearance for age.  Morbidly obese.  Laying in bed. HENT: PERRLA.  Atraumatic, normocephalic. + Goiter Cardiovascular: RRR, no murmurs/rub/gallops. 2+ bilateral pitting and lymphedema from the knee down,-wrapped bilaterally and Ace wraps Respiratory: CTAB. No rales, rhonchi, or wheezing. On RA.  Abdomen: + bowel sounds, normoactive.  Nondistended, no  tenderness.  Skin:  + Bilateral chronic lower extremity skin thickening and discolorations; no open areas.  Flaking, dry skin over bilateral shins and feet, between toes -looks much better after cream application and wrapping  + Bilateral buttocks and coccygeal MASD-not examined + Right heel deep tissue injury, unstageable, with underlying bruise-appears dry, stable today            MSK:      No apparent deformity.   Neurologic exam:  Cognition: [name] to person, place, time + Poor insight, memory deficit-ongoing + Cognitive slowing-much improved  Strength: Antigravity and against resistance in bilateral upper extremities, 4 out of 5.  Bilateral lower extremities, 3 out of 5 hip flexion, otherwise 4- out of 5 bilaterally  Insight: Good  insight into current condition.  Mood: Pleasant affect, appropriate mood.  Sensation: Equal and intact in BL UE and Les.  Coordination: Mild bilateral upper extremity ataxia on finger-to-nose  Assessment/Plan: 1. Functional deficits which require 3+ hours per day of interdisciplinary therapy in a comprehensive inpatient rehab setting. Physiatrist is providing close team supervision and 24 hour management of active medical problems listed below. Physiatrist and rehab team continue to assess barriers to discharge/monitor patient progress toward functional and  medical goals  Care Tool:  Bathing    Body parts bathed by patient: Right arm, Left arm, Chest, Abdomen, Face   Body parts bathed by helper: Front perineal area, Buttocks, Right upper leg, Left upper leg, Right lower leg, Left lower leg     Bathing assist Assist Level: Maximal Assistance - Patient 24 - 49%     Upper Body Dressing/Undressing Upper body dressing   What is the patient wearing?: Pull over shirt    Upper body assist Assist Level: Minimal Assistance - Patient > 75%    Lower Body Dressing/Undressing Lower body dressing      What is the patient wearing?: Pants, Incontinence brief     Lower body assist Assist for lower body dressing: Total Assistance - Patient < 25%     Toileting Toileting    Toileting assist Assist for toileting: Dependent - Patient 0%     Transfers Chair/bed transfer  Transfers assist  Chair/bed transfer activity did not occur: Safety/medical concerns (Unsafet to get out of bed.)  Chair/bed transfer assist level: Dependent - mechanical lift     Locomotion Ambulation   Ambulation assist   Ambulation activity did not occur: Safety/medical concerns          Walk 10 feet activity   Assist  Walk 10 feet activity did not occur: Safety/medical concerns        Walk 50 feet activity   Assist Walk 50 feet with 2 turns activity did not occur: Safety/medical concerns         Walk 150 feet activity   Assist Walk 150 feet activity did not occur: Safety/medical concerns         Walk 10 feet on uneven surface  activity   Assist Walk 10 feet on uneven surfaces activity did not occur: Safety/medical concerns         Wheelchair     Assist Is the patient using a wheelchair?: Yes Type of Wheelchair: Manual    Wheelchair assist level: Dependent - Patient 0% Max wheelchair distance: 150    Wheelchair 50 feet with 2 turns activity    Assist        Assist Level: Dependent - Patient 0%   Wheelchair  150 feet activity     Assist      Assist Level: Dependent - Patient 0%   Blood pressure 137/72, pulse 73, temperature 98.8 F (37.1 C), temperature source Oral, resp. rate 18, height 5\' 11"  (1.803 m), weight 120 kg, SpO2 93%.  Medical Problem List and Plan: 1. Functional deficits secondary to acute encephalopathy, hepatic and possible metabolic component             -patient may shower             -ELOS/Goals: 10-14 days S             - Stable to continue CIR  -Team conference tomorrow  2.  Antithrombotics: -DVT/anticoagulation:  Pharmaceutical: Lovenox             -  antiplatelet therapy: N/A 3.Left knee pain: XR ordered, voltaren gel ordered   - 11/21: No complaints today. 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  May benefit from hinged knee brace when weightbearing, or injection if medically stable.  Monitor today for therapy assessments.  - 11/22: With PT, denied pain during session yesterday with ambulation and transfers. No adjustments at this time.   4. Mood/Behavior/Sleep: Provide emotional support             -antipsychotic agents: N/A 5. Neuropsych/cognition: This patient is capable of making decisions on his own behalf. 6. Skin/Wound Care: Routine skin checks..Excoriation of buttocks with wound care as directed -Low-air-loss mattress, Mepilex dressing to bilateral heels to prevent breakdown--add [employer] RLE 11/22-not yet in room 11-24. -Goodhart's Butt cream to prevent worsening MASD -Hydrocyn cream to bilateral legs for flaking, dry skin -11-23: Nursing order to increase cream to twice daily, over legs and feet, and to apply Ace wrapping from feet to mid shin to prevent worsening dependent edema. 11-24: Much improved appearance of bilateral lower extremities, continue regimen of wrapping and hydrating cream.   7. Fluids/Electrolytes/Nutrition: Routine in and outs with follow-up chemistries  -11-21: Sodium 133, repeat in  a.m. for trend.  Otherwise labs stable.  11-22: Improved 134 today; next labs on Monday, monitor for cognitive changes  8.  Colitis versus ileitis-acute appendicitis ruled out.  Follow-up outpatient general surgery  11-24: Remains with frequent loose bowel movements overnight.  Lactulose held/as needed.  Will need to continue targeted bowel movements at least 2 to 3/day, however did add Imodium for excessive bowel movements.  No current concern for infection, vital stable and no white count.  9.  Hypertension.  Blood pressure remains a bit soft remains on ProAmatine 10 mg 3 times daily as well as low-dose Coreg 12.5 mg twice daily.   - 11/21: Diastolic hypotension as below; resume midodrine 5 mg 2 times daily and wean as tolerated -stable 11-23  11/25 BP well-controlled     11/13/2023    3:49 AM 11/12/2023    7:14 PM 11/12/2023    1:05 PM  Vitals with BMI  Systolic 137 127 664  Diastolic 72 67 60  Pulse 73 73 75    10.  Chronic lymphedema.  Follow-up outpatient.  -PT order for lymphedema wrapping and assistance and education, management 11-22 -11-23: Per PT, only 1 PT in the hospital provide lymphedema management, looking into getting them in the room to assist in education this coming week -11/24 nursing providing Ace wrapping to bilateral lower extremities, does appear to be helping. 11/25 reports gradually improving, continue current regimen  11.History of alcohol use.  Counseling 12.  Tobacco use.  Counseling 13.  Obesity.  BMI 37.08.  Dietary follow-up 14.  Prediabetes.  Hemoglobin A1c 5.8. d/c ISS and start metformin ER release with dinner, creatinine reviewed and is stable -Blood sugars well-controlled, DC BG checks No results for input(s): "GLUCAP" in the last 72 hours.  11/25 glucose stable on BMP  15.  BPH/bladder incontinence/bowel incontinence.  Flomax 0.4 mg daily. BP reviewed and is stable   -11-23: DC Flomax, continue timed toileting and PVRs  11-24: PVRs remain  low, remains continent during the day and incontinent at night.  Continue timed toileting.   16. Hyperammonemia.  Hepatic steatosis seen on intake CT.  - 11/21: Seems more encephalopathic today, last ammonia was increased from 40s to 60s, add on to a.m. labs and resume lactulose 7.5 mg twice  daily if worsening  11-22: Ammonia within normal limits.  Continues having 4-5 stools per day.  Change lactulose to 10 mg twice daily as needed to target at least 3 stools per day.  Cognition does seem to be improving.  17. Anemia.  hemoglobin decreased on intake from 12,6 to 10.7.  FOBT, H&H in AM. -11-22: Hemoglobin improved to 11.4, FOBT pending but no obvious blood in stool.  11/25 FOBT negative yesterday, hemoglobin 10.8 today-stable since 11/21  LOS: 5 days A FACE TO FACE EVALUATION WAS PERFORMED  Fanny Dance 11/13/2023, 8:06 AM

## 2023-11-13 NOTE — Progress Notes (Signed)
Speech Language Pathology Daily Session Note  Patient Details  Name: Brian [name] MRN: [id number] Date of Birth: March 30, 1961  Today's Date: 11/13/2023 SLP Individual Time: 1300-1345 SLP Individual Time Calculation (min): 45 min  Short Term Goals: Week 1: SLP Short Term Goal 1 (Week 1): Patient will orient to date, time, situation, and person given external aids and min verbal cues. SLP Short Term Goal 2 (Week 1): Patient will utilize external and internal memory aids to recall daily information with mod verbal cues. SLP Short Term Goal 3 (Week 1): Patient will state two cognitive and two physical impairments demonstrating intellectual awareness given mod assist. SLP Short Term Goal 4 (Week 1): Patient will solve basic environmental problems in 4/5 opportunities given mod assist.  Skilled Therapeutic Interventions: SLP conducted skilled therapy session targeting cognitive retraining goals. Patient greeted upright in chair and reporting that his cognition is almost back to normal per his observations. SLP guided patient through basic-mildly complex cognitive task with patient benefiting from mod fading to min assist to complete accurately. Patient demonstrated reduced working memory skills during problem solving tasks. Patient oriented x4 with modified independence and utilizes external aids appropriately. Patient was left in chair with call bell in reach and chair alarm set. SLP will continue to target goals per plan of care.       Pain Pain Assessment Pain Scale: 0-10 Pain Score: 0-No pain  Therapy/Group: Individual Therapy  Jeannie Done, M.A., CCC-SLP  [name] 11/13/2023, 3:47 PM

## 2023-11-14 NOTE — Progress Notes (Signed)
Patient placed on overnight pulse oximetry per order.

## 2023-11-14 NOTE — Progress Notes (Signed)
Speech Language Pathology Daily Session Note  Patient Details  Name: Brian [name] MRN: [id number] Date of Birth: November 20, 1961  Today's Date: 11/14/2023 SLP Individual Time: 4098-1191 SLP Individual Time Calculation (min): 55 min  Short Term Goals: Week 1: SLP Short Term Goal 1 (Week 1): Patient will orient to date, time, situation, and person given external aids and min verbal cues. SLP Short Term Goal 2 (Week 1): Patient will utilize external and internal memory aids to recall daily information with mod verbal cues. SLP Short Term Goal 3 (Week 1): Patient will state two cognitive and two physical impairments demonstrating intellectual awareness given mod assist. SLP Short Term Goal 4 (Week 1): Patient will solve basic environmental problems in 4/5 opportunities given mod assist.  Skilled Therapeutic Interventions: SLP conducted skilled therapy session targeting cognitive retraining goals. Patient oriented to date and day of the week with supervision using external aids. During basic sequencing tasks, patient required supervision-min assist for 4-step sequencing and up to mod assist for 6-step sequencing. Patient required min-mod assist for basic deductive reasoning. Patient continues to demonstrate reduced intellectual awareness requiring min-modA to state cognitive deficits. Patient was left in lowered bed with call bell in reach and bed alarm set. SLP will continue to target goals per plan of care.       Pain Pain Assessment Pain Scale: 0-10 Pain Score: 0-No pain  Therapy/Group: Individual Therapy  Jeannie Done, M.A., CCC-SLP  [name] 11/14/2023, 9:27 AM

## 2023-11-14 NOTE — Progress Notes (Signed)
Patient ID: Brian [name], male   DOB: 04/05/1961, 62 y.o.   MRN: [id number]  SW met with pt in room to provide updates from team conference, and d/c date 12/10. Pt aware SW will follow-up with his wife.   1521- SW left message for pt wife [name] to provide updates from team conference, inform on d/c date, and request return phone call to discuss discharge plan in regards to support at home, and family education.   Cecile Sheerer, MSW, LCSW Office: [phone] Cell: [phone] Fax: [phone]

## 2023-11-14 NOTE — Patient Care Conference (Signed)
Inpatient RehabilitationTeam Conference and Plan of Care Update Date: 11/14/2023   Time: 10:43 AM    Patient Name: Brian [name]      Medical Record Number: [id number]  Date of Birth: [birth year] Sex: Male         Room/Bed: 4M08C/4M08C-01 Payor Info: Payor: MEDICAID PENDING / Plan: MEDICAID PENDING / Product Type: *No Product type* /    Admit Date/Time:  11/08/2023  3:43 PM  Primary Diagnosis:  Acute metabolic encephalopathy  Hospital Problems: Principal Problem:   Acute metabolic encephalopathy    Expected Discharge Date: Expected Discharge Date: 11/28/23  Team Members Present: Physician leading conference: Dr. Elijah Birk Social Worker Present: Cecile Sheerer, LCSWA Nurse Present: Vedia Pereyra, RN PT Present: Malachi Pro, PT OT Present: Roney Mans, OT SLP Present: Jeannie Done, SLP PPS Coordinator present : Fae Pippin, SLP     Current Status/Progress Goal Weekly Team Focus  Bowel/Bladder   Patient is incontinet if bladder and bowel   Restore contence of blader and bowel   Continue bladderprogram  and reinforceptient ,and staff assist,    Swallow/Nutrition/ Hydration               ADL's   Max A LB BADLs, Min A UB BADLs, Min/mod A sit<>stands and stand-pivots w/ RW   Supervision/CGA for BADL tasks and transfers   Self-care retraining, sit<>stands, transfers, activity tolerance    Mobility   modA transfers, gait modA 25' with RW +2 for WC follow   CGA ambulatory  barriers: strength/endurance; focus on strengthening/endurance, gait training, transfer training    Communication                Safety/Cognition/ Behavioral Observations  minA for basic problem solving, mod for mildly complex, modA for working memory, Barrister[employer] clerk for stating deficits   minA   working memory, intellectual awareness    Pain   denies pain state he has a high tolen   Remain pain free   Assess pain QS/PRN with follow up    Skin   Has Hx of Lympyedema to BLE's, MASD  to buttocks and coccyx  areas, pressure injuries right heel   Continue current treatment plan,patient is on an         Discharge Planning:  Pt is uninsured. Pt to d/c to home with support from his wife, and their son who can help PRN as he works 12hr shifts. SW will confirm there are no barriers to discharge.   Team Discussion: Acute metabolic encephalopathy. Wound care plan. Time toileting with incontinence. Denies pain. DTI to right heel. MASD to buttocks. Fissure to sacrum/coccyx.   Sodium levels improving. TED hose. Right Prevalon boot. Tolerating D3 diet. Daily weights. Working on self care retraining, activity tolerance.  Patient on target to meet rehab goals: yes, progressing towards goals with discharge date of 11/28/23  *See Care Plan and progress notes for long and short-term goals.   Revisions to Treatment Plan:  Wraps to bilateral lower extremity.  Air mattress. Gerhardt's butt cream.   Teaching Needs: Medications, safety, self care, gait/transfer training, skin/wound care, etc.   Current Barriers to Discharge: Decreased caregiver support, Incontinence, Wound care, and Weight  Possible Resolutions to Barriers: Family education Regain control of bladder and bowel Independent with wound care Adhere to diet/lifestyle modifications to improve overall health Order recommended DME     Medical Summary Current Status: Medically complicated by cognitive deficits, chronic alcohol use, lymphedema, lower extremity skin integrity/wounds, ongoing bowel and bladder incontinence, and hypotension  Barriers to Discharge: Behavior/Mood;Complicated Wound;Hypotension;Incontinence;Morbid Obesity;Medical stability;Self-care education   Possible Resolutions to [employer] Focus: Continue timed toileting to promote continence, titrate bowel regimen, education on lymphedema management for lower extremities, wound care, monitor weights and fluid status   Continued Need for Acute  Rehabilitation Level of Care: The patient requires daily medical management by a physician with specialized training in physical medicine and rehabilitation for the following reasons: Direction of a multidisciplinary physical rehabilitation program to maximize functional independence : Yes Medical management of patient stability for increased activity during participation in an intensive rehabilitation regime.: Yes Analysis of laboratory values and/or radiology reports with any subsequent need for medication adjustment and/or medical intervention. : Yes   I attest that I was present, lead the team conference, and concur with the assessment and plan of the team.   Jearld Adjutant 11/14/2023, 2:15 PM

## 2023-11-14 NOTE — Progress Notes (Addendum)
Physical Therapy Session Note  Patient Details  Name: Brian [name] MRN: [id number] Date of Birth: 12-31-1960  Today's Date: 11/14/2023 PT Individual Time: 0930-1000 and 1300-1400 PT Individual Time Calculation (min): 30 min and 60 min.  Short Term Goals: Week 1:  PT Short Term Goal 1 (Week 1): Pt will complete sit<>stand with mod assist to LRAD PT Short Term Goal 2 (Week 1): Pt will complete stand pivot transfer with LRAD and mod assist PT Short Term Goal 3 (Week 1): Pt will ambulate with LRAD 10' with mod assist and +2 for WC follow  Skilled Therapeutic Interventions/Progress Updates:   First session:  Pt presents semi-reclined in bed and agreeable to therapy.  Pt transfers supine to sit w/ CGA and bed features.  Pt elevated LES for threading of scrub pants over feet and then reached to pull up to thighs.  Total A for slipper socks.  Pt transfers sit to stand from elevated bed height w/ mod A.  Pt amb  across room to recliner chair w/ mod A/min A.  Pt performed LE there ex for increased strength and endurance.  Pt remained sitting in recliner w/ LES elevated , seat alarm on and all needs in reach.  Second session:  Pt presents sitting in recliner w/ LES elevated and agreeable to therapy.  Pt transfers sit to stand w/ mod A  and cues for sequencing.  Pt amb x 5' to w/c w/ min A and cues for increased foot clearance and speed.  Pt's knees wobbly but no buckling.  Pt wheeled to main gym.  Pt performed multiple sit to stand ransfers w/ mod to max A and cues for sequencing.  Pt performed standing marching w/ L knee buckling occasionally.  Pt requires seated rest break between sets 2/2 fatigue and increased RR, O2 at 97% on RA, HR 77.  Pt performed LAQ w/ 2.5# AW 3 x 10, although RLE fatigues quickly.  Pt performed abd/add 3 x 10 w/ A and AAROM, R > LLE.  Pt amb x 10' w/ RW and min A but increased to Mod A 2/2 knees buckling.  Pt requires cues for foot clearance and turns to mat table.  Pt required  mod A sit to stand from elevated mat and amb back to w/c.  Pt requires cues for safe approach to seated surface and to complete backing up.  Pt remained sitting in w/c w/ chair alarm on and all needs in reach.     Therapy Documentation Precautions:  Precautions Precautions: Fall Precaution Comments: incontinence Restrictions Weight Bearing Restrictions: No General:   Vital Signs:   Pain:8/10 knees Pain Assessment Pain Scale: 0-10 Pain Score: 0-No pain       Therapy/Group: Individual Therapy  Lucio Edward 11/14/2023, 10:05 AM

## 2023-11-14 NOTE — Progress Notes (Signed)
Occupational Therapy Session Note  Patient Details  Name: Brian [name] MRN: [id number] Date of Birth: 1961-05-16  Today's Date: 11/14/2023 OT Individual Time: 1430-1530 OT Individual Time Calculation (min): 60 min    Short Term Goals: Week 1:  OT Short Term Goal 1 (Week 1): Patient will complete toilet transfer with max A of 1 OT Short Term Goal 2 (Week 1): Pt will complete 1 step of toileting OT Short Term Goal 3 (Week 1): Patient will stand with max A of 1  Skilled Therapeutic Interventions/Progress Updates:     Pt received sitting up in wc dressed and ready for the day, politely declining need for BADLs this session. Pt presenting to be in good spirits receptive to skilled OT session reporting 0/10 pain- OT offering intermittent rest breaks, repositioning, and therapeutic support to optimize participation in therapy session. Transported Pt total A to therapy gym fro time management and energy conservation.   Pt completed stand pivot wc>NuStep using RW with heavy min A required to power up to standing position and verbal cues provided for technique and hand placement.   Pt completed U/LB endurance training on NuStep on level 4 setting to increase overall strength and endurance for improved participation in BADLs. Pt tolerated completing 15 minutes with short rest break provided at minute 10 and following activity.   Stand pivot NuStep>WC using RW min A +increased time with mod verbal cues for hand placement.   Engaged Pt in seated B UE exercises to increase strength for BADLs. Pt completed 2x12 reps of the following exercises with intermittent rest breaks provided between each exercises and between trials: -Bicep curls -Chest Press -[employer] (completed with body weight only d/t shoulder weakness bilaterally) -Shoulder flexion   Transported Pt back to room total A in wc for energy conservation. Pt was left resting in wc with call bell in reach, seatbelt alarm on, and all  needs met.    Therapy Documentation Precautions:  Precautions Precautions: Fall Precaution Comments: incontinence Restrictions Weight Bearing Restrictions: No   Therapy/Group: Individual Therapy  Clide Deutscher 11/14/2023, 2:56 PM

## 2023-11-14 NOTE — Progress Notes (Signed)
PROGRESS NOTE   Subjective/Complaints: No acute events overnight.  No acute complaints. Patient states he is eating and sleeping well.  Thinks bowel movements are slowing down, but still having significant incontinence of both bowel and bladder. States legs feel much better with lymphedema wraps.  Nursing concerned regarding monitoring of heel DTI with lymphedema wraps.  ROS: + bowel and bladder incontinence -ongoing, generally at night.   Bilateral lower extremity swelling-mildly improved with wraps. '  denies fevers, chills, N/V, abdominal pain, constipation, diarrhea, SOB, cough, chest pain, headache, new weakness or paraesthesias.    Objective:   DG Abd 1 View  Result Date: 11/12/2023 CLINICAL DATA:  [id number] Incontinence of bowel [id number] EXAM: ABDOMEN - 1 VIEW COMPARISON:  October 31, 2023 FINDINGS: Evaluation is limited by underpenetration. No dilated loops of bowel are seen. Air is visualized in the rectum. Vascular calcifications. Bibasilar platelike opacities, favored atelectasis. IMPRESSION: Nonobstructive bowel gas pattern. Electronically Signed   By: Meda Klinefelter M.D.   On: 11/12/2023 14:02   Recent Labs    11/13/23 0454  WBC 4.3  HGB 10.8*  HCT 31.7*  PLT 181   Recent Labs    11/13/23 0454  NA 136  K 3.9  CL 106  CO2 23  GLUCOSE 105*  BUN 6  CREATININE 0.83  CALCIUM 8.7*    Intake/Output Summary (Last 24 hours) at 11/14/2023 0738 Last data filed at 11/14/2023 8119 Gross per 24 hour  Intake 676 ml  Output 900 ml  Net -224 ml     Pressure Injury 11/08/23 Heel Right Deep Tissue Pressure Injury - Purple or maroon localized area of discolored intact skin or blood-filled blister due to damage of underlying soft tissue from pressure and/or shear. (Active)  11/08/23 1600  Location: Heel  Location Orientation: Right  Staging: Deep Tissue Pressure Injury - Purple or maroon localized area of discolored  intact skin or blood-filled blister due to damage of underlying soft tissue from pressure and/or shear.  Wound Description (Comments):   Present on Admission: Yes    Physical Exam: Vital Signs Blood pressure 125/68, pulse 83, temperature 99.6 F (37.6 C), temperature source Oral, resp. rate 16, height 5\' 11"  (1.803 m), weight 125 kg, SpO2 97%. Constitutional: No apparent distress. Appropriate appearance for age.  Morbidly obese.  Sitting up in bed. HENT: PERRLA.  Atraumatic, normocephalic. + Goiter-not examined today Cardiovascular: RRR, no murmurs/rub/gallops. 1+ bilateral pitting and lymphedema from the knee down,-wrapped bilaterally and Ace wraps Respiratory: CTAB. No rales, rhonchi, or wheezing. On RA.  Abdomen: + bowel sounds, normoactive.  Nondistended, no  tenderness.  Skin:  + Bilateral chronic lower extremity skin thickening and discolorations; obscured now by lymphedema wraps + Bilateral buttocks and coccygeal MASD-not examined + Right heel deep tissue injury, unstageable, with underlying bruise-obscured by lymphedema wraps            MSK:      No apparent deformity.   Neurologic exam:  Cognition: [name] to person, place, time, and event. + Poor insight, memory deficit-improving daily + Cognitive slowing-much improved  Strength: Antigravity and against resistance in bilateral upper extremities, 4 out of 5.  Bilateral lower extremities, now 4- out  of 5 bilaterally  Insight: Good insight into current condition.  Mood: Pleasant affect, appropriate mood.  Sensation: Equal and intact in BL UE and Les.  Coordination: Mild bilateral upper extremity ataxia on finger-to-nose-ongoing  Assessment/Plan: 1. Functional deficits which require 3+ hours per day of interdisciplinary therapy in a comprehensive inpatient rehab setting. Physiatrist is providing close team supervision and 24 hour management of active medical problems listed below. Physiatrist and rehab team continue  to assess barriers to discharge/monitor patient progress toward functional and medical goals  Care Tool:  Bathing    Body parts bathed by patient: Right arm, Left arm, Chest, Abdomen, Face   Body parts bathed by helper: Front perineal area, Buttocks, Right upper leg, Left upper leg, Right lower leg, Left lower leg     Bathing assist Assist Level: Maximal Assistance - Patient 24 - 49%     Upper Body Dressing/Undressing Upper body dressing   What is the patient wearing?: Pull over shirt    Upper body assist Assist Level: Minimal Assistance - Patient > 75%    Lower Body Dressing/Undressing Lower body dressing      What is the patient wearing?: Pants, Incontinence brief     Lower body assist Assist for lower body dressing: Total Assistance - Patient < 25%     Toileting Toileting    Toileting assist Assist for toileting: Dependent - Patient 0%     Transfers Chair/bed transfer  Transfers assist  Chair/bed transfer activity did not occur: Safety/medical concerns (Unsafet to get out of bed.)  Chair/bed transfer assist level: Moderate Assistance - Patient 50 - 74%     Locomotion Ambulation   Ambulation assist   Ambulation activity did not occur: Safety/medical concerns  Assist level: 2 helpers Assistive device: Walker-rolling Max distance: 25'   Walk 10 feet activity   Assist  Walk 10 feet activity did not occur: Safety/medical concerns  Assist level: 2 helpers Assistive device: Walker-rolling   Walk 50 feet activity   Assist Walk 50 feet with 2 turns activity did not occur: Safety/medical concerns         Walk 150 feet activity   Assist Walk 150 feet activity did not occur: Safety/medical concerns         Walk 10 feet on uneven surface  activity   Assist Walk 10 feet on uneven surfaces activity did not occur: Safety/medical concerns         Wheelchair     Assist Is the patient using a wheelchair?: Yes Type of Wheelchair:  Manual    Wheelchair assist level: Moderate Assistance - Patient 50 - 74% Max wheelchair distance: 45'    Wheelchair 50 feet with 2 turns activity    Assist        Assist Level: Moderate Assistance - Patient 50 - 74%   Wheelchair 150 feet activity     Assist      Assist Level: Maximal Assistance - Patient 25 - 49%   Blood pressure 125/68, pulse 83, temperature 99.6 F (37.6 C), temperature source Oral, resp. rate 16, height 5\' 11"  (1.803 m), weight 125 kg, SpO2 97%.  Medical Problem List and Plan: 1. Functional deficits secondary to acute encephalopathy, hepatic and possible metabolic component             -patient may shower             -ELOS/Goals: 10-14 days SPV - DC 11/28/23             -  Stable to continue CIR  -11/26: Wife not returning calls per SLP. Remains Max A for LBD, min-mod transfers, walking 35 feet with RW Mod A with +2 WC follow. Min A for basic problem solving, Mod A for memory and awareness.   2.  Antithrombotics: -DVT/anticoagulation:  Pharmaceutical: Lovenox             -antiplatelet therapy: N/A 3.Left knee pain: XR ordered, voltaren gel ordered   - 11/21: No complaints today. 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  May benefit from hinged knee brace when weightbearing, or injection if medically stable.  Monitor today for therapy assessments.  - 11/22: With PT, denied pain during session yesterday with ambulation and transfers. No adjustments at this time.   4. Mood/Behavior/Sleep: Provide emotional support             -antipsychotic agents: N/A  11-26: See below, applying overnight pulse ox to screen for OSA  5. Neuropsych/cognition: This patient is capable of making decisions on his own behalf. 6. Skin/Wound Care: Routine skin checks..Excoriation of buttocks with wound care as directed -Low-air-loss mattress, Mepilex dressing to bilateral heels to prevent breakdown--add Doree Fudge boot RLE  11/22 -Goodhart's Butt cream to prevent worsening MASD -Hydrocyn cream to bilateral legs for flaking, dry skin -11-23: Nursing order to increase cream to twice daily, over legs and feet, and to apply Ace wrapping from feet to mid shin to prevent worsening dependent edema. 11-24: Much improved appearance of bilateral lower extremities, continue regimen of wrapping and hydrating cream. 11-26: Lymphedema wraps now on.  Concern regarding ongoing monitoring of right heel DTI with these wraps; will need to look under every few days to ensure ongoing healing.   7. Fluids/Electrolytes/Nutrition: Routine in and outs with follow-up chemistries  -11-21: Sodium 133, repeat in a.m. for trend.  Otherwise labs stable.  11-22: Improved 134 today; next labs on Monday, monitor for cognitive changes  8.  Colitis versus ileitis-acute appendicitis ruled out.  Follow-up outpatient general surgery  11-24: Remains with frequent loose bowel movements overnight.  Lactulose held/as needed.  Will need to continue targeted bowel movements at least 2 to 3/day, however did add Imodium for excessive bowel movements.  No current concern for infection, vital stable and no white count.  11-25/26: Unchanged, ongoing.  Questional contribution of cognitive deficits.  Bowel movements do seem to be slowing down.  9.  Hypertension.  Blood pressure remains a bit soft remains on ProAmatine 10 mg 3 times daily as well as low-dose Coreg 12.5 mg twice daily.   - 11/21: Diastolic hypotension as below; resume midodrine 5 mg 2 times daily and wean as tolerated -stable 11-23  11/25-26 BP well-controlled     11/14/2023    4:48 AM 11/14/2023    3:47 AM 11/13/2023    8:12 PM  Vitals with BMI  Weight 275 lbs 9 oz    BMI 38.45    Systolic  125 141  Diastolic  68 72  Pulse  83 77    10.  Chronic lymphedema.  Follow-up outpatient.  -PT order for lymphedema wrapping and assistance and education, management 11-22 -11-23: Per PT, only 1 PT  in the hospital provide lymphedema management, looking into getting them in the room to assist in education this coming week -11/24 nursing providing Ace wrapping to bilateral lower extremities, does appear to be helping. 11/25 reports gradually improving, continue current regimen 11-26: PT Elray Mcgregor applied lymphedema wraps and educated patient on care 11-25;  very much appreciate her help in this!  Patient comfortable, continue current management; PT to follow-up for tolerance and rewrap as needed and follow measurements until stable.  11.History of alcohol use.  Counseling 12.  Tobacco use.  Counseling 13.  Obesity.  BMI 37.08.  Dietary follow-up 14.  Prediabetes.  Hemoglobin A1c 5.8. d/c ISS and start metformin ER release with dinner, creatinine reviewed and is stable -Blood sugars well-controlled, DC BG checks  11/25 glucose stable on BMP  15.  BPH/bladder incontinence/bowel incontinence.  Flomax 0.4 mg daily. BP reviewed and is stable   -11-23: DC Flomax, continue timed toileting and PVRs  11-24: PVRs remain low, remains continent during the day and incontinent at night.  Continue timed toileting.  11-26: Uncertain what barriers to continence are at this time, patient states that he sleeps through episodes of incontinence although he does feel the urge to go and knows to call the nurse for assistance.  Labs stable as above.  Will apply overnight pulse ox to assess for OSA/disordered sleep.   16. Hyperammonemia.  Hepatic steatosis seen on intake CT.  - 11/21: Seems more encephalopathic today, last ammonia was increased from 40s to 60s, add on to a.m. labs and resume lactulose 7.5 mg twice daily if worsening  11-22: Ammonia within normal limits.  Continues having 4-5 stools per day.  Change lactulose to 10 mg twice daily as needed to target at least 3 stools per day.  Cognition does seem to be improving.  17. Anemia.  hemoglobin decreased on intake from 12,6 to 10.7.  FOBT, H&H in  AM. -11-22: Hemoglobin improved to 11.4, FOBT pending but no obvious blood in stool.  11/25 FOBT negative yesterday, hemoglobin 10.8 today-stable since 11/21  LOS: 6 days A FACE TO FACE EVALUATION WAS PERFORMED  Angelina Sheriff 11/14/2023, 7:38 AM

## 2023-11-15 ENCOUNTER — Encounter (HOSPITAL_COMMUNITY): Payer: Self-pay | Admitting: Physical Medicine and Rehabilitation

## 2023-11-15 ENCOUNTER — Other Ambulatory Visit: Payer: Self-pay

## 2023-11-15 NOTE — Progress Notes (Signed)
Patient ID: Brian [name], male   DOB: 20-May-1961, 62 y.o.   MRN: [id number]  SW received return phone call from pt wife Cala Bradford. SW provided updates from team conference, and d/c date. She reports there will be limited supports during the day since their son works. States she is not able to physically lift him. Reports there is a RW at home. SW discussed there will be family edu closer towards discharge to confirm they can manage his care needs at home.   Cecile Sheerer, MSW, LCSW Office: [phone] Cell: [phone] Fax: [phone]

## 2023-11-15 NOTE — Progress Notes (Signed)
Occupational Therapy Session Note  Patient Details  Name: Brian [name] MRN: [id number] Date of Birth: [birth year]  Today's Date: 11/15/2023 OT Individual Time: 4034-7425 OT Individual Time Calculation (min): 72 min    Short Term Goals: Week 1:  OT Short Term Goal 1 (Week 1): Patient will complete toilet transfer with max A of 1 OT Short Term Goal 2 (Week 1): Pt will complete 1 step of toileting OT Short Term Goal 3 (Week 1): Patient will stand with max A of 1  Skilled Therapeutic Interventions/Progress Updates:     Pt received sitting up in wc dressed and ready for the day upon OT arrival with all BADL needs met, Pt declining BR and toileting during session. Pt presenting to be in good spirits receptive to skilled OT session reporting 0/10 pain- OT offering intermittent rest breaks, repositioning, and therapeutic support to optimize participation in therapy session. Focus this session dynamic balance, B UE strengthening, and activity tolerance.   Pt completed stand pivot wc>EOM using RW with min A with improved weight shifting and RW management noted this trial.   Engaged Pt in seated beach ball volley sitting EOM to increased B UE strength, VMC, and activity tolerance for BADLs with pt instructed to utilize 4# weighted dowel to hit beach ball back and forth with therapist. Pt able to complete 1 min x3 trials with short rest breaks provided between trials.   Engaged Pt in completing B UE exercises using 4# weighted dowel sitting EOM. Pt completed 2x10 reps of bicep curls, chest press, and shoulder flexion with OT providing verbal cues for muscle engagement and technique.   Session then progressed to Pt completing dynamic standing balance activity with anterior and posterior reaching incorporated into activity to simulate skills required for BADLs. Pt able to maintain standing balance ~1 minute x2 trials during activity with single UE supported on RW with CGA provided for balance. Between  trials, worked on sit<>stands with emphasis on hand placement and anterior weight shifting. Pt completed stand pivot EOB>wc using RW with min A provided for balance and RW management. Transported Pt back to room total A in wc for energy conservation.   Pt with incontinent BM in brief at end of session. Utilized stedy for transport to BR to allow for increased opportunities to work on sit<>stands and standing balance. Posterior peri-care completed in standing max A for energy conservation. When seated on stedy, Pt able to perform anterior peri-care with set-up assist. Clothing management completed in standing with assistance required to fully adjust pants over bottom with Pt alternating hands to pull up pants.   Transported Pt back to room in stedy. Sit>stand holing stedy grab bars CGA. EOB>supine mod A to bring B LEs into bed. Pt was left resting in bed with call bell in reach and all needs met.    Therapy Documentation Precautions:  Precautions Precautions: Fall Precaution Comments: incontinence Restrictions Weight Bearing Restrictions: No   Therapy/Group: Individual Therapy  Clide Deutscher 11/15/2023, 8:04 AM

## 2023-11-15 NOTE — Progress Notes (Signed)
Physical Therapy Session Note  Patient Details  Name: Brian [name] MRN: [id number] Date of Birth: 07-26-1961  Today's Date: 11/15/2023 PT Individual Time: 1720-1830 PT Individual Time Calculation (min): 70 min   Short Term Goals: Week 1:  PT Short Term Goal 1 (Week 1): Pt will complete sit<>stand with mod assist to LRAD PT Short Term Goal 2 (Week 1): Pt will complete stand pivot transfer with LRAD and mod assist PT Short Term Goal 3 (Week 1): Pt will ambulate with LRAD 10' with mod assist and +2 for WC follow  Skilled Therapeutic Interventions/Progress Updates:    Seen for lymphedema management removed, cleansed, measured and reapplied wraps to include molelast, stockinette, Artiflex, Comprilan 6cm, 10cm & 12cm wraps for lower legs.  Measurements as below:                  L                                              R               Great Toe                    10.5 cm                                    9.7 cm             MTP's                          26.5 cm                                   28.9 cm             10 cm up from MTP    30.5 cm                                    32.7 cm             At malleoli                   30.0 cm                                   29.2 cm                        10 cm up from mall     29.5 cm                                   31.0 cm             10 cm up                     46.5 cm  40.0 cm                        10 cm up                     42.5 cm                                   45.1 cm  Note improved measurements.  Some errythema noted on bilateral shins and noted area of tissue injury under R heel though RN reports present prior to wrapping and she took measurements.  Patient denies pain and notes continuing to improve his mobility.  Will follow up for further management during rehab stay.    Therapy Documentation Precautions:  Precautions Precautions: Fall Precaution Comments:  incontinence Restrictions Weight Bearing Restrictions: No      Therapy/Group: Individual Therapy  Elray Mcgregor 11/15/2023, 6:46 PM  Sheran Lawless, PT Acute Rehabilitation Services [phone] 11/15/2023

## 2023-11-15 NOTE — Progress Notes (Signed)
Physical Therapy Session Note  Patient Details  Name: Brian [name] MRN: [id number] Date of Birth: 1961-08-13  Today's Date: 11/15/2023 PT Individual Time: 1030-1115 and 1500-1530 PT Individual Time Calculation (min): 45 min and 30 min  Short Term Goals: Week 1:  PT Short Term Goal 1 (Week 1): Pt will complete sit<>stand with mod assist to LRAD PT Short Term Goal 2 (Week 1): Pt will complete stand pivot transfer with LRAD and mod assist PT Short Term Goal 3 (Week 1): Pt will ambulate with LRAD 10' with mod assist and +2 for WC follow  Skilled Therapeutic Interventions/Progress Updates:   First session:  Pt presents sitting in w/c and agreeable to therapy.  Pt pleased w/ higher w/c.  Pt wheeled to small gym for time conservation.  Pt transferred sit to stand w/ CGA/min A, although occasionally requires 1+ attempts to complete.  Pt amb x 8' to Nu-step w/ min A, no buckling although cues given for safe turns to avoid.  Pt performed x 10' at Level 4 for total of 714 steps and 71 spm average.  Pt encouraged for concentration on LE use.  Pt amb back to w/c w/ min A.  Pt amb x 10' to mat table.  Pt requires min/CGA for transfer from lower surfaces.  Pt amb x 16'  including to turn to w/c w/ min A, occasional wobbly but not buckling.  Pt amb 16' to mat table, cues for gradual turns as well as weight acceptance to each leg, straightening knees.  Pt perormed step-pivot mat > w/c w/ in A.  Pt returned to room and remained sitting in w/c w/ seat alrm on and all needs in reach.  Second session: Pt presents supine in bed and agreeable to therapy.  Pt transfers sup to sit w/ CGA and siderails.  Pt transfers sit to stand from various surfaces range from min to CGA.  Pt requires cues for sequencing and hand placement.  Pt amb 10' x 2 w/ RW and min A, cues for knee extension w/ weight acceptance.  Pt performed standing for marching, partial squats and standing w/o UE support x 30 seconds.  Pt improved strength B  LES w/ gradual buckling w/ static standing.  Pt returned to bed w/ min A for LES.  All needs in reach.     Therapy Documentation Precautions:  Precautions Precautions: Fall Precaution Comments: incontinence Restrictions Weight Bearing Restrictions: No General:   Vital Signs:   Pain:0/10 ain, although L knee "sore" during Nu-step. Pain Assessment Pain Scale: 0-10 Pain Score: 0-No pain   Therapy/Group: Individual Therapy  Lucio Edward 11/15/2023, 12:01 PM

## 2023-11-15 NOTE — Progress Notes (Signed)
Physical Therapy Session Note  Patient Details  Name: Brian [name] MRN: [id number] Date of Birth: 15-Apr-1961  Today's Date: 11/15/2023 PT Individual Time: 0911-1021 PT Individual Time Calculation (min): 70 min   Short Term Goals: Week 1:  PT Short Term Goal 1 (Week 1): Pt will complete sit<>stand with mod assist to LRAD PT Short Term Goal 2 (Week 1): Pt will complete stand pivot transfer with LRAD and mod assist PT Short Term Goal 3 (Week 1): Pt will ambulate with LRAD 10' with mod assist and +2 for WC follow  Skilled Therapeutic Interventions/Progress Updates: Patient sitting EOB on entrance to room. Patient alert and agreeable to PT session.   Patient reported no pain at beginning of PT session. Beginning focused on adjusting pt to new WC that is higher than previous chair (to decrease required assistance for pt to stand to RW) and to increase pt morale. Pt also supplied with new Roho cushion for comfort.  Therapeutic Activity: Transfers: Pt performed sit<>stand transfers throughout session with CGA (switching WC helped out). Provided VC for anterior scoot. Pt transported from room<>main gym dependently in [facility] for time management.   Therapeutic Exercise: Pt performed the following exercises in order to prime B LE musculature prior to ambulation. Pt required rest breaks as needed throughout therex this session due to fatigue.  - B LAQ no weight added with VC to follow PTA motion due to pt using momentum to assist - B hip hikes (seated). Pt with decreased strength to flex hips in seated position (L < R).   - Pt ambulated 25' from mat in hallway using RW and with WC follow for safety. Pt provided with CGA. Pt required seated rest break due to B LE feeling like they were about to buckle (pt also reported it could also be nerves of walking - pt was cued throughout gait trial to breathe through B nostrils for increased O2 saturation, and to decrease anxiety of walking that far for the first  time while here at inpatient rehab.   - Standing marches in RW with CGA for safety. Pt cued to control movement and performed x 8 on L, and x 9 on R (alternating extremities) and required seated rest break due to L knee starting to buckle.  Patient sitting in WC at end of session with brakes locked, RN present, chair alarm set, and all needs within reach.      Therapy Documentation Precautions:  Precautions Precautions: Fall Precaution Comments: incontinence Restrictions Weight Bearing Restrictions: No  Therapy/Group: Individual Therapy  Oneda Duffett PTA 11/15/2023, 11:11 AM

## 2023-11-15 NOTE — Progress Notes (Signed)
PROGRESS NOTE   Subjective/Complaints: No acute events overnight.  No acute complaints. No urinary incontinence overnight! 1 incontinent BM, medium.  Does feel overall things are improving in this room. Overnight pulse oximetry reviewed, appears for desat events per hour with 10% in the 80 to 89% range and 87% in the 90 to 99% range; longest duration 50 seconds.  Discussed with patient that this indicates possible mild sleep apnea, but is not completely diagnostic.  He endorses ongoing issues waking up multiple times per night, not associated with anxiety, not associated with pain.  ROS: + bowel and bladder incontinence -improving, generally at night.   Bilateral lower extremity swelling-improved with lymphedema wraps  denies fevers, chills, N/V, abdominal pain, constipation, diarrhea, SOB, cough, chest pain, headache, new weakness or paraesthesias.    Objective:   No results found. Recent Labs    11/13/23 0454  WBC 4.3  HGB 10.8*  HCT 31.7*  PLT 181   Recent Labs    11/13/23 0454  NA 136  K 3.9  CL 106  CO2 23  GLUCOSE 105*  BUN 6  CREATININE 0.83  CALCIUM 8.7*    Intake/Output Summary (Last 24 hours) at 11/15/2023 0818 Last data filed at 11/15/2023 0748 Gross per 24 hour  Intake 957 ml  Output 1400 ml  Net -443 ml     Pressure Injury 11/08/23 Heel Right Deep Tissue Pressure Injury - Purple or maroon localized area of discolored intact skin or blood-filled blister due to damage of underlying soft tissue from pressure and/or shear. (Active)  11/08/23 1600  Location: Heel  Location Orientation: Right  Staging: Deep Tissue Pressure Injury - Purple or maroon localized area of discolored intact skin or blood-filled blister due to damage of underlying soft tissue from pressure and/or shear.  Wound Description (Comments):   Present on Admission: Yes    Physical Exam: Vital Signs Blood pressure (!) 124/56, pulse  83, temperature 99 F (37.2 C), temperature source Oral, resp. rate 15, height 5\' 11"  (1.803 m), weight 125.9 kg, SpO2 94%. Constitutional: No apparent distress. Appropriate appearance for age.  Morbidly obese.  Sitting up at bedside table. HENT: PERRLA.  Atraumatic, normocephalic. + Goiter -not appreciated on exam Cardiovascular: RRR, no murmurs/rub/gallops. 2+ bilateral pitting and lymphedema from the knee down,-wrapped bilaterally and Ace wraps.  Brisk capillary refill bilateral toes. Respiratory: CTAB. No rales, rhonchi, or wheezing. On RA.  Abdomen: + bowel sounds, normoactive.  Mildly distended, no  tenderness.  Skin:  + Bilateral chronic lower extremity skin thickening and discolorations; obscured now by lymphedema wraps + Bilateral buttocks and coccygeal MASD-not examined + Right heel deep tissue injury, unstageable, with underlying bruise-obscured by lymphedema wraps; palpation of bilateral heels boggy but with thickened, callused overlying skin.           MSK:      No apparent deformity.   Neurologic exam:  Cognition: [name] to person, place, time, and event. + Poor insight, memory deficit-significantly improved + Cognitive slowing-significantly improved  Strength: Antigravity and against resistance in bilateral upper extremities, 4 out of 5.  Bilateral lower extremities, now 4- out of 5 proximally and 5- out of 5 distally  Insight:  Good insight into current condition.  Mood: Pleasant affect, appropriate mood.  Sensation: Equal and intact in BL UE and Les.  Coordination: Mild bilateral upper extremity ataxia on finger-to-nose  Assessment/Plan: 1. Functional deficits which require 3+ hours per day of interdisciplinary therapy in a comprehensive inpatient rehab setting. Physiatrist is providing close team supervision and 24 hour management of active medical problems listed below. Physiatrist and rehab team continue to assess barriers to discharge/monitor patient progress  toward functional and medical goals  Care Tool:  Bathing    Body parts bathed by patient: Right arm, Left arm, Chest, Abdomen, Face   Body parts bathed by helper: Front perineal area, Buttocks, Right upper leg, Left upper leg, Right lower leg, Left lower leg     Bathing assist Assist Level: Maximal Assistance - Patient 24 - 49%     Upper Body Dressing/Undressing Upper body dressing   What is the patient wearing?: Pull over shirt    Upper body assist Assist Level: Minimal Assistance - Patient > 75%    Lower Body Dressing/Undressing Lower body dressing      What is the patient wearing?: Pants, Incontinence brief     Lower body assist Assist for lower body dressing: Total Assistance - Patient < 25%     Toileting Toileting    Toileting assist Assist for toileting: Dependent - Patient 0%     Transfers Chair/bed transfer  Transfers assist  Chair/bed transfer activity did not occur: Safety/medical concerns (Unsafet to get out of bed.)  Chair/bed transfer assist level: Moderate Assistance - Patient 50 - 74%     Locomotion Ambulation   Ambulation assist   Ambulation activity did not occur: Safety/medical concerns  Assist level: Minimal Assistance - Patient > 75% Assistive device: Walker-rolling Max distance: 10   Walk 10 feet activity   Assist  Walk 10 feet activity did not occur: Safety/medical concerns  Assist level: Moderate Assistance - Patient - 50 - 74% Assistive device: Walker-rolling   Walk 50 feet activity   Assist Walk 50 feet with 2 turns activity did not occur: Safety/medical concerns         Walk 150 feet activity   Assist Walk 150 feet activity did not occur: Safety/medical concerns         Walk 10 feet on uneven surface  activity   Assist Walk 10 feet on uneven surfaces activity did not occur: Safety/medical concerns         Wheelchair     Assist Is the patient using a wheelchair?: Yes Type of Wheelchair:  Manual    Wheelchair assist level: Moderate Assistance - Patient 50 - 74% Max wheelchair distance: 69'    Wheelchair 50 feet with 2 turns activity    Assist        Assist Level: Moderate Assistance - Patient 50 - 74%   Wheelchair 150 feet activity     Assist      Assist Level: Maximal Assistance - Patient 25 - 49%   Blood pressure (!) 124/56, pulse 83, temperature 99 F (37.2 C), temperature source Oral, resp. rate 15, height 5\' 11"  (1.803 m), weight 125.9 kg, SpO2 94%.  Medical Problem List and Plan: 1. Functional deficits secondary to acute encephalopathy, hepatic and possible metabolic component             -patient may shower             -ELOS/Goals: 10-14 days SPV - DC 11/28/23             -  Stable to continue CIR  -11/26: Wife not returning calls per SLP. Remains Max A for LBD, min-mod transfers, walking 35 feet with RW Mod A with +2 WC follow. Min A for basic problem solving, Mod A for memory and awareness.   2.  Antithrombotics: -DVT/anticoagulation:  Pharmaceutical: Lovenox             -antiplatelet therapy: N/A 3.Left knee pain: XR ordered, voltaren gel ordered   - 11/21: No complaints today. 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  May benefit from hinged knee brace when weightbearing, or injection if medically stable.  Monitor today for therapy assessments.  - 11/22: With PT, denied pain during session yesterday with ambulation and transfers. No adjustments at this time.   4. Mood/Behavior/Sleep: Provide emotional support             -antipsychotic agents: N/A  11-26: See below, applying overnight pulse ox to screen for OSA -approximately 4 events per hour, lowest desat in the 80s, not diagnostic of OSA.  Will not pursue aggressively water.  5. Neuropsych/cognition: This patient is capable of making decisions on his own behalf. 6. Skin/Wound Care: Routine skin checks..Excoriation of buttocks with wound care as  directed -Low-air-loss mattress, Mepilex dressing to bilateral heels to prevent breakdown--add Prevalon boot RLE 11/22; never applied, re-ordered 11/27 bilaterally d/t boggy heels.  -Goodhart's Butt cream to prevent worsening MASD -Hydrocyn cream to bilateral legs for flaking, dry skin -11-23: Nursing order to increase cream to twice daily, over legs and feet, and to apply Ace wrapping from feet to mid shin to prevent worsening dependent edema. 11-24: Much improved appearance of bilateral lower extremities, continue regimen of wrapping and hydrating cream. 11-26: Lymphedema wraps now on.  Concern regarding ongoing monitoring of right heel DTI with these wraps; prevalon boots as above.    7. Fluids/Electrolytes/Nutrition: Routine in and outs with follow-up chemistries  -11-21: Sodium 133, repeat in a.m. for trend.  Otherwise labs stable.  11-22: Improved 134 today; next labs on Monday, monitor for cognitive changes  8.  Colitis versus ileitis-acute appendicitis ruled out.  Follow-up outpatient general surgery  11-24: Remains with frequent loose bowel movements overnight.  Lactulose held/as needed.  Will need to continue targeted bowel movements at least 2 to 3/day, however did add Imodium for excessive bowel movements.  No current concern for infection, vital stable and no white count.  11-25/26: Unchanged, ongoing.  Questional contribution of cognitive deficits.  Bowel movements do seem to be slowing down.  9.  Hypertension.  Blood pressure remains a bit soft remains on ProAmatine 10 mg 3 times daily as well as low-dose Coreg 12.5 mg twice daily.   - 11/21: Diastolic hypotension as below; resume midodrine 5 mg 2 times daily and wean as tolerated -stable 11-23  11/25-27 BP well-controlled     11/15/2023    4:28 AM 11/15/2023    3:34 AM 11/14/2023   10:03 PM  Vitals with BMI  Weight 277 lbs 8 oz    BMI 38.72    Systolic  124   Diastolic  56   Pulse  83 75    10.  Chronic lymphedema.   Follow-up outpatient.  -PT order for lymphedema wrapping and assistance and education, management 11-22 -11-23: Per PT, only 1 PT in the hospital provide lymphedema management, looking into getting them in the room to assist in education this coming week -11/24 nursing providing Ace wrapping to bilateral lower extremities, does appear to be  helping. 11/25 reports gradually improving, continue current regimen 11-26: PT Elray Mcgregor applied lymphedema wraps and educated patient on care 11-25; very much appreciate her help in this!  Patient comfortable, continue current management; PT to follow-up for tolerance and rewrap as needed and follow measurements until stable.  11.History of alcohol use.  Counseling 12.  Tobacco use.  Counseling 13.  Obesity.  BMI 37.08.  Dietary follow-up 14.  Prediabetes.  Hemoglobin A1c 5.8. d/c ISS and start metformin ER release with dinner, creatinine reviewed and is stable -Blood sugars well-controlled, DC BG checks  11/25 glucose stable on BMP  15.  BPH/bladder incontinence/bowel incontinence.  Flomax 0.4 mg daily. BP reviewed and is stable   -11-23: DC Flomax, continue timed toileting and PVRs  11-24: PVRs remain low, remains continent during the day and incontinent at night.  Continue timed toileting.  11-26: Uncertain what barriers to continence are at this time, patient states that he sleeps through episodes of incontinence although he does feel the urge to go and knows to call the nurse for assistance.  Labs stable as above.  Will apply overnight pulse ox to assess for OSA/disordered sleep.  11-27: OSA evaluation nondiagnostic as above.  Incontinence that seems to be improving as bowel movements slow down.  Continue timed toileting and monitoring.   16. Hyperammonemia.  Hepatic steatosis seen on intake CT.  - 11/21: Seems more encephalopathic today, last ammonia was increased from 40s to 60s, add on to a.m. labs and resume lactulose 7.5 mg twice daily if  worsening  11-22: Ammonia within normal limits.  Continues having 4-5 stools per day.  Change lactulose to 10 mg twice daily as needed to target at least 3 stools per day.  Cognition does seem to be improving.  17. Anemia.  hemoglobin decreased on intake from 12,6 to 10.7.  FOBT, H&H in AM. -11-22: Hemoglobin improved to 11.4, FOBT pending but no obvious blood in stool.  11/25 FOBT negative yesterday, hemoglobin 10.8 today-stable since 11/21  LOS: 7 days A FACE TO FACE EVALUATION WAS PERFORMED  Angelina Sheriff 11/15/2023, 8:18 AM

## 2023-11-16 DIAGNOSIS — E871 Hypo-osmolality and hyponatremia: Secondary | ICD-10-CM

## 2023-11-16 DIAGNOSIS — R32 Unspecified urinary incontinence: Secondary | ICD-10-CM

## 2023-11-16 DIAGNOSIS — R159 Full incontinence of feces: Secondary | ICD-10-CM

## 2023-11-16 NOTE — Progress Notes (Signed)
PROGRESS NOTE   Subjective/Complaints: No acute events overnight noted.  Patient reports pain is under control.  Reports he had a bowel movement yesterday.  Patient reports he slept okay last night.  ROS: + bowel and bladder incontinence -improving, generally at night.   Bilateral lower extremity swelling-improved with lymphedema wraps +vinsomnia improved  denies fevers, chills, N/V, abdominal pain, constipation, diarrhea, SOB, cough, chest pain, headache, new weakness or paraesthesias.    Objective:   No results found. No results for input(s): "WBC", "HGB", "HCT", "PLT" in the last 72 hours.  No results for input(s): "NA", "K", "CL", "CO2", "GLUCOSE", "BUN", "CREATININE", "CALCIUM" in the last 72 hours.   Intake/Output Summary (Last 24 hours) at 11/16/2023 1314 Last data filed at 11/16/2023 0739 Gross per 24 hour  Intake 480 ml  Output 500 ml  Net -20 ml     Pressure Injury 11/08/23 Heel Right Deep Tissue Pressure Injury - Purple or maroon localized area of discolored intact skin or blood-filled blister due to damage of underlying soft tissue from pressure and/or shear. (Active)  11/08/23 1600  Location: Heel  Location Orientation: Right  Staging: Deep Tissue Pressure Injury - Purple or maroon localized area of discolored intact skin or blood-filled blister due to damage of underlying soft tissue from pressure and/or shear.  Wound Description (Comments):   Present on Admission: Yes    Physical Exam: Vital Signs Blood pressure 130/68, pulse 77, temperature 98.4 F (36.9 C), temperature source Oral, resp. rate 15, height 5\' 11"  (1.803 m), weight 124 kg, SpO2 95%. Constitutional: No apparent distress. Appropriate appearance for age.  Morbidly obese.  Sitting in bed HENT: PERRLA.  Atraumatic, normocephalic. + Goiter -not appreciated on exam Cardiovascular: RRR, no murmurs/rub/gallops. 2+ bilateral pitting and lymphedema  from the knee down,-wrapped bilaterally and Ace wraps.  Brisk capillary refill bilateral toes. Respiratory: CTAB. No rales, rhonchi, or wheezing. On RA.  Abdomen: + bowel sounds,  Mildly distended, no  tenderness.  Skin:  + Bilateral chronic lower extremity skin thickening and discolorations; obscured now by lymphedema wraps + Bilateral buttocks and coccygeal MASD-not examined + Right heel deep tissue injury, unstageable, with underlying bruise-obscured by lymphedema wraps; palpation of bilateral heels boggy but with thickened, callused overlying skin.           MSK:     No joint swelling noted   Neurologic exam:  Cognition: [name] to person, place, time, and event. + Poor insight, memory deficit-significantly improved + Cognitive slowing-significantly improved  Strength: Antigravity and against resistance in bilateral upper extremities, 4 out of 5.  Bilateral lower extremities, now 4- out of 5 proximally and 5- out of 5 distally  Mood: Pleasant affect, appropriate mood.  Sensation: Equal and intact in BL UE and Les.  Coordination: Mild bilateral upper extremity ataxia on finger-to-nose  Assessment/Plan: 1. Functional deficits which require 3+ hours per day of interdisciplinary therapy in a comprehensive inpatient rehab setting. Physiatrist is providing close team supervision and 24 hour management of active medical problems listed below. Physiatrist and rehab team continue to assess barriers to discharge/monitor patient progress toward functional and medical goals  Care Tool:  Bathing    Body parts bathed  by patient: Right arm, Left arm, Chest, Abdomen, Face   Body parts bathed by helper: Front perineal area, Buttocks, Right upper leg, Left upper leg, Right lower leg, Left lower leg     Bathing assist Assist Level: Maximal Assistance - Patient 24 - 49%     Upper Body Dressing/Undressing Upper body dressing   What is the patient wearing?: Pull over shirt    Upper  body assist Assist Level: Minimal Assistance - Patient > 75%    Lower Body Dressing/Undressing Lower body dressing      What is the patient wearing?: Pants, Incontinence brief     Lower body assist Assist for lower body dressing: Total Assistance - Patient < 25%     Toileting Toileting    Toileting assist Assist for toileting: Dependent - Patient 0%     Transfers Chair/bed transfer  Transfers assist  Chair/bed transfer activity did not occur: Safety/medical concerns (Unsafet to get out of bed.)  Chair/bed transfer assist level: Minimal Assistance - Patient > 75%     Locomotion Ambulation   Ambulation assist   Ambulation activity did not occur: Safety/medical concerns  Assist level: Minimal Assistance - Patient > 75% Assistive device: Walker-rolling Max distance: 10   Walk 10 feet activity   Assist  Walk 10 feet activity did not occur: Safety/medical concerns  Assist level: Minimal Assistance - Patient > 75% Assistive device: Walker-rolling   Walk 50 feet activity   Assist Walk 50 feet with 2 turns activity did not occur: Safety/medical concerns         Walk 150 feet activity   Assist Walk 150 feet activity did not occur: Safety/medical concerns         Walk 10 feet on uneven surface  activity   Assist Walk 10 feet on uneven surfaces activity did not occur: Safety/medical concerns         Wheelchair     Assist Is the patient using a wheelchair?: Yes Type of Wheelchair: Manual    Wheelchair assist level: Moderate Assistance - Patient 50 - 74% Max wheelchair distance: 71'    Wheelchair 50 feet with 2 turns activity    Assist        Assist Level: Moderate Assistance - Patient 50 - 74%   Wheelchair 150 feet activity     Assist      Assist Level: Maximal Assistance - Patient 25 - 49%   Blood pressure 130/68, pulse 77, temperature 98.4 F (36.9 C), temperature source Oral, resp. rate 15, height 5\' 11"  (1.803 m),  weight 124 kg, SpO2 95%.  Medical Problem List and Plan: 1. Functional deficits secondary to acute encephalopathy, hepatic and possible metabolic component             -patient may shower             -ELOS/Goals: 10-14 days SPV - DC 11/28/23             - Stable to continue CIR  -11/26: Wife not returning calls per SLP. Remains Max A for LBD, min-mod transfers, walking 35 feet with RW Mod A with +2 WC follow. Min A for basic problem solving, Mod A for memory and awareness.   2.  Antithrombotics: -DVT/anticoagulation:  Pharmaceutical: Lovenox             -antiplatelet therapy: N/A 3.Left knee pain: XR ordered, voltaren gel ordered   - 11/21: No complaints today. 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  May benefit from hinged knee brace when weightbearing, or injection if medically stable.  Monitor today for therapy assessments.  - 11/22: With PT, denied pain during session yesterday with ambulation and transfers. No adjustments at this time.   4. Mood/Behavior/Sleep: Provide emotional support             -antipsychotic agents: N/A  11-26: See below, applying overnight pulse ox to screen for OSA -approximately 4 events per hour, lowest desat in the 80s, not diagnostic of OSA.  Will not pursue aggressively water.  5. Neuropsych/cognition: This patient is capable of making decisions on his own behalf. 6. Skin/Wound Care: Routine skin checks..Excoriation of buttocks with wound care as directed -Low-air-loss mattress, Mepilex dressing to bilateral heels to prevent breakdown--add Prevalon boot RLE 11/22; never applied, re-ordered 11/27 bilaterally d/t boggy heels.  -Goodhart's Butt cream to prevent worsening MASD -Hydrocyn cream to bilateral legs for flaking, dry skin -11-23: Nursing order to increase cream to twice daily, over legs and feet, and to apply Ace wrapping from feet to mid shin to prevent worsening dependent edema. 11-24: Much improved  appearance of bilateral lower extremities, continue regimen of wrapping and hydrating cream. 11-26: Lymphedema wraps now on.  Concern regarding ongoing monitoring of right heel DTI with these wraps; prevalon boots as above.  11/27 discussed reason for Prevalon boot use   7. Fluids/Electrolytes/Nutrition: Routine in and outs with follow-up chemistries  -11-21: Sodium 133, repeat in a.m. for trend.  Otherwise labs stable.  11-22: Improved 134 today; next labs on Monday, monitor for cognitive changes  -Sodium up to 136 on 11/25  8.  Colitis versus ileitis-acute appendicitis ruled out.  Follow-up outpatient general surgery  11-24: Remains with frequent loose bowel movements overnight.  Lactulose held/as needed.  Will need to continue targeted bowel movements at least 2 to 3/day, however did add Imodium for excessive bowel movements.  No current concern for infection, vital stable and no white count.  11-25/26: Unchanged, ongoing.  Questional contribution of cognitive deficits.  Bowel movements do seem to be slowing down.  9.  Hypertension.  Blood pressure remains a bit soft remains on ProAmatine 10 mg 3 times daily as well as low-dose Coreg 12.5 mg twice daily.   - 11/21: Diastolic hypotension as below; resume midodrine 5 mg 2 times daily and wean as tolerated -stable 11-23  11/25-29 BP well-controlled     11/16/2023    5:00 AM 11/16/2023    4:35 AM 11/15/2023    7:18 PM  Vitals with BMI  Weight 273 lbs 6 oz    BMI 38.14    Systolic  130 137  Diastolic  68 67  Pulse  77 77    10.  Chronic lymphedema.  Follow-up outpatient.  -PT order for lymphedema wrapping and assistance and education, management 11-22 -11-23: Per PT, only 1 PT in the hospital provide lymphedema management, looking into getting them in the room to assist in education this coming week -11/24 nursing providing Ace wrapping to bilateral lower extremities, does appear to be helping. 11/25 reports gradually improving,  continue current regimen 11-26: PT Elray Mcgregor applied lymphedema wraps and educated patient on care 11-25; very much appreciate her help in this!  Patient comfortable, continue current management; PT to follow-up for tolerance and rewrap as needed and follow measurements until stable.  11.History of alcohol use.  Counseling 12.  Tobacco use.  Counseling 13.  Obesity.  BMI 37.08.  Dietary follow-up 14.  Prediabetes.  Hemoglobin A1c 5.8.  d/c ISS and start metformin ER release with dinner, creatinine reviewed and is stable -Blood sugars well-controlled, DC BG checks  11/25 glucose stable on BMP  15.  BPH/bladder incontinence/bowel incontinence.  Flomax 0.4 mg daily. BP reviewed and is stable   -11-23: DC Flomax, continue timed toileting and PVRs  11-24: PVRs remain low, remains continent during the day and incontinent at night.  Continue timed toileting.  11-26: Uncertain what barriers to continence are at this time, patient states that he sleeps through episodes of incontinence although he does feel the urge to go and knows to call the nurse for assistance.  Labs stable as above.  Will apply overnight pulse ox to assess for OSA/disordered sleep.  11-27: OSA evaluation nondiagnostic as above.  Incontinence that seems to be improving as bowel movements slow down.  Continue timed toileting and monitoring.  11/28 one incontinent episode yesterday however this appears to be improved overall, continues to have bowel incontinence   16. Hyperammonemia.  Hepatic steatosis seen on intake CT.  - 11/21: Seems more encephalopathic today, last ammonia was increased from 40s to 60s, add on to a.m. labs and resume lactulose 7.5 mg twice daily if worsening  11-22: Ammonia within normal limits.  Continues having 4-5 stools per day.  Change lactulose to 10 mg twice daily as needed to target at least 3 stools per day.  Cognition does seem to be improving.  17. Anemia.  hemoglobin decreased on intake from 12,6 to  10.7.  FOBT, H&H in AM. -11-22: Hemoglobin improved to 11.4, FOBT pending but no obvious blood in stool.  11/25 FOBT negative yesterday, hemoglobin 10.8 today-stable since 11/21  LOS: 8 days A FACE TO FACE EVALUATION WAS PERFORMED  Fanny Dance 11/16/2023, 1:14 PM

## 2023-11-16 NOTE — Plan of Care (Signed)
  Problem: Consults Goal: RH GENERAL PATIENT EDUCATION Description: See Patient Education module for education specifics. Outcome: Progressing Goal: Skin Care Protocol Initiated - if Braden Score 18 or less Description: If consults are not indicated, leave blank or document N/A Outcome: Progressing   Problem: RH BOWEL ELIMINATION Goal: RH STG MANAGE BOWEL WITH ASSISTANCE Description: STG Manage Bowel with min Assistance. Outcome: Progressing Goal: RH STG MANAGE BOWEL W/MEDICATION W/ASSISTANCE Description: STG Manage Bowel with Medication with min Assistance. Outcome: Progressing   Problem: RH BLADDER ELIMINATION Goal: RH STG MANAGE BLADDER WITH ASSISTANCE Description: STG Manage Bladder With min Assistance Outcome: Progressing   Problem: RH SKIN INTEGRITY Goal: RH STG SKIN FREE OF INFECTION/BREAKDOWN Description: MASD will improve and skin will be free of infection without additional breakdown with min assist  Outcome: Progressing Goal: RH STG MAINTAIN SKIN INTEGRITY WITH ASSISTANCE Description: STG Maintain Skin Integrity With min Assistance. Outcome: Progressing Goal: RH STG ABLE TO PERFORM INCISION/WOUND CARE W/ASSISTANCE Description: STG Able To Perform Incision/Wound Care With min Assistance. Outcome: Progressing   Problem: RH SAFETY Goal: RH STG ADHERE TO SAFETY PRECAUTIONS W/ASSISTANCE/DEVICE Description: STG Adhere to Safety Precautions With min Assistance/Device. Outcome: Progressing Goal: RH STG DECREASED RISK OF FALL WITH ASSISTANCE Description: STG Decreased Risk of Fall With cueing Assistance. Outcome: Progressing   Problem: RH SAFETY Goal: RH STG ADHERE TO SAFETY PRECAUTIONS W/ASSISTANCE/DEVICE Description: STG Adhere to Safety Precautions With min Assistance/Device. Outcome: Progressing Goal: RH STG DECREASED RISK OF FALL WITH ASSISTANCE Description: STG Decreased Risk of Fall With cueing Assistance. Outcome: Progressing   Problem: RH PAIN  MANAGEMENT Goal: RH STG PAIN MANAGED AT OR BELOW PT'S PAIN GOAL Description: Less than 2 with PRN medications min assist  Outcome: Progressing   Problem: RH KNOWLEDGE DEFICIT GENERAL Goal: RH STG INCREASE KNOWLEDGE OF SELF CARE AFTER HOSPITALIZATION Description: Patient/caregiver will be able to manage medications, self care, diet/lifestyle modifications to improve overall health from nursing education, nursing handouts and other resources independently  Outcome: Progressing   Problem: Consults Goal: RH GENERAL PATIENT EDUCATION Description: See Patient Education module for education specifics. Outcome: Progressing   Problem: Education: Goal: Ability to describe self-care measures that may prevent or decrease complications (Diabetes Survival Skills Education) will improve Outcome: Progressing Goal: Individualized Educational Video(s) Outcome: Progressing   Problem: Coping: Goal: Ability to adjust to condition or change in health will improve Outcome: Progressing   Problem: Fluid Volume: Goal: Ability to maintain a balanced intake and output will improve Outcome: Progressing

## 2023-11-17 LAB — OCCULT BLOOD X 1 CARD TO LAB, STOOL: Fecal Occult Bld: NEGATIVE

## 2023-11-17 NOTE — Progress Notes (Signed)
 Stool sample collected and sent to lab.   Tilden Dome, LPN

## 2023-11-17 NOTE — Progress Notes (Signed)
PROGRESS NOTE   Subjective/Complaints: No acute events overnight noted.  Patient reports he enjoyed some of the [place] leg that his family brought over yesterday.  No new concerns this morning.  ROS: + bowel and bladder incontinence -improving, generally at night.   Bilateral lower extremity swelling-improved with lymphedema wraps +insomnia improved  denies fevers, chills, N/V, abdominal pain, constipation, diarrhea, SOB, cough, chest pain, headache, new weakness or new sensory changes or paraesthesias.    Objective:   No results found. No results for input(s): "WBC", "HGB", "HCT", "PLT" in the last 72 hours.  No results for input(s): "NA", "K", "CL", "CO2", "GLUCOSE", "BUN", "CREATININE", "CALCIUM" in the last 72 hours.   Intake/Output Summary (Last 24 hours) at 11/17/2023 1139 Last data filed at 11/17/2023 1023 Gross per 24 hour  Intake 476 ml  Output 1225 ml  Net -749 ml     Pressure Injury 11/08/23 Heel Right Deep Tissue Pressure Injury - Purple or maroon localized area of discolored intact skin or blood-filled blister due to damage of underlying soft tissue from pressure and/or shear. (Active)  11/08/23 1600  Location: Heel  Location Orientation: Right  Staging: Deep Tissue Pressure Injury - Purple or maroon localized area of discolored intact skin or blood-filled blister due to damage of underlying soft tissue from pressure and/or shear.  Wound Description (Comments):   Present on Admission: Yes    Physical Exam: Vital Signs Blood pressure 136/66, pulse 75, temperature 98.5 F (36.9 C), resp. rate 18, height 5\' 11"  (1.803 m), weight 123.4 kg, SpO2 94%. Constitutional: No apparent distress. Appropriate appearance for age.  Morbidly obese.  Sitting in wheelchair, therapy came in the room to begin the session  HENT: PERRLA.  Atraumatic, normocephalic. + Goiter -not appreciated on exam Cardiovascular: RRR, no  murmurs/rub/gallops. 2+ bilateral pitting and lymphedema from the knee down,-wrapped bilaterally and Ace wraps.  Brisk capillary refill bilateral toes. Respiratory: CTAB. No rales, rhonchi, or wheezing. On RA.  Abdomen: + bowel sounds,  Mildly distended, no  tenderness.  Skin:  + Bilateral chronic lower extremity skin thickening and discolorations; obscured now by lymphedema wraps + Bilateral buttocks and coccygeal MASD-not examined + Right heel deep tissue injury, unstageable, with underlying bruise-obscured by lymphedema wraps; palpation of bilateral heels boggy but with thickened, callused overlying skin.           MSK:     No joint swelling noted   Neurologic exam:  Cognition: [name] to person, place, time, and event.  Follows commands + Poor insight, memory deficit-significantly improved + Cognitive slowing-much improved  Strength: Antigravity and against resistance in bilateral upper extremities, 4 out of 5.  Bilateral lower extremities, now 4 out of 5 proximally and 5- out of 5 distally  Mood: Pleasant affect, appropriate mood.  Sensation: Equal and intact in BL UE and Les.  Coordination: Mild bilateral upper extremity ataxia on finger-to-nose  Assessment/Plan: 1. Functional deficits which require 3+ hours per day of interdisciplinary therapy in a comprehensive inpatient rehab setting. Physiatrist is providing close team supervision and 24 hour management of active medical problems listed below. Physiatrist and rehab team continue to assess barriers to discharge/monitor patient progress toward functional and  medical goals  Care Tool:  Bathing    Body parts bathed by patient: Right arm, Left arm, Chest, Abdomen, Face   Body parts bathed by helper: Front perineal area, Buttocks, Right upper leg, Left upper leg, Right lower leg, Left lower leg     Bathing assist Assist Level: Maximal Assistance - Patient 24 - 49%     Upper Body Dressing/Undressing Upper body  dressing   What is the patient wearing?: Pull over shirt    Upper body assist Assist Level: Minimal Assistance - Patient > 75%    Lower Body Dressing/Undressing Lower body dressing      What is the patient wearing?: Pants, Incontinence brief     Lower body assist Assist for lower body dressing: Total Assistance - Patient < 25%     Toileting Toileting    Toileting assist Assist for toileting: Dependent - Patient 0%     Transfers Chair/bed transfer  Transfers assist  Chair/bed transfer activity did not occur: Safety/medical concerns (Unsafet to get out of bed.)  Chair/bed transfer assist level: Minimal Assistance - Patient > 75%     Locomotion Ambulation   Ambulation assist   Ambulation activity did not occur: Safety/medical concerns  Assist level: Minimal Assistance - Patient > 75% Assistive device: Walker-rolling Max distance: 10   Walk 10 feet activity   Assist  Walk 10 feet activity did not occur: Safety/medical concerns  Assist level: Minimal Assistance - Patient > 75% Assistive device: Walker-rolling   Walk 50 feet activity   Assist Walk 50 feet with 2 turns activity did not occur: Safety/medical concerns         Walk 150 feet activity   Assist Walk 150 feet activity did not occur: Safety/medical concerns         Walk 10 feet on uneven surface  activity   Assist Walk 10 feet on uneven surfaces activity did not occur: Safety/medical concerns         Wheelchair     Assist Is the patient using a wheelchair?: Yes Type of Wheelchair: Manual    Wheelchair assist level: Moderate Assistance - Patient 50 - 74% Max wheelchair distance: 13'    Wheelchair 50 feet with 2 turns activity    Assist        Assist Level: Moderate Assistance - Patient 50 - 74%   Wheelchair 150 feet activity     Assist      Assist Level: Maximal Assistance - Patient 25 - 49%   Blood pressure 136/66, pulse 75, temperature 98.5 F (36.9  C), resp. rate 18, height 5\' 11"  (1.803 m), weight 123.4 kg, SpO2 94%.  Medical Problem List and Plan: 1. Functional deficits secondary to acute encephalopathy, hepatic and possible metabolic component             -patient may shower             -ELOS/Goals: 10-14 days SPV - DC 11/28/23             - Stable to continue CIR  -11/26: Wife not returning calls per SLP. Remains Max A for LBD, min-mod transfers, walking 35 feet with RW Mod A with +2 WC follow. Min A for basic problem solving, Mod A for memory and awareness.   2.  Antithrombotics: -DVT/anticoagulation:  Pharmaceutical: Lovenox             -antiplatelet therapy: N/A 3.Left knee pain: XR ordered, voltaren gel ordered   - 11/21: No complaints today. 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  May benefit from hinged knee brace when weightbearing, or injection if medically stable.  Monitor today for therapy assessments.  - 11/22: With PT, denied pain during session yesterday with ambulation and transfers. No adjustments at this time.   4. Mood/Behavior/Sleep: Provide emotional support             -antipsychotic agents: N/A  11-26: See below, applying overnight pulse ox to screen for OSA -approximately 4 events per hour, lowest desat in the 80s, not diagnostic of OSA.  Will not pursue aggressively water.  5. Neuropsych/cognition: This patient is capable of making decisions on his own behalf. 6. Skin/Wound Care: Routine skin checks..Excoriation of buttocks with wound care as directed -Low-air-loss mattress, Mepilex dressing to bilateral heels to prevent breakdown--add Prevalon boot RLE 11/22; never applied, re-ordered 11/27 bilaterally d/t boggy heels.  -Goodhart's Butt cream to prevent worsening MASD -Hydrocyn cream to bilateral legs for flaking, dry skin -11-23: Nursing order to increase cream to twice daily, over legs and feet, and to apply Ace wrapping from feet to mid shin to prevent  worsening dependent edema. 11-24: Much improved appearance of bilateral lower extremities, continue regimen of wrapping and hydrating cream. 11-26: Lymphedema wraps now on.  Concern regarding ongoing monitoring of right heel DTI with these wraps; prevalon boots as above.  11/27 discussed reason for Prevalon boot use   7. Fluids/Electrolytes/Nutrition: Routine in and outs with follow-up chemistries  -11-21: Sodium 133, repeat in a.m. for trend.  Otherwise labs stable.  11-22: Improved 134 today; next labs on Monday, monitor for cognitive changes  -Sodium up to 136 on 11/25  8.  Colitis versus ileitis-acute appendicitis ruled out.  Follow-up outpatient general surgery  11-24: Remains with frequent loose bowel movements overnight.  Lactulose held/as needed.  Will need to continue targeted bowel movements at least 2 to 3/day, however did add Imodium for excessive bowel movements.  No current concern for infection, vital stable and no white count.  11-25/26: Unchanged, ongoing.  Questional contribution of cognitive deficits.  Bowel movements do seem to be slowing down.  9.  Hypertension.  Blood pressure remains a bit soft remains on ProAmatine 10 mg 3 times daily as well as low-dose Coreg 12.5 mg twice daily.   - 11/21: Diastolic hypotension as below; resume midodrine 5 mg 2 times daily and wean as tolerated -stable 11-23  11/29 BP  well-controlled, continue current regimen.  Continue midodrine     11/17/2023    5:00 AM 11/17/2023    4:11 AM 11/16/2023    7:16 PM  Vitals with BMI  Weight 272 lbs    BMI 37.95    Systolic  136 135  Diastolic  66 62  Pulse  75 81    10.  Chronic lymphedema.  Follow-up outpatient.  -PT order for lymphedema wrapping and assistance and education, management 11-22 -11-23: Per PT, only 1 PT in the hospital provide lymphedema management, looking into getting them in the room to assist in education this coming week -11/24 nursing providing Ace wrapping to  bilateral lower extremities, does appear to be helping. 11/25 reports gradually improving, continue current regimen 11-26: PT Elray Mcgregor applied lymphedema wraps and educated patient on care 11-25; very much appreciate her help in this!  Patient comfortable, continue current management; PT to follow-up for tolerance and rewrap as needed and follow measurements until stable.  11.History of alcohol use.  Counseling 12.  Tobacco use.  Counseling 13.  Obesity.  BMI 37.08.  Dietary follow-up 14.  Prediabetes.  Hemoglobin A1c 5.8. d/c ISS and start metformin ER release with dinner, creatinine reviewed and is stable -Blood sugars well-controlled, DC BG checks  11/25 glucose stable on BMP  15.  BPH/bladder incontinence/bowel incontinence.  Flomax 0.4 mg daily. BP reviewed and is stable   -11-23: DC Flomax, continue timed toileting and PVRs  11-24: PVRs remain low, remains continent during the day and incontinent at night.  Continue timed toileting.  11-26: Uncertain what barriers to continence are at this time, patient states that he sleeps through episodes of incontinence although he does feel the urge to go and knows to call the nurse for assistance.  Labs stable as above.  Will apply overnight pulse ox to assess for OSA/disordered sleep.  11-27: OSA evaluation nondiagnostic as above.  Incontinence that seems to be improving as bowel movements slow down.  Continue timed toileting and monitoring.  11/28 one incontinent episode yesterday however this appears to be improved overall, continues to have bowel incontinence  11/29 continent since yesterday, improved   16. Hyperammonemia.  Hepatic steatosis seen on intake CT.  - 11/21: Seems more encephalopathic today, last ammonia was increased from 40s to 60s, add on to a.m. labs and resume lactulose 7.5 mg twice daily if worsening  11-22: Ammonia within normal limits.  Continues having 4-5 stools per day.  Change lactulose to 10 mg twice daily as needed  to target at least 3 stools per day.  Cognition does seem to be improving.  17. Anemia.  hemoglobin decreased on intake from 12,6 to 10.7.  FOBT, H&H in AM. -11-22: Hemoglobin improved to 11.4, FOBT pending but no obvious blood in stool.  11/25 FOBT negative yesterday, hemoglobin 10.8 today-stable since 11/21  LOS: 9 days A FACE TO FACE EVALUATION WAS PERFORMED  Fanny Dance 11/17/2023, 11:39 AM

## 2023-11-17 NOTE — Progress Notes (Signed)
Physical Therapy Weekly Progress Note  Patient Details  Name: Brian [name] MRN: [id number] Date of Birth: 17-Apr-1961  Beginning of progress report period: November 09, 2023 End of progress report period: November 17, 2023  Today's Date: 11/17/2023 PT Individual Time: 1035-1130 + 4098-1191 PT Individual Time Calculation (min): 55 min + 40 min  Patient has met 3 of 3 short term goals.  Pt is making steady progress towards functional goals. Pt is currently min assist overall for transfers, bed mobility, gait up to 20' with RW. Pt has received a consult for BLE lymphedema management. Continue plan of care.  Patient continues to demonstrate the following deficits muscle weakness, decreased cardiorespiratoy endurance, and decreased standing balance, decreased postural control, and decreased balance strategies and therefore will continue to benefit from skilled PT intervention to increase functional independence with mobility.  Patient progressing toward long term goals..  Continue plan of care.  PT Short Term Goals Week 1:  PT Short Term Goal 1 (Week 1): Pt will complete sit<>stand with mod assist to LRAD PT Short Term Goal 1 - Progress (Week 1): Met PT Short Term Goal 2 (Week 1): Pt will complete stand pivot transfer with LRAD and mod assist PT Short Term Goal 2 - Progress (Week 1): Met PT Short Term Goal 3 (Week 1): Pt will ambulate with LRAD 10' with mod assist and +2 for WC follow PT Short Term Goal 3 - Progress (Week 1): Met Week 2:  PT Short Term Goal 1 (Week 2): Pt will ambulate 31' with CGA with LRAD PT Short Term Goal 2 (Week 2): Pt will consistently complete transfers with CGA with LRAD PT Short Term Goal 3 (Week 2): Pt will complete up/down 5" curb with min assist with LRAD  Skilled Therapeutic Interventions/Progress Updates:  SESSION 1: Pt presents in room in [facility], motivated to participate with PT. Pt denies pain. Session focused on gait training, transfer training, and  therapeutic exercise to promote BLE strengthening and muscle fiber recruitment needed for functional transfers and ambulation.  Pt transported to main gym via WC dependently for time management. Pt completes sit<>stand from [facility] to RW with min assist, completes ambulatory transfer 5' with RW min assist to mat. Pt completes x10 sit<>stands from mat to RW with mod verbal cues for hand placement, completed with CGA therapist with light CGA for bilateral knees.  Pt completes ambulation 4x20' with RW min assist, x1 instance L knee buckle with min assist to correct. Verbal cues for upright posture and gaze as well as positioning prior to sitting.  Pt completes 2 steps forward/2 steps backward 2x5 reps with RW to promote improved multidirectional stepping stability and positioning prior to sitting, x1 instance L knee buckle no LOB requires min assist to correct.  Pt completes step taps to 3" step with BUE support on BHRs, x6 alternating BLE for first trial, x10 alternating BLE for 2nd trial. Pt demonstrates R knee buckle with fatigue with tapping LLE to step, no LOB but requires min assist to maintain upright posture.   Pt self propels WC ~50' with BLEs as therapeutic exercise for BLE strengthening and muscular endurance needed for gait and transfers, min assist for initiating and maintaining momentum.  Pt returns to room and remains seated in [facility] with all needs within reach, cal light in place and chair alarm donned and activated at end of session. Therapist places [facility] over toilet for pt   SESSION 2: Pt presents in room in [facility], motivated to participate with  PT. Pt denies pain at this time. Session focused on therex for BLE strengthening and activity tolerance.  Pt transported from room to ortho gym dependently for time management. Pt completes stand pivot transfer with CGA with RW to nustep. Pt completes nustep 5 min with BUE/BLE and with BLEs only, workload 6. Pt maintains 75-80 spm with BUE/BLE,  maintains 45-60spm with BLEs only. Total 596 steps.  Pt completes transfer with RW back to WC. Pt then completes interval standing therex with BUE support on RW (1 min rest between exercises) to promote BLE strengthening and tolerance to upright including: - standing marches x20 alternating BLEs - standing mini squats x10 - standing heel raise x15 - standing hamstring curl x20 alternating BLE - standing hip abduction x5 BLE and side step with RW R/L x4 (cues for sequencing RW)  Pt returns to room and remains seated in [facility] with all needs within reach, cal light in place and chair alarm donned and activated at end of session.  Ambulation/gait training;Balance/vestibular training;DME/adaptive equipment instruction;Psychosocial support;Skin care/wound management;UE/LE Strength taining/ROM;UE/LE Coordination activities;Cognitive remediation/compensation;Functional mobility training;Splinting/orthotics;Visual/perceptual remediation/compensation;Community reintegration;Neuromuscular re-education;Stair training;Wheelchair propulsion/positioning;Discharge planning;Pain management;Therapeutic Activities;Disease management/prevention;Patient/family education;Therapeutic Exercise   Therapy Documentation Precautions:  Precautions Precautions: Fall Precaution Comments: incontinence Restrictions Weight Bearing Restrictions: No   Therapy/Group: Individual Therapy  Edwin Cap PT, DPT 11/17/2023, 12:19 PM

## 2023-11-17 NOTE — Progress Notes (Signed)
Speech Language Pathology Weekly Progress and Session Note  Patient Details  Name: Brian [name] MRN: [id number] Date of Birth: 1961/04/05  Beginning of progress report period: November 09, 2023 End of progress report period: November 17, 2023  Today's Date: 11/17/2023 SLP Individual Time: 1345-1441 SLP Individual Time Calculation (min): 56 min  Short Term Goals: Week 1: SLP Short Term Goal 1 (Week 1): Patient will orient to date, time, situation, and person given external aids and min verbal cues. SLP Short Term Goal 1 - Progress (Week 1): Met SLP Short Term Goal 2 (Week 1): Patient will utilize external and internal memory aids to recall daily information with mod verbal cues. SLP Short Term Goal 2 - Progress (Week 1): Met SLP Short Term Goal 3 (Week 1): Patient will state two cognitive and two physical impairments demonstrating intellectual awareness given mod assist. SLP Short Term Goal 3 - Progress (Week 1): Met SLP Short Term Goal 4 (Week 1): Patient will solve basic environmental problems in 4/5 opportunities given mod assist. SLP Short Term Goal 4 - Progress (Week 1): Met  New Short Term Goals: Week 2: SLP Short Term Goal 1 (Week 2): Patient will orient to date, time, situation, and person given external aids and supervision verbal cues. SLP Short Term Goal 2 (Week 2): Patient will utilize external and internal memory aids to recall daily information with supervision verbal cues. SLP Short Term Goal 3 (Week 2): Patient will state two cognitive and two physical impairments demonstrating intellectual awareness given min assist. SLP Short Term Goal 4 (Week 2): Patient will solve basic environmental problems in 4/5 opportunities given min assist.  Weekly Progress Updates: Patient is making excellent gains towards therapy goals, meeting 4/4 short term goals set this reporting period. Patient is currently oriented x4 given supervision-min assist and recalls daily information given  supervision-min assist. Patient can state physical deficits with min assist but requires min-mod to state lingering cognitive deficits. During basic problem solving tasks, patient benefits from min up to mod assist. Overall, patient's improvements have been stark, thus goals upgraded to reflect progress made thus far. Goals upgraded from min assist to supervision overall. Patient and family education ongoing. Patient will continue to benefit from skilled therapy services during remainder of CIR stay.    Intensity: Minumum of 1-2 x/day, 30 to 90 minutes Frequency: 3 to 5 out of 7 days Duration/Length of Stay: 12/10 Treatment/Interventions: Cognitive remediation/compensation;Cueing hierarchy;Functional tasks;Therapeutic Activities;Internal/external aids;Patient/family education  Daily Session  Skilled Therapeutic Interventions: SLP conducted skilled therapy session targeting cognitive retaining goals. Patient reports that he continues to cognitively improve but does endorse that he is still not back to normal. Patient recalled events from earlier in the day with supervision-min assist. SLP guided patient through basic-mildly complex following directions task with patient benefiting from min up to mod assist in select instances. Patient requires mod assist to identify cognitive deficits but only supervision-min to state physical deficits. Patient oriented x4 given supervision-min assist. Patient was left in chair with call bell in reach and chair alarm set. SLP will continue to target goals per plan of care.      Pain Pain Assessment Pain Scale: 0-10 Pain Score: 0-No pain  Therapy/Group: Individual Therapy  Jeannie Done, M.A., CCC-SLP  [name] 11/17/2023, 4:07 PM

## 2023-11-17 NOTE — Progress Notes (Signed)
Occupational Therapy Weekly Progress Note  Patient Details  Name: Brian [name] MRN: [id number] Date of Birth: [birth year]  Beginning of progress report period: November 09, 2023 End of progress report period: November 17, 2023  Today's Date: 11/17/2023 OT Individual Time: 4098-1191 OT Individual Time Calculation (min): 55 min    Patient has met 3 of 3 short term goals.  Patient is making steady progress towards OT goals. He is able to complete sit<>stands and stand-pivot transfers with min A. Pt's overall strength and endurance have improved within functional BADL tasks. Continue current POC.   Patient continues to demonstrate the following deficits: muscle weakness, decreased cardiorespiratoy endurance, and decreased standing balance and decreased balance strategies and therefore will continue to benefit from skilled OT intervention to enhance overall performance with BADL and iADL.  Patient progressing toward long term goals..  Continue plan of care.  OT Short Term Goals Week 1:  OT Short Term Goal 1 (Week 1): Patient will complete toilet transfer with max A of 1 OT Short Term Goal 1 - Progress (Week 1): Met OT Short Term Goal 2 (Week 1): Pt will complete 1 step of toileting OT Short Term Goal 2 - Progress (Week 1): Met OT Short Term Goal 3 (Week 1): Patient will stand with max A of 1 OT Short Term Goal 3 - Progress (Week 1): Met Week 2:  OT Short Term Goal 1 (Week 2): Patient will complete 1 step of toileting task OT Short Term Goal 2 (Week 2): Patient will complete toilet transfer with CGA OT Short Term Goal 3 (Week 2): Patient will maintain standing at the sink for 3 minutes in preparation for BADL tasks.  Skilled Therapeutic Interventions/Progress Updates:    Pt greeted semi-reclined in bed awake and agreeable to OT treatment session. Pt had some urinary incontinence in brief, but we got up to the sink for full wash up. Pt completed bed mobility w/ HOB elevated and  supervision. Min A sit<>stand W/RW and min A to pivot to wc. Pt brought to sink in wc where he completed UB bathing/dressing with set-up A. Sit<>stands with min A, then CGA for balance at the sink while pt washed bottom and peri-area. Education provided for alternating UB support for balance. Pt demonstrated improved endurance in standing. We worked on LB dressing and reaching down to his feet to thread pant legs. Mod A to complete this task. Worked on standing balance/endurance and LB strengthening with standing there-ex at the sink. Standing hip adduction 3 sets of 10 with rest breaks in between. Pt left seated in wc at end of session with alarm on, call bell in reach, and needs met.   Therapy Documentation Precautions:  Precautions Precautions: Fall Precaution Comments: incontinence Restrictions Weight Bearing Restrictions: (P) No Pain:  Denies pain  Therapy/Group: Individual Therapy  Mal Amabile 11/17/2023, 9:42 AM

## 2023-11-17 NOTE — Plan of Care (Signed)
Goals upgraded to reflect progress made thus far and remaining ELOS:  Problem: RH Cognition - SLP Goal: RH LTG Patient will demonstrate orientation with cues Description:  LTG:  Patient will demonstrate orientation to person/place/time/situation with cues (SLP)   Flowsheets (Taken 11/09/2023 1554) LTG Patient will demonstrate orientation to:  Person  Place  Time  Situation LTG: Patient will demonstrate orientation using cueing (SLP): Supervision   Problem: RH Problem Solving Goal: LTG Patient will demonstrate problem solving for (SLP) Description: LTG:  Patient will demonstrate problem solving for basic/complex daily situations with cues  (SLP) Flowsheets Taken 11/17/2023 1621 LTG Patient will demonstrate problem solving for: Supervision Taken 11/09/2023 1554 LTG: Patient will demonstrate problem solving for (SLP): Basic daily situations   Problem: RH Memory Goal: LTG Patient will use memory compensatory aids to (SLP) Description: LTG:  Patient will use memory compensatory aids to recall biographical/new, daily complex information with cues (SLP) Flowsheets (Taken 11/17/2023 1621) LTG: Patient will use memory compensatory aids to (SLP): Supervision   Problem: RH Awareness Goal: LTG: Patient will demonstrate awareness during functional activites type of (SLP) Description: LTG: Patient will demonstrate awareness during functional activites type of (SLP) Flowsheets Taken 11/17/2023 1621 LTG: Patient will demonstrate awareness during cognitive/linguistic activities with assistance of (SLP): Supervision Taken 11/09/2023 1554 Patient will demonstrate during cognitive/linguistic activities awareness type of: Intellectual

## 2023-11-17 NOTE — Plan of Care (Signed)
  Problem: Consults Goal: RH GENERAL PATIENT EDUCATION Description: See Patient Education module for education specifics. Outcome: Progressing Goal: Skin Care Protocol Initiated - if Braden Score 18 or less Description: If consults are not indicated, leave blank or document N/A Outcome: Progressing   Problem: RH BOWEL ELIMINATION Goal: RH STG MANAGE BOWEL WITH ASSISTANCE Description: STG Manage Bowel with min Assistance. Outcome: Progressing Goal: RH STG MANAGE BOWEL W/MEDICATION W/ASSISTANCE Description: STG Manage Bowel with Medication with min Assistance. Outcome: Progressing   Problem: RH BLADDER ELIMINATION Goal: RH STG MANAGE BLADDER WITH ASSISTANCE Description: STG Manage Bladder With min Assistance Outcome: Progressing   Problem: RH SKIN INTEGRITY Goal: RH STG SKIN FREE OF INFECTION/BREAKDOWN Description: MASD will improve and skin will be free of infection without additional breakdown with min assist  Outcome: Progressing Goal: RH STG MAINTAIN SKIN INTEGRITY WITH ASSISTANCE Description: STG Maintain Skin Integrity With min Assistance. Outcome: Progressing Goal: RH STG ABLE TO PERFORM INCISION/WOUND CARE W/ASSISTANCE Description: STG Able To Perform Incision/Wound Care With min Assistance. Outcome: Progressing   Problem: RH SAFETY Goal: RH STG ADHERE TO SAFETY PRECAUTIONS W/ASSISTANCE/DEVICE Description: STG Adhere to Safety Precautions With min Assistance/Device. Outcome: Progressing Goal: RH STG DECREASED RISK OF FALL WITH ASSISTANCE Description: STG Decreased Risk of Fall With cueing Assistance. Outcome: Progressing   Problem: RH PAIN MANAGEMENT Goal: RH STG PAIN MANAGED AT OR BELOW PT'S PAIN GOAL Description: Less than 2 with PRN medications min assist  Outcome: Progressing   Problem: RH KNOWLEDGE DEFICIT GENERAL Goal: RH STG INCREASE KNOWLEDGE OF SELF CARE AFTER HOSPITALIZATION Description: Patient/caregiver will be able to manage medications, self care,  diet/lifestyle modifications to improve overall health from nursing education, nursing handouts and other resources independently  Outcome: Progressing   Problem: Education: Goal: Ability to describe self-care measures that may prevent or decrease complications (Diabetes Survival Skills Education) will improve Outcome: Progressing Goal: Individualized Educational Video(s) Outcome: Progressing   Problem: Coping: Goal: Ability to adjust to condition or change in health will improve Outcome: Progressing   Problem: Fluid Volume: Goal: Ability to maintain a balanced intake and output will improve Outcome: Progressing   Problem: Health Behavior/Discharge Planning: Goal: Ability to identify and utilize available resources and services will improve Outcome: Progressing Goal: Ability to manage health-related needs will improve Outcome: Progressing   Problem: Metabolic: Goal: Ability to maintain appropriate glucose levels will improve Outcome: Progressing   Problem: Nutritional: Goal: Maintenance of adequate nutrition will improve Outcome: Progressing Goal: Progress toward achieving an optimal weight will improve Outcome: Progressing   Problem: Skin Integrity: Goal: Risk for impaired skin integrity will decrease Outcome: Progressing   Problem: Consults Goal: RH GENERAL PATIENT EDUCATION Description: See Patient Education module for education specifics. Outcome: Progressing

## 2023-11-18 DIAGNOSIS — G4700 Insomnia, unspecified: Secondary | ICD-10-CM

## 2023-11-18 DIAGNOSIS — E7220 Disorder of urea cycle metabolism, unspecified: Secondary | ICD-10-CM

## 2023-11-18 MED ORDER — MELATONIN 3 MG PO TABS: 3.0000 mg | ORAL_TABLET | Freq: Every evening | ORAL | Status: DC | PRN

## 2023-11-18 NOTE — Progress Notes (Signed)
Speech Language Pathology Daily Session Note  Patient Details  Name: Brian [name] MRN: [id number] Date of Birth: 1961-07-30  Today's Date: 11/18/2023 SLP Individual Time: 0807-0900 SLP Individual Time Calculation (min): 53 min  Short Term Goals: Week 2: SLP Short Term Goal 1 (Week 2): Patient will orient to date, time, situation, and person given external aids and supervision verbal cues. SLP Short Term Goal 2 (Week 2): Patient will utilize external and internal memory aids to recall daily information with supervision verbal cues. SLP Short Term Goal 3 (Week 2): Patient will state two cognitive and two physical impairments demonstrating intellectual awareness given min assist. SLP Short Term Goal 4 (Week 2): Patient will solve basic environmental problems in 4/5 opportunities given min assist.  Skilled Therapeutic Interventions: SLP conducted skilled therapy session targeting cognitive retraining goals. Patient oriented x4 with supervision, though required use of external aid to recall number of days in November and problem solve with min-mod assist to determine number of days remaining in length of stay. SLP targeted medication management this date with patient requiring min assist to recall purposes for medications. SLP provided patient with external aid via written list to assist to medication management and understanding. Patient continues to require mod assist to identify cognitive deficits and was overall max-total assist for emergent awareness during organization task, though emergent awareness not a direct goal. During organization task, patient benefited from overall min-mod cues to utilize list to answer basic medication questions and then followed 1-step directions pertaining to medication with supervision. Patient was left in chair with call bell in reach and chair alarm set. SLP will continue to target goals per plan of care.       Pain Pain Assessment Pain Scale: 0-10 Pain  Score: 0-No pain  Therapy/Group: Individual Therapy  Jeannie Done, M.A., CCC-SLP  [name] 11/18/2023, 8:27 AM

## 2023-11-18 NOTE — Progress Notes (Signed)
Physical Therapy Session Note  Patient Details  Name: Brian [name] MRN: [id number] Date of Birth: November 06, 1961  Today's Date: 11/18/2023 PT Individual Time: 1304-1403 PT Individual Time Calculation (min): 59 min   Short Term Goals: Week 1:  PT Short Term Goal 1 (Week 1): Pt will complete sit<>stand with mod assist to LRAD PT Short Term Goal 1 - Progress (Week 1): Met PT Short Term Goal 2 (Week 1): Pt will complete stand pivot transfer with LRAD and mod assist PT Short Term Goal 2 - Progress (Week 1): Met PT Short Term Goal 3 (Week 1): Pt will ambulate with LRAD 10' with mod assist and +2 for WC follow PT Short Term Goal 3 - Progress (Week 1): Met Week 2:  PT Short Term Goal 1 (Week 2): Pt will ambulate 18' with CGA with LRAD PT Short Term Goal 2 (Week 2): Pt will consistently complete transfers with CGA with LRAD PT Short Term Goal 3 (Week 2): Pt will complete up/down 5" curb with min assist with LRAD  Skilled Therapeutic Interventions/Progress Updates:  Patient seated upright in w/c on entrance to room. Patient alert and agreeable to PT session.   Patient with no pain complaint at start of session. Does relate increase of pain with WB to Bil feet, however is understanding that with reduction in swelling, pain is improving. Is hopeful that strength will improve as well.   Therapeutic Activity: Transfers: Pt performed sit<>stand and stand pivot transfers throughout session with MinA overall for power up. Requires up to [facility] for 2 instances but then does demos 2 instances of CGA with use of RW from w/c.  Provided vc for feet placement and increased hip hinge for anterior weight shift over foot placement.   Toilet transfer performed using STEDY per pt request. And is able to perform rise to stand with light MinA for initial power up from w/c and heavy minA from toilet. Is able to perform with sup from perch seat.Is continent of b/b and MaxA for pericare.   Neuromuscular Re-ed  Theract: NMR facilitated during session with focus on motor control and muscle fiber recruitment. Pt guided in standing toe taps to 2" step. With increased confidence in ability to place entire foot on step, pt is then able to progress to step up onto 2" step x4 with good effort and use of pressure and assist into BUE on RW. Confidence improves and willing to attempt taller step which he was not able to do earlier in day. Pt is then able to perform toe taps to 4" aerobic step and has confidence to attempt step up. Is able to complete 3 step ups with ease and then fatigue noted in 4th performance with reduced strength noted with significant increase in effort. Pt guided to sit for rest and allow BLE to rest d/t fatigue. Educated pt not to push self into performance with fatigue as this is usually when technique breaks down and compensatory movements are attempted and not always successful.  NMR performed for improvements in motor control and coordination, balance, sequencing, judgement, and self confidence/ efficacy in performing all aspects of mobility at highest level of independence.   Patient seated upright in w/c at end of session with brakes locked, seat pad alarm set, and all needs within reach.   Therapy Documentation Precautions:  Precautions Precautions: Fall Precaution Comments: incontinence Restrictions Weight Bearing Restrictions: No  Pain:  Minimal pain related this session with largest pain complaint at Bil feet with fatigue in BLE.  Therapy/Group: Individual Therapy  Loel Dubonnet PT, DPT, CSRS 11/18/2023, 3:01 PM

## 2023-11-18 NOTE — Plan of Care (Signed)
  Problem: Consults Goal: RH GENERAL PATIENT EDUCATION Description: See Patient Education module for education specifics. Outcome: Progressing Goal: Skin Care Protocol Initiated - if Braden Score 18 or less Description: If consults are not indicated, leave blank or document N/A Outcome: Progressing   Problem: RH BOWEL ELIMINATION Goal: RH STG MANAGE BOWEL WITH ASSISTANCE Description: STG Manage Bowel with min Assistance. Outcome: Not Progressing Goal: RH STG MANAGE BOWEL W/MEDICATION W/ASSISTANCE Description: STG Manage Bowel with Medication with min Assistance. Outcome: Progressing   Problem: RH BLADDER ELIMINATION Goal: RH STG MANAGE BLADDER WITH ASSISTANCE Description: STG Manage Bladder With min Assistance Outcome: Not Progressing   Problem: RH SKIN INTEGRITY Goal: RH STG SKIN FREE OF INFECTION/BREAKDOWN Description: MASD will improve and skin will be free of infection without additional breakdown with min assist  Outcome: Not Progressing Goal: RH STG MAINTAIN SKIN INTEGRITY WITH ASSISTANCE Description: STG Maintain Skin Integrity With min Assistance. Outcome: Progressing Goal: RH STG ABLE TO PERFORM INCISION/WOUND CARE W/ASSISTANCE Description: STG Able To Perform Incision/Wound Care With min Assistance. Outcome: Progressing   Problem: RH SAFETY Goal: RH STG ADHERE TO SAFETY PRECAUTIONS W/ASSISTANCE/DEVICE Description: STG Adhere to Safety Precautions With min Assistance/Device. Outcome: Progressing Goal: RH STG DECREASED RISK OF FALL WITH ASSISTANCE Description: STG Decreased Risk of Fall With cueing Assistance. Outcome: Progressing   Problem: RH PAIN MANAGEMENT Goal: RH STG PAIN MANAGED AT OR BELOW PT'S PAIN GOAL Description: Less than 2 with PRN medications min assist  Outcome: Progressing   Problem: RH KNOWLEDGE DEFICIT GENERAL Goal: RH STG INCREASE KNOWLEDGE OF SELF CARE AFTER HOSPITALIZATION Description: Patient/caregiver will be able to manage medications,  self care, diet/lifestyle modifications to improve overall health from nursing education, nursing handouts and other resources independently  Outcome: Progressing   Problem: Consults Goal: RH GENERAL PATIENT EDUCATION Description: See Patient Education module for education specifics. Outcome: Progressing   Problem: Education: Goal: Ability to describe self-care measures that may prevent or decrease complications (Diabetes Survival Skills Education) will improve Outcome: Progressing Goal: Individualized Educational Video(s) Outcome: Progressing   Problem: Coping: Goal: Ability to adjust to condition or change in health will improve Outcome: Progressing   Problem: Fluid Volume: Goal: Ability to maintain a balanced intake and output will improve Outcome: Progressing   Problem: Health Behavior/Discharge Planning: Goal: Ability to identify and utilize available resources and services will improve Outcome: Progressing Goal: Ability to manage health-related needs will improve Outcome: Progressing   Problem: Metabolic: Goal: Ability to maintain appropriate glucose levels will improve Outcome: Progressing   Problem: Nutritional: Goal: Maintenance of adequate nutrition will improve Outcome: Progressing Goal: Progress toward achieving an optimal weight will improve Outcome: Progressing   Problem: Skin Integrity: Goal: Risk for impaired skin integrity will decrease Outcome: Progressing   Problem: Tissue Perfusion: Goal: Adequacy of tissue perfusion will improve Outcome: Progressing

## 2023-11-18 NOTE — Progress Notes (Signed)
PROGRESS NOTE   Subjective/Complaints: No acute events noted overnight.  Patient does report poor sleep, not related to anxiety or pain.  ROS: + bowel and bladder incontinence -improving, generally at night.   Bilateral lower extremity swelling-improved with lymphedema wraps +insomnia- fluctuating   denies fevers, chills, N/V, abdominal pain, constipation, diarrhea, SOB, cough, chest pain, headache, new weakness or new sensory changes or paraesthesias.    Objective:   No results found. No results for input(s): "WBC", "HGB", "HCT", "PLT" in the last 72 hours.  No results for input(s): "NA", "K", "CL", "CO2", "GLUCOSE", "BUN", "CREATININE", "CALCIUM" in the last 72 hours.   Intake/Output Summary (Last 24 hours) at 11/18/2023 1521 Last data filed at 11/18/2023 1200 Gross per 24 hour  Intake 598 ml  Output 450 ml  Net 148 ml     Pressure Injury 11/08/23 Heel Right Deep Tissue Pressure Injury - Purple or maroon localized area of discolored intact skin or blood-filled blister due to damage of underlying soft tissue from pressure and/or shear. (Active)  11/08/23 1600  Location: Heel  Location Orientation: Right  Staging: Deep Tissue Pressure Injury - Purple or maroon localized area of discolored intact skin or blood-filled blister due to damage of underlying soft tissue from pressure and/or shear.  Wound Description (Comments):   Present on Admission: Yes    Physical Exam: Vital Signs Blood pressure (!) 130/57, pulse 81, temperature 98.4 F (36.9 C), temperature source Oral, resp. rate 17, height 5\' 11"  (1.803 m), weight 123.4 kg, SpO2 95%. Constitutional: No apparent distress. Appropriate appearance for age.  Morbidly obese.  Sitting in wheelchair HENT: PERRLA.  Atraumatic, normocephalic. + Goiter -not appreciated on exam Cardiovascular: RRR, no murmurs/rub/gallops. 2+ bilateral pitting and lymphedema from the knee  down,-wrapped bilaterally and Ace wraps.  Brisk capillary refill bilateral toes. Respiratory: CTAB. No rales, rhonchi, or wheezing.  Abdomen: + bowel sounds,  Mildly distended, no  tenderness.  Skin:  + Bilateral chronic lower extremity skin thickening and discolorations; obscured now by lymphedema wraps + Bilateral buttocks and coccygeal MASD-not examined + Right heel deep tissue injury, unstageable, with underlying bruise-obscured by lymphedema wraps; palpation of bilateral heels boggy but with thickened, callused overlying skin.    MSK:     No joint swelling noted   Neurologic exam:  Cognition: Alert and awake. Follows commands + Poor insight, memory deficit-significantly improved + Cognitive slowing-much improved  Strength: Antigravity and against resistance in bilateral upper extremities, 4 out of 5.  Bilateral lower extremities, now 4 out of 5 proximally and 5- out of 5 distally  Mood: Pleasant affect, appropriate mood.  Sensation: Equal and intact in BL UE and Les.  Coordination: Mild bilateral upper extremity ataxia on finger-to-nose  Assessment/Plan: 1. Functional deficits which require 3+ hours per day of interdisciplinary therapy in a comprehensive inpatient rehab setting. Physiatrist is providing close team supervision and 24 hour management of active medical problems listed below. Physiatrist and rehab team continue to assess barriers to discharge/monitor patient progress toward functional and medical goals  Care Tool:  Bathing    Body parts bathed by patient: Right arm, Left arm, Chest, Abdomen, Face   Body parts bathed by helper:  Front perineal area, Buttocks, Right upper leg, Left upper leg, Right lower leg, Left lower leg     Bathing assist Assist Level: Maximal Assistance - Patient 24 - 49%     Upper Body Dressing/Undressing Upper body dressing   What is the patient wearing?: Pull over shirt    Upper body assist Assist Level: Minimal Assistance -  Patient > 75%    Lower Body Dressing/Undressing Lower body dressing      What is the patient wearing?: Pants, Incontinence brief     Lower body assist Assist for lower body dressing: Total Assistance - Patient < 25%     Toileting Toileting    Toileting assist Assist for toileting: Dependent - Patient 0%     Transfers Chair/bed transfer  Transfers assist  Chair/bed transfer activity did not occur: Safety/medical concerns (Unsafet to get out of bed.)  Chair/bed transfer assist level: Contact Guard/Touching assist     Locomotion Ambulation   Ambulation assist   Ambulation activity did not occur: Safety/medical concerns  Assist level: Contact Guard/Touching assist Assistive device: Walker-rolling Max distance: 45'   Walk 10 feet activity   Assist  Walk 10 feet activity did not occur: Safety/medical concerns  Assist level: Contact Guard/Touching assist Assistive device: Walker-rolling   Walk 50 feet activity   Assist Walk 50 feet with 2 turns activity did not occur: Safety/medical concerns         Walk 150 feet activity   Assist Walk 150 feet activity did not occur: Safety/medical concerns         Walk 10 feet on uneven surface  activity   Assist Walk 10 feet on uneven surfaces activity did not occur: Safety/medical concerns         Wheelchair     Assist Is the patient using a wheelchair?: Yes Type of Wheelchair: Manual    Wheelchair assist level: Moderate Assistance - Patient 50 - 74% Max wheelchair distance: 70'    Wheelchair 50 feet with 2 turns activity    Assist        Assist Level: Moderate Assistance - Patient 50 - 74%   Wheelchair 150 feet activity     Assist      Assist Level: Maximal Assistance - Patient 25 - 49%   Blood pressure (!) 130/57, pulse 81, temperature 98.4 F (36.9 C), temperature source Oral, resp. rate 17, height 5\' 11"  (1.803 m), weight 123.4 kg, SpO2 95%.  Medical Problem List and  Plan: 1. Functional deficits secondary to acute encephalopathy, hepatic and possible metabolic component             -patient may shower             -ELOS/Goals: 10-14 days SPV - DC 11/28/23             - Stable to continue CIR  -11/26: Wife not returning calls per SLP. Remains Max A for LBD, min-mod transfers, walking 35 feet with RW Mod A with +2 WC follow. Min A for basic problem solving, Mod A for memory and awareness.   2.  Antithrombotics: -DVT/anticoagulation:  Pharmaceutical: Lovenox             -antiplatelet therapy: N/A 3.Left knee pain: XR ordered, voltaren gel ordered   - 11/21: No complaints today. 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  May benefit from hinged knee brace when weightbearing, or injection if medically stable.  Monitor today for therapy assessments.  - 11/22: With PT,  denied pain during session yesterday with ambulation and transfers. No adjustments at this time.   4. Mood/Behavior/Sleep: Provide emotional support             -antipsychotic agents: N/A  11-26: See below, applying overnight pulse ox to screen for OSA -approximately 4 events per hour, lowest desat in the 80s, not diagnostic of OSA.  Will not pursue aggressively water. -11/30 PRN melatonin   5. Neuropsych/cognition: This patient is capable of making decisions on his own behalf. 6. Skin/Wound Care: Routine skin checks..Excoriation of buttocks with wound care as directed -Low-air-loss mattress, Mepilex dressing to bilateral heels to prevent breakdown--add Prevalon boot RLE 11/22; never applied, re-ordered 11/27 bilaterally d/t boggy heels.  -Goodhart's Butt cream to prevent worsening MASD -Hydrocyn cream to bilateral legs for flaking, dry skin -11-23: Nursing order to increase cream to twice daily, over legs and feet, and to apply Ace wrapping from feet to mid shin to prevent worsening dependent edema. 11-24: Much improved appearance of bilateral lower  extremities, continue regimen of wrapping and hydrating cream. 11-26: Lymphedema wraps now on.  Concern regarding ongoing monitoring of right heel DTI with these wraps; prevalon boots as above.  11/27 discussed reason for Prevalon boot use   7. Fluids/Electrolytes/Nutrition: Routine in and outs with follow-up chemistries  -11-21: Sodium 133, repeat in a.m. for trend.  Otherwise labs stable.  11-22: Improved 134 today; next labs on Monday, monitor for cognitive changes  -Sodium up to 136 on 11/25  8.  Colitis versus ileitis-acute appendicitis ruled out.  Follow-up outpatient general surgery  11-24: Remains with frequent loose bowel movements overnight.  Lactulose held/as needed.  Will need to continue targeted bowel movements at least 2 to 3/day, however did add Imodium for excessive bowel movements.  No current concern for infection, vital stable and no white count.  11-25/26: Unchanged, ongoing.  Questional contribution of cognitive deficits.  Bowel movements do seem to be slowing down.  9.  Hypertension.  Blood pressure remains a bit soft remains on ProAmatine 10 mg 3 times daily as well as low-dose Coreg 12.5 mg twice daily.   - 11/21: Diastolic hypotension as below; resume midodrine 5 mg 2 times daily and wean as tolerated -stable 11-23  11/29 BP  well-controlled, continue current regimen.  Continue midodrine  11/30 BP controlled/stable, continue current      11/18/2023   12:44 PM 11/18/2023    7:39 AM 11/18/2023    3:47 AM  Vitals with BMI  Systolic 130 113 564  Diastolic 57 63 73  Pulse 81 81 81    10.  Chronic lymphedema.  Follow-up outpatient.  -PT order for lymphedema wrapping and assistance and education, management 11-22 -11-23: Per PT, only 1 PT in the hospital provide lymphedema management, looking into getting them in the room to assist in education this coming week -11/24 nursing providing Ace wrapping to bilateral lower extremities, does appear to be helping. 11/25  reports gradually improving, continue current regimen 11-26: PT Elray Mcgregor applied lymphedema wraps and educated patient on care 11-25; very much appreciate her help in this!  Patient comfortable, continue current management; PT to follow-up for tolerance and rewrap as needed and follow measurements until stable. 11-30 swelling in legs appears to be improving, continue current regimen  11.History of alcohol use.  Counseling 12.  Tobacco use.  Counseling 13.  Obesity.  BMI 37.08.  Dietary follow-up 14.  Prediabetes.  Hemoglobin A1c 5.8. d/c ISS and start metformin ER release with dinner,  creatinine reviewed and is stable -Blood sugars well-controlled, DC BG checks  11/25 glucose stable on BMP  15.  BPH/bladder incontinence/bowel incontinence.  Flomax 0.4 mg daily. BP reviewed and is stable   -11-23: DC Flomax, continue timed toileting and PVRs  11-24: PVRs remain low, remains continent during the day and incontinent at night.  Continue timed toileting.  11-26: Uncertain what barriers to continence are at this time, patient states that he sleeps through episodes of incontinence although he does feel the urge to go and knows to call the nurse for assistance.  Labs stable as above.  Will apply overnight pulse ox to assess for OSA/disordered sleep.  11-27: OSA evaluation nondiagnostic as above.  Incontinence that seems to be improving as bowel movements slow down.  Continue timed toileting and monitoring.  11/30 Occasional incontinence,  recheck PVR   16. Hyperammonemia.  Hepatic steatosis seen on intake CT.  - 11/21: Seems more encephalopathic today, last ammonia was increased from 40s to 60s, add on to a.m. labs and resume lactulose 7.5 mg twice daily if worsening  11-22: Ammonia within normal limits.  Continues having 4-5 stools per day.  Change lactulose to 10 mg twice daily as needed to target at least 3 stools per day.  Cognition does seem to be improving.  11-30 continue current dose  lactulose 10g BID PRN  17. Anemia.  hemoglobin decreased on intake from 12,6 to 10.7.  FOBT, H&H in AM. -11-22: Hemoglobin improved to 11.4, FOBT pending but no obvious blood in stool.  11/25 FOBT negative yesterday, hemoglobin 10.8 today-stable since 11/21  LOS: 10 days A FACE TO FACE EVALUATION WAS PERFORMED  Fanny Dance 11/18/2023, 3:21 PM

## 2023-11-18 NOTE — Progress Notes (Signed)
Physical Therapy Session Note  Patient Details  Name: Brian [name] MRN: [id number] Date of Birth: July 19, 1961  Today's Date: 11/18/2023 PT Individual Time: 1010-1120 PT Individual Time Calculation (min): 70 min   Short Term Goals: Week 2:  PT Short Term Goal 1 (Week 2): Pt will ambulate 57' with CGA with LRAD PT Short Term Goal 2 (Week 2): Pt will consistently complete transfers with CGA with LRAD PT Short Term Goal 3 (Week 2): Pt will complete up/down 5" curb with min assist with LRAD  Skilled Therapeutic Interventions/Progress Updates:    Pt presents in room in [facility], agreeable to PT, denies pain. Session focused on gait training for tolerance to upright, multidirectional stepping, and therapeutic exercise to promote BLE strengthening, single limb stability, muscular endurance, and power production to promote BLE strengthening.  Pt transported from room via WC dependently for time management. Pt completes sit<>stand from [facility] with CGA for immediate standing balance. Pt ambulates with RW CGA 45' from [facility] to mat, demonstrating good knee stability bilaterally, no buckling noted, cues for upright gaze.  Pt completes side stepping 3x6' R/L along edge of mat with RW, cues and visual demonstration for sequencing RW and side stepping, completed to promote BLE strengthening and multidirectional stepping stability.  Pt completes 2x5 sit<>stand from 20" mat with CGA/min assist, verbal cues for hand placement.  Pt ambulates back to WC 45' with RW CGA, slow gait speed, demonstrates slight knee instability with fatigue but no buckling noted, shorter step length and height.  Pt transported to day room and completes stand step transfer from [facility] to sit on kinetron. Pt completes interval training on kinetron 1 min work/74min rest for total 10 min at workload 30 cm/sec to promote gluteal strengthening, muscle fiber recruitment, and muscular endurance needed for functional transfers, gait, and stair  negotiation.  Pt completes transfer back to [facility] with CGA and RW. Pt completes therex to promote BLE strengthening and single limb stability including step taps to 3" step 2x10 BLE BUE support on RW. Pt requires seated rest break between lower extremities due to fatigue, second set pt requires immediate sit to [facility] with last rep secondary to fatigue.  Pt returns to room and remains seated in [facility] with all needs within reach, cal light in place and chair alarm donned and activated at end of session.  Therapy Documentation Precautions:  Precautions Precautions: Fall Precaution Comments: incontinence Restrictions Weight Bearing Restrictions: No   Therapy/Group: Individual Therapy  Edwin Cap PT, DPT 11/18/2023, 11:22 AM

## 2023-11-19 MED ORDER — TAMSULOSIN HCL 0.4 MG PO CAPS
0.4000 mg | ORAL_CAPSULE | Freq: Every day | ORAL | Status: DC
  Administered 2023-11-19 – 2023-11-27 (×9): 0.4 mg via ORAL
  Filled 2023-11-19 (×9): qty 1

## 2023-11-19 NOTE — Progress Notes (Signed)
PROGRESS NOTE   Subjective/Complaints: No acute events noted overnight.  Slept better last night.  He feels like edema in his legs is improving.  No new concerns.  ROS: + bowel incontinence-improved more frequently continent than not in the last few days  bladder incontinence -improving, generally at night.   Bilateral lower extremity swelling-improved with lymphedema wraps +insomnia- fluctuating   denies fevers, chills, N/V, abdominal pain, constipation, diarrhea, SOB, cough, chest pain, headache, new weakness or new sensory changes or paraesthesias.    Objective:   No results found. No results for input(s): "WBC", "HGB", "HCT", "PLT" in the last 72 hours.  No results for input(s): "NA", "K", "CL", "CO2", "GLUCOSE", "BUN", "CREATININE", "CALCIUM" in the last 72 hours.   Intake/Output Summary (Last 24 hours) at 11/19/2023 1142 Last data filed at 11/18/2023 1700 Gross per 24 hour  Intake 355 ml  Output 300 ml  Net 55 ml     Pressure Injury 11/08/23 Heel Right Deep Tissue Pressure Injury - Purple or maroon localized area of discolored intact skin or blood-filled blister due to damage of underlying soft tissue from pressure and/or shear. (Active)  11/08/23 1600  Location: Heel  Location Orientation: Right  Staging: Deep Tissue Pressure Injury - Purple or maroon localized area of discolored intact skin or blood-filled blister due to damage of underlying soft tissue from pressure and/or shear.  Wound Description (Comments):   Present on Admission: Yes    Physical Exam: Vital Signs Blood pressure 117/62, pulse 86, temperature 99.5 F (37.5 C), temperature source Oral, resp. rate 18, height 5\' 11"  (1.803 m), weight 123.2 kg, SpO2 92%. Constitutional: No apparent distress. Appropriate appearance for age.  Morbidly obese.  Lying in bed HENT: PERRLA.  Atraumatic, normocephalic. + Goiter -not appreciated on exam Cardiovascular:  RRR, no murmurs/rub/gallops. 2+ bilateral pitting and lymphedema from the knee down,-wrapped bilaterally and Ace wraps.  Brisk capillary refill bilateral toes.  Prevalon boots in place Respiratory: CTAB. No rales, rhonchi, or wheezing.  Abdomen: + bowel sounds,  Mildly distended, no  tenderness.  Skin:  + Bilateral chronic lower extremity skin thickening and discolorations; obscured now by lymphedema wraps + Bilateral buttocks and coccygeal MASD-not examined + Right heel deep tissue injury, unstageable, with underlying bruise-obscured by lymphedema wraps; palpation of bilateral heels boggy but with thickened, callused overlying skin.    MSK:     No joint swelling noted   Neurologic exam:  Cognition: Alert and awake. Follows commands + Poor insight, memory deficit-significantly improved + Cognitive slowing-much improved  Strength: Antigravity and against resistance in bilateral upper extremities, 4 out of 5.   RLE 4- to 4 out of 5 proximally and 5- out of 5 distally LLE 4 to 4 out of 5 proximally and 5- out of 5 distally  Mood: Pleasant affect, appropriate mood.  Sensation: Equal and intact in BL UE and Les.   Assessment/Plan: 1. Functional deficits which require 3+ hours per day of interdisciplinary therapy in a comprehensive inpatient rehab setting. Physiatrist is providing close team supervision and 24 hour management of active medical problems listed below. Physiatrist and rehab team continue to assess barriers to discharge/monitor patient progress toward functional and medical  goals  Care Tool:  Bathing    Body parts bathed by patient: Right arm, Left arm, Chest, Abdomen, Face   Body parts bathed by helper: Front perineal area, Buttocks, Right upper leg, Left upper leg, Right lower leg, Left lower leg     Bathing assist Assist Level: Maximal Assistance - Patient 24 - 49%     Upper Body Dressing/Undressing Upper body dressing   What is the patient wearing?: Pull over  shirt    Upper body assist Assist Level: Minimal Assistance - Patient > 75%    Lower Body Dressing/Undressing Lower body dressing      What is the patient wearing?: Pants, Incontinence brief     Lower body assist Assist for lower body dressing: Total Assistance - Patient < 25%     Toileting Toileting    Toileting assist Assist for toileting: Dependent - Patient 0%     Transfers Chair/bed transfer  Transfers assist  Chair/bed transfer activity did not occur: Safety/medical concerns (Unsafet to get out of bed.)  Chair/bed transfer assist level: Contact Guard/Touching assist     Locomotion Ambulation   Ambulation assist   Ambulation activity did not occur: Safety/medical concerns  Assist level: Contact Guard/Touching assist Assistive device: Walker-rolling Max distance: 45'   Walk 10 feet activity   Assist  Walk 10 feet activity did not occur: Safety/medical concerns  Assist level: Contact Guard/Touching assist Assistive device: Walker-rolling   Walk 50 feet activity   Assist Walk 50 feet with 2 turns activity did not occur: Safety/medical concerns         Walk 150 feet activity   Assist Walk 150 feet activity did not occur: Safety/medical concerns         Walk 10 feet on uneven surface  activity   Assist Walk 10 feet on uneven surfaces activity did not occur: Safety/medical concerns         Wheelchair     Assist Is the patient using a wheelchair?: Yes Type of Wheelchair: Manual    Wheelchair assist level: Moderate Assistance - Patient 50 - 74% Max wheelchair distance: 1'    Wheelchair 50 feet with 2 turns activity    Assist        Assist Level: Moderate Assistance - Patient 50 - 74%   Wheelchair 150 feet activity     Assist      Assist Level: Maximal Assistance - Patient 25 - 49%   Blood pressure 117/62, pulse 86, temperature 99.5 F (37.5 C), temperature source Oral, resp. rate 18, height 5\' 11"  (1.803  m), weight 123.2 kg, SpO2 92%.  Medical Problem List and Plan: 1. Functional deficits secondary to acute encephalopathy, hepatic and possible metabolic component             -patient may shower             -ELOS/Goals: 10-14 days SPV - DC 11/28/23             - Stable to continue CIR  -11/26: Wife not returning calls per SLP. Remains Max A for LBD, min-mod transfers, walking 35 feet with RW Mod A with +2 WC follow. Min A for basic problem solving, Mod A for memory and awareness.   2.  Antithrombotics: -DVT/anticoagulation:  Pharmaceutical: Lovenox             -antiplatelet therapy: N/A 3.Left knee pain: XR ordered, voltaren gel ordered   - 11/21: No complaints today. 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  May benefit from hinged knee brace when weightbearing, or injection if medically stable.  Monitor today for therapy assessments.  - 11/22: With PT, denied pain during session yesterday with ambulation and transfers. No adjustments at this time.   4. Mood/Behavior/Sleep: Provide emotional support             -antipsychotic agents: N/A  11-26: See below, applying overnight pulse ox to screen for OSA -approximately 4 events per hour, lowest desat in the 80s, not diagnostic of OSA.  Will not pursue aggressively water. -11/30 PRN melatonin  -12/1 sleeping better last night, continue current  5. Neuropsych/cognition: This patient is capable of making decisions on his own behalf. 6. Skin/Wound Care: Routine skin checks..Excoriation of buttocks with wound care as directed -Low-air-loss mattress, Mepilex dressing to bilateral heels to prevent breakdown--add Prevalon boot RLE 11/22; never applied, re-ordered 11/27 bilaterally d/t boggy heels.  -Goodhart's Butt cream to prevent worsening MASD -Hydrocyn cream to bilateral legs for flaking, dry skin -11-23: Nursing order to increase cream to twice daily, over legs and feet, and to apply Ace wrapping from  feet to mid shin to prevent worsening dependent edema. 11-24: Much improved appearance of bilateral lower extremities, continue regimen of wrapping and hydrating cream. 11-26: Lymphedema wraps now on.  Concern regarding ongoing monitoring of right heel DTI with these wraps; prevalon boots as above.  11/27 discussed reason for Prevalon boot use   7. Fluids/Electrolytes/Nutrition: Routine in and outs with follow-up chemistries  -11-21: Sodium 133, repeat in a.m. for trend.  Otherwise labs stable.  11-22: Improved 134 today; next labs on Monday, monitor for cognitive changes  -Sodium up to 136 on 11/25  -Recheck labs tomorrow   8.  Colitis versus ileitis-acute appendicitis ruled out.  Follow-up outpatient general surgery  11-24: Remains with frequent loose bowel movements overnight.  Lactulose held/as needed.  Will need to continue targeted bowel movements at least 2 to 3/day, however did add Imodium for excessive bowel movements.  No current concern for infection, vital stable and no white count.  11-25/26: Unchanged, ongoing.  Questional contribution of cognitive deficits.  Bowel movements do seem to be slowing down.  9.  Hypertension.  Blood pressure remains a bit soft remains on ProAmatine 10 mg 3 times daily as well as low-dose Coreg 12.5 mg twice daily.   - 11/21: Diastolic hypotension as below; resume midodrine 5 mg 2 times daily and wean as tolerated -stable 11-23  11/29 BP  well-controlled, continue current regimen.  Continue midodrine  12/1 BP controlled, continue current regimen     11/19/2023    5:08 AM 11/19/2023    3:23 AM 11/18/2023    7:28 PM  Vitals with BMI  Weight 271 lbs 10 oz    BMI 37.9    Systolic  117 135  Diastolic  62 68  Pulse  86 77    10.  Chronic lymphedema.  Follow-up outpatient.  -PT order for lymphedema wrapping and assistance and education, management 11-22 -11-23: Per PT, only 1 PT in the hospital provide lymphedema management, looking into getting  them in the room to assist in education this coming week -11/24 nursing providing Ace wrapping to bilateral lower extremities, does appear to be helping. 11/25 reports gradually improving, continue current regimen 11-26: PT Elray Mcgregor applied lymphedema wraps and educated patient on care 11-25; very much appreciate her help in this!  Patient comfortable, continue current management; PT to follow-up for tolerance and rewrap as needed and  follow measurements until stable. 12/1 PT reports improving, continue current regimen  11.History of alcohol use.  Counseling 12.  Tobacco use.  Counseling 13.  Obesity.  BMI 37.08.  Dietary follow-up 14.  Prediabetes.  Hemoglobin A1c 5.8. d/c ISS and start metformin ER release with dinner, creatinine reviewed and is stable -Blood sugars well-controlled, DC BG checks  11/25 glucose stable on BMP  15.  BPH/bladder incontinence/bowel incontinence.  Flomax 0.4 mg daily. BP reviewed and is stable   -11-23: DC Flomax, continue timed toileting and PVRs  11-24: PVRs remain low, remains continent during the day and incontinent at night.  Continue timed toileting.  11-26: Uncertain what barriers to continence are at this time, patient states that he sleeps through episodes of incontinence although he does feel the urge to go and knows to call the nurse for assistance.  Labs stable as above.  Will apply overnight pulse ox to assess for OSA/disordered sleep.  11-27: OSA evaluation nondiagnostic as above.  Incontinence that seems to be improving as bowel movements slow down.  Continue timed toileting and monitoring.  11/30 Occasional incontinence,  recheck PVR- 193, 220. Will retry flomax   16. Hyperammonemia.  Hepatic steatosis seen on intake CT.  - 11/21: Seems more encephalopathic today, last ammonia was increased from 40s to 60s, add on to a.m. labs and resume lactulose 7.5 mg twice daily if worsening  11-22: Ammonia within normal limits.  Continues having 4-5 stools  per day.  Change lactulose to 10 mg twice daily as needed to target at least 3 stools per day.  Cognition does seem to be improving.  11-30 continue current dose lactulose 10g BID PRN  17. Anemia.  hemoglobin decreased on intake from 12,6 to 10.7.  FOBT, H&H in AM. -11-22: Hemoglobin improved to 11.4, FOBT pending but no obvious blood in stool.  11/25 FOBT negative yesterday, hemoglobin 10.8 today-stable since 11/21  LOS: 11 days A FACE TO FACE EVALUATION WAS PERFORMED  Fanny Dance 11/19/2023, 11:42 AM

## 2023-11-20 LAB — COMPREHENSIVE METABOLIC PANEL
ALT: 22 U/L (ref 0–44)
AST: 36 U/L (ref 15–41)
Albumin: 2.2 g/dL — ABNORMAL LOW (ref 3.5–5.0)
Alkaline Phosphatase: 99 U/L (ref 38–126)
Anion gap: 6 (ref 5–15)
BUN: 10 mg/dL (ref 6–20)
CO2: 25 mmol/L (ref 22–32)
Calcium: 8.9 mg/dL (ref 8.9–10.3)
Chloride: 108 mmol/L (ref 98–111)
Creatinine, Ser: 0.47 mg/dL — ABNORMAL LOW (ref 0.61–1.24)
GFR, Estimated: >60 mL/min (ref >60)
Glucose, Bld: 108 mg/dL — ABNORMAL HIGH (ref 70–99)
Potassium: 4 mmol/L (ref 3.5–5.1)
Sodium: 139 mmol/L (ref 135–145)
Total Bilirubin: 3.4 mg/dL — ABNORMAL HIGH (ref <1.2)
Total Protein: 6.7 g/dL (ref 6.5–8.1)

## 2023-11-20 LAB — CBC WITH DIFFERENTIAL/PLATELET
Abs Immature Granulocytes: 0.01 10*3/uL (ref 0.00–0.07)
Basophils Absolute: 0 10*3/uL (ref 0.0–0.1)
Basophils Relative: 1 %
Eosinophils Absolute: 0.3 10*3/uL (ref 0.0–0.5)
Eosinophils Relative: 6 %
HCT: 31 % — ABNORMAL LOW (ref 39.0–52.0)
Hemoglobin: 10.4 g/dL — ABNORMAL LOW (ref 13.0–17.0)
Immature Granulocytes: 0 %
Lymphocytes Relative: 19 %
Lymphs Abs: 0.8 10*3/uL (ref 0.7–4.0)
MCH: 33.5 pg (ref 26.0–34.0)
MCHC: 33.5 g/dL (ref 30.0–36.0)
MCV: 100 fL (ref 80.0–100.0)
Monocytes Absolute: 0.5 10*3/uL (ref 0.1–1.0)
Monocytes Relative: 12 %
Neutro Abs: 2.6 10*3/uL (ref 1.7–7.7)
Neutrophils Relative %: 62 %
Platelets: 169 10*3/uL (ref 150–400)
RBC: 3.1 MIL/uL — ABNORMAL LOW (ref 4.22–5.81)
RDW: 14.9 % (ref 11.5–15.5)
WBC: 4.2 10*3/uL (ref 4.0–10.5)
nRBC: 0 % (ref 0.0–0.2)

## 2023-11-20 MED ORDER — MIDODRINE HCL 5 MG PO TABS
2.5000 mg | ORAL_TABLET | Freq: Two times a day (BID) | ORAL | Status: DC
  Administered 2023-11-20 – 2023-11-21 (×3): 2.5 mg via ORAL
  Filled 2023-11-20 (×3): qty 1

## 2023-11-20 NOTE — Progress Notes (Signed)
PROGRESS NOTE   Subjective/Complaints: No acute complaints.  No events overnight.  Patient sitting up with therapies this a.m., about to take a shower, inquiring about need for covering; plan for PT to rewrap lymphedema wraps after shower.  Bowel movement 12-1: Liquid.  Appears once per day.  All bowel movements remain incontinent. Bladder is mixed, last bladder scans in the mid 100s. Vitals overall stable.  ROS: + bowel incontinence, -ongoing  bladder incontinence -ongoing   Bilateral lower extremity swelling-improved with lymphedema wraps +insomnia-resolved  denies fevers, chills, N/V, abdominal pain, constipation, , SOB, cough, chest pain, headache, new weakness or new sensory changes or paraesthesias.    Objective:   No results found. No results for input(s): "WBC", "HGB", "HCT", "PLT" in the last 72 hours.  No results for input(s): "NA", "K", "CL", "CO2", "GLUCOSE", "BUN", "CREATININE", "CALCIUM" in the last 72 hours.   Intake/Output Summary (Last 24 hours) at 11/20/2023 0736 Last data filed at 11/19/2023 1800 Gross per 24 hour  Intake 236 ml  Output 900 ml  Net -664 ml     Pressure Injury 11/08/23 Heel Right Deep Tissue Pressure Injury - Purple or maroon localized area of discolored intact skin or blood-filled blister due to damage of underlying soft tissue from pressure and/or shear. (Active)  11/08/23 1600  Location: Heel  Location Orientation: Right  Staging: Deep Tissue Pressure Injury - Purple or maroon localized area of discolored intact skin or blood-filled blister due to damage of underlying soft tissue from pressure and/or shear.  Wound Description (Comments):   Present on Admission: Yes    Physical Exam: Vital Signs Blood pressure 134/67, pulse 83, temperature 99.3 F (37.4 C), temperature source Oral, resp. rate 18, height 5\' 11"  (1.803 m), weight 123.1 kg, SpO2 94%.  Constitutional: No apparent  distress. Appropriate appearance for age.  Morbidly obese.  Sitting upright at edge of bed. HENT: PERRLA.  Atraumatic, normocephalic.  Cardiovascular: RRR, no murmurs/rub/gallops. 2+ bilateral pitting and lymphedema -wrapped bilaterally.  Unchanged. Respiratory: CTAB. No rales, rhonchi, or wheezing.  Abdomen: + bowel sounds,  Mildly distended, no  tenderness.  Unchanged. Skin:  + Bilateral chronic lower extremity skin thickening and discolorations; obscured now by lymphedema wraps + Bilateral buttocks and coccygeal MASD-not examined + Right heel deep tissue injury, unstageable, with underlying bruise-obscured by lymphedema wraps  MSK:     No joint swelling noted  Neurologic exam:  Cognition: Alert and awake.  Oriented x 3.  Follows commands. + Cognitive slowing-ongoing but with significant improvement since admission  Strength: Antigravity and against resistance in all 4 extremities, 5- out of 5 throughout Mood: Pleasant affect, appropriate mood.  Sensation: Equal and intact in BL UE and Les.   Assessment/Plan: 1. Functional deficits which require 3+ hours per day of interdisciplinary therapy in a comprehensive inpatient rehab setting. Physiatrist is providing close team supervision and 24 hour management of active medical problems listed below. Physiatrist and rehab team continue to assess barriers to discharge/monitor patient progress toward functional and medical goals  Care Tool:  Bathing    Body parts bathed by patient: Right arm, Left arm, Chest, Abdomen, Face   Body parts bathed by helper: Front perineal  area, Buttocks, Right upper leg, Left upper leg, Right lower leg, Left lower leg     Bathing assist Assist Level: Maximal Assistance - Patient 24 - 49%     Upper Body Dressing/Undressing Upper body dressing   What is the patient wearing?: Pull over shirt    Upper body assist Assist Level: Minimal Assistance - Patient > 75%    Lower Body Dressing/Undressing Lower  body dressing      What is the patient wearing?: Pants, Incontinence brief     Lower body assist Assist for lower body dressing: Total Assistance - Patient < 25%     Toileting Toileting    Toileting assist Assist for toileting: Dependent - Patient 0%     Transfers Chair/bed transfer  Transfers assist  Chair/bed transfer activity did not occur: Safety/medical concerns (Unsafet to get out of bed.)  Chair/bed transfer assist level: Contact Guard/Touching assist     Locomotion Ambulation   Ambulation assist   Ambulation activity did not occur: Safety/medical concerns  Assist level: Contact Guard/Touching assist Assistive device: Walker-rolling Max distance: 45'   Walk 10 feet activity   Assist  Walk 10 feet activity did not occur: Safety/medical concerns  Assist level: Contact Guard/Touching assist Assistive device: Walker-rolling   Walk 50 feet activity   Assist Walk 50 feet with 2 turns activity did not occur: Safety/medical concerns         Walk 150 feet activity   Assist Walk 150 feet activity did not occur: Safety/medical concerns         Walk 10 feet on uneven surface  activity   Assist Walk 10 feet on uneven surfaces activity did not occur: Safety/medical concerns         Wheelchair     Assist Is the patient using a wheelchair?: Yes Type of Wheelchair: Manual    Wheelchair assist level: Moderate Assistance - Patient 50 - 74% Max wheelchair distance: 65'    Wheelchair 50 feet with 2 turns activity    Assist        Assist Level: Moderate Assistance - Patient 50 - 74%   Wheelchair 150 feet activity     Assist      Assist Level: Maximal Assistance - Patient 25 - 49%   Blood pressure 134/67, pulse 83, temperature 99.3 F (37.4 C), temperature source Oral, resp. rate 18, height 5\' 11"  (1.803 m), weight 123.1 kg, SpO2 94%.  Medical Problem List and Plan: 1. Functional deficits secondary to acute  encephalopathy, hepatic and possible metabolic component             -patient may shower             -ELOS/Goals: 10-14 days SPV - DC 11/28/23             - Stable to continue CIR  -11/26: Wife not returning calls per SLP. Remains Max A for LBD, min-mod transfers, walking 35 feet with RW Mod A with +2 WC follow. Min A for basic problem solving, Mod A for memory and awareness.   2.  Antithrombotics: -DVT/anticoagulation:  Pharmaceutical: Lovenox             -antiplatelet therapy: N/A 3.Left knee pain: XR ordered, voltaren gel ordered   - 11/21: No complaints today. 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  May benefit from hinged knee brace when weightbearing, or injection if medically stable.  Monitor today for therapy assessments.  - 11/22: With PT, denied pain during  session yesterday with ambulation and transfers. No adjustments at this time.   4. Mood/Behavior/Sleep: Provide emotional support             -antipsychotic agents: N/A  11-26: See below, applying overnight pulse ox to screen for OSA -approximately 4 events per hour, lowest desat in the 80s, not diagnostic of OSA.  Will not pursue aggressively water. -11/30 PRN melatonin  -12/1 sleeping better last night, continue current  5. Neuropsych/cognition: This patient is capable of making decisions on his own behalf.  6. Skin/Wound Care: Routine skin checks..Excoriation of buttocks with wound care as directed -Low-air-loss mattress, Mepilex dressing to bilateral heels to prevent breakdown--add Prevalon boot RLE 11/22; never applied, re-ordered 11/27 bilaterally d/t boggy heels  -Goodhart's Butt cream to prevent worsening MASD -Hydrocyn cream to bilateral legs for flaking, dry skin -11-23: Nursing order to increase cream to twice daily, over legs and feet, and to apply Ace wrapping from feet to mid shin to prevent worsening dependent edema. 11-24: Much improved appearance of bilateral lower  extremities, continue regimen of wrapping and hydrating cream. 11-26: Lymphedema wraps now on.  Concern regarding ongoing monitoring of right heel DTI with these wraps; prevalon boots as above.  11/27 discussed reason for Prevalon boot use-patient has been compliant     7. Fluids/Electrolytes/Nutrition: Routine in and outs with follow-up chemistries  -11-21: Sodium 133, repeat in a.m. for trend.  Otherwise labs stable.  11-22: Improved 134 today; next labs on Monday, monitor for cognitive changes  -Sodium up to 136 on 11/25  -12-2: Sodium 139; other labs stable.  P.o. intake adequate.  Monitor.  8.  Colitis versus ileitis-acute appendicitis ruled out.  Follow-up outpatient general surgery  11-24: Remains with frequent loose bowel movements overnight.  Lactulose held/as needed.  Will need to continue targeted bowel movements at least 2 to 3/day, however did add Imodium for excessive bowel movements.  No current concern for infection, vital stable and no white count.  11-25/26: Unchanged, ongoing.  Questional contribution of cognitive deficits.  Bowel movements do seem to be slowing down.  12-2: Once daily liquid to soft bowel movements, remain incontinent  9.  Hypertension/hypotension.  Blood pressure remains a bit soft remains on ProAmatine 10 mg 3 times daily as well as low-dose Coreg 12.5 mg twice daily.   - 11/21: Diastolic hypotension as below; resume midodrine 5 mg 2 times daily and wean as tolerated -stable 11-23  11/29 BP  well-controlled, continue current regimen.  Continue midodrine  12/1 BP controlled, continue current regimen  12-2: Has remained normotensive in mid 30s; decrease midodrine to 2.5 mg twice daily, monitor 1 to 2 days before discontinuing     11/20/2023    3:52 AM 11/19/2023    7:15 PM 11/19/2023    2:26 PM  Vitals with BMI  Weight 271 lbs 6 oz    BMI 37.87    Systolic 134 139 324  Diastolic 67 81 74  Pulse 83 75 78    10.  Chronic lymphedema.  Follow-up  outpatient.  -PT order for lymphedema wrapping and assistance and education, management 11-22 -11-23: Per PT, only 1 PT in the hospital provide lymphedema management, looking into getting them in the room to assist in education this coming week -11/24 nursing providing Ace wrapping to bilateral lower extremities, does appear to be helping. 11/25 reports gradually improving, continue current regimen 11-26: PT Elray Mcgregor applied lymphedema wraps and educated patient on care 11-25; very much appreciate her help in  this!  Patient comfortable, continue current management; PT to follow-up for tolerance and rewrap as needed and follow measurements until stable. 12/1 PT reports improving, continue current regimen -lymphedema wraps to be redone 12-2  11.History of alcohol use.  Counseling 12.  Tobacco use.  Counseling 13.  Obesity.  BMI 37.08.  Dietary follow-up 14.  Prediabetes.  Hemoglobin A1c 5.8. d/c ISS and start metformin ER release with dinner, creatinine reviewed and is stable -Blood sugars well-controlled, DC BG checks  11/25 glucose stable on BMP  15.  BPH/bladder incontinence/bowel incontinence.  Flomax 0.4 mg daily. BP reviewed and is stable   -11-23: DC Flomax, continue timed toileting and PVRs  11-24: PVRs remain low, remains continent during the day and incontinent at night.  Continue timed toileting.  11-26: Uncertain what barriers to continence are at this time, patient states that he sleeps through episodes of incontinence although he does feel the urge to go and knows to call the nurse for assistance.  Labs stable as above.  Will apply overnight pulse ox to assess for OSA/disordered sleep.  11-27: OSA evaluation nondiagnostic as above.  Incontinence that seems to be improving as bowel movements slow down.  Continue timed toileting and monitoring.  11/30 Occasional incontinence,  recheck PVR- 193, 220. Will retry flomax  12-2: Ongoing incontinence, PVRs mid 100s.  Continue Flomax    16. Hyperammonemia.  Hepatic steatosis seen on intake CT.  - 11/21: Seems more encephalopathic today, last ammonia was increased from 40s to 60s, add on to a.m. labs and resume lactulose 7.5 mg twice daily if worsening  11-22: Ammonia within normal limits.  Continues having 4-5 stools per day.  Change lactulose to 10 mg twice daily as needed to target at least 3 stools per day.  Cognition does seem to be improving.  11-30 continue current dose lactulose 10g BID PRN  17. Anemia.  hemoglobin decreased on intake from 12,6 to 10.7.  FOBT, H&H in AM. -11-22: Hemoglobin improved to 11.4, FOBT pending but no obvious blood in stool. FOBT negative  Hemoglobin stable 12-2.  LOS: 12 days A FACE TO FACE EVALUATION WAS PERFORMED  Angelina Sheriff 11/20/2023, 7:36 AM

## 2023-11-20 NOTE — Progress Notes (Signed)
Speech Language Pathology Daily Session Note  Patient Details  Name: Brian [name] MRN: [id number] Date of Birth: Oct 09, 1961  Today's Date: 11/20/2023 SLP Individual Time: 4098-1191 SLP Individual Time Calculation (min): 43 min  Short Term Goals: Week 2: SLP Short Term Goal 1 (Week 2): Patient will orient to date, time, situation, and person given external aids and supervision verbal cues. SLP Short Term Goal 2 (Week 2): Patient will utilize external and internal memory aids to recall daily information with supervision verbal cues. SLP Short Term Goal 3 (Week 2): Patient will state two cognitive and two physical impairments demonstrating intellectual awareness given min assist. SLP Short Term Goal 4 (Week 2): Patient will solve basic environmental problems in 4/5 opportunities given min assist.  Skilled Therapeutic Interventions: SLP conducted skilled therapy session targeting cognitive retraining goals. SLP guided patient through basic money math with patient adding money together when presented with coins and bills. Patient benefited from min assist to accurately complete. Patient recalls recent events with supervision verbal cues and is oriented x4 with supervision. Re: awareness, patient requires min-mod assist to state lingering cognitive deficits, though is supervision-min assist to state physical deficits. Patient is oriented to date independently of external aids, nonetheless, SLP updated external aids to reflect change in calendar month. Patient was left in lowered bed with call bell in reach and bed alarm set. SLP will continue to target goals per plan of care.       Pain Pain Assessment Pain Scale: 0-10 Pain Score: 0-No pain  Therapy/Group: Individual Therapy  Jeannie Done, M.A., CCC-SLP  [name] 11/20/2023, 1:16 PM

## 2023-11-20 NOTE — Progress Notes (Signed)
Physical Therapy Session Note  Patient Details  Name: Brian [name] MRN: [id number] Date of Birth: 09-14-1961  Today's Date: 11/20/2023 PT Individual Time: 1005-1045 PT Individual Time Calculation (min): 40 min   Short Term Goals: Week 2:  PT Short Term Goal 1 (Week 2): Pt will ambulate 25' with CGA with LRAD PT Short Term Goal 2 (Week 2): Pt will consistently complete transfers with CGA with LRAD PT Short Term Goal 3 (Week 2): Pt will complete up/down 5" curb with min assist with LRAD  Skilled Therapeutic Interventions/Progress Updates:    Pt presents in room in [facility], agreeable to PT. Pt denies pain. Session focused on gait training for tolerance to upright and dynamic standing balance, therapeutic exercise to promote BLE/BUE strengthening needed for functional transfers, gait, and stair negotiation, and therapeutic activities to manage LE edema.  Pt presents without lymphedema wraps applied from shower earlier this AM. Therapist loosely acewraps BLEs to decrease edema prior to receiving follow up lymphedema wrapping. Pt transported from room to main gym dependently for time management.  Pt ambulates 2x45' with RW CGA, short rest break between trials, pt demonstrating good knee stability throughout task.  Pt completes x10 step taps 4" step BLE, alternating BLE step taps 4" step x20. Pt completes x8 step ups to 4" step with BUE support on RW. Pt then completes step ups 2x3 reps to 3" step with BHRs for support, pt requires min assist for task due to BLE fatigue, demonstrates knee buckling when ascending leading with LLE, improved knee stability when ascending with RLE.  Pt then completes standing balance task placing RW on 5" curb 2x5 reps, to promote standing balance and tolerance needed to complete up/down 5" curb step to simulate home set up. Completes with CGA/min assist for postural stability, cues for BLE foot placement.  Pt returns to room and remains seated in [facility] with all needs  within reach, cal light in place and chair alarm donned and activated at end of session.  Therapy Documentation Precautions:  Precautions Precautions: Fall Precaution Comments: incontinence Restrictions Weight Bearing Restrictions: No   Therapy/Group: Individual Therapy  Edwin Cap PT, DPT 11/20/2023, 12:20 PM

## 2023-11-20 NOTE — Plan of Care (Signed)
  Problem: Tissue Perfusion: Goal: Adequacy of tissue perfusion will improve Outcome: Progressing   

## 2023-11-20 NOTE — Progress Notes (Signed)
Physical Therapy Session Note  Patient Details  Name: Brian [name] MRN: [id number] Date of Birth: 09-02-1961  Today's Date: 11/20/2023 PT Individual Time: 4098-1191 PT Individual Time Calculation (min): 53 min   Short Term Goals: Week 2:  PT Short Term Goal 1 (Week 2): Pt will ambulate 22' with CGA with LRAD PT Short Term Goal 2 (Week 2): Pt will consistently complete transfers with CGA with LRAD PT Short Term Goal 3 (Week 2): Pt will complete up/down 5" curb with min assist with LRAD  Skilled Therapeutic Interventions/Progress Updates:    Patient seen for lymphedema management with measurement and wrapping of both lower legs.  Patient in bed with legs wrapped with ACE wraps and legs elevated.  Patient relates had showered with OT earlier.  After removal of ACE wraps measured both legs and replaced wraps.  Measurements as below:                                                   L                                              R               Great Toe                    11.5 cm                                    12.6 cm             MTP's                          28.7 cm                                    29.5 cm             10 cm up from MTP    32.5 cm                                    31.8 cm             At malleoli                   32.7 cm                                    33.0 cm                        10 cm up from mall     26.7 cm                                    30.5 cm  10 cm up                     36.5 cm                                   40.5 cm                        10 cm up                     45.0 cm                                   47.8 cm  Likely fluctuation in measurements due to wraps removed in AM and wrapped in PM after therapies.  Noted d/c date set for 12/10.  Plan to change wraps again Wednesday and consider measuring for stockings if little changes.  Otherwise may need to figure out outpatient follow up. Consider podiatry consult for toenail  management as well.   Therapy Documentation Precautions:  Precautions Precautions: Fall Precaution Comments: incontinence Restrictions Weight Bearing Restrictions: No    Therapy/Group: Individual Therapy  Elray Mcgregor 11/20/2023, 6:11 PM  Sheran Lawless, PT Acute Rehabilitation Services Office:[phone] 11/20/2023

## 2023-11-20 NOTE — Progress Notes (Signed)
Occupational Therapy Session Note  Patient Details  Name: Brian [name] MRN: [id number] Date of Birth: April 14, 1961  Today's Date: 11/20/2023 OT Individual Time: 0805-0900 & 1420-1530 OT Individual Time Calculation (min): 55 min & 70 min   Short Term Goals: Week 2:  OT Short Term Goal 1 (Week 2): Patient will complete 1 step of toileting task OT Short Term Goal 2 (Week 2): Patient will complete toilet transfer with CGA OT Short Term Goal 3 (Week 2): Patient will maintain standing at the sink for 3 minutes in preparation for BADL tasks.  Skilled Therapeutic Interventions/Progress Updates:  Session 1 Skilled OT intervention completed with focus on ADL retraining, functional endurance, and mobility within a shower context. Pt received upright in bed, agreeable to session. No pain reported.  Pt agreeable to shower, as per lymphedema PT (cynthia), she plans to re-measure/re-wrap BLE for edema management this date after pt had chance to clean his skin via shower.  Doffed prevalon boots total A. Transitioned to EOB with heavy dependence on bed rail but overall supervision. Utilized STEDY for transfers for time management, with pt able to stand with CGA/supervision from elevated heights and min A from lower surfaces/with fatigue then dependent transfer in STEDY > BSC over toilet > BSC in shower > w/c. Cues needed for sequencing and to descend with control on stand > sit.  Pt continent of urinary void and loose BM on commode. Required max A for toileting steps in STEDY.   Seated on BSC in shower, OT removed all lymphedema wraps with total A with skin remaining intact. PT/team notified of status and awaiting re-wrapping. Per nursing, DTI to the Rt heel ok to get washed with warm water and soap as inspection is planned while uncovered.  Pt was able to bathe all parts with min A for feet only, would benefit from LH sponge on next trial for increased independence. Bathing completed seated only with  hole of the BSC used to wash buttocks. Cues needed for sequencing and thoroughness however pt with great effort to complete tasks as independently as possible. STEDY transfer > w/c.   Able to donn shirt/deo with set up A. Threaded LB clothing with total A for time at the sit > stand level with STEDY. Applied derma cream to BLE for skin integrity, then donned socks with total A for time.  MD present for rounds. Pt remained seated in w/c, with chair alarm on/activated, and with all needs in reach at end of session.  Session 2 Skilled OT intervention completed with focus on toileting needs, cognition, cardiovascular/BUE endurance. Pt received seated in w/c, agreeable to session. No pain reported.  Offered toileting at start of session. Utilized STEDY for time management. Pt able to stand in STEDY with supervision, then dependent transfer in perched position from w/c <> BSC over toilet. Pt continent of loose BM only. Pt was able to manage 3/3 toileting steps with min A only for securing brief over hips prior to pants. In perched position in STEDY pt completed oral care with supervision.  Pt self-propelled in w/c > gym with supervision with 2 short breaks for fatigue, with min A needed for navigating through doorway.  Pt participated in the following activities in sitting to address cognitive strategies needed for independence and safety with BADL management: -Motor speed dot test- 2.26 sec, does not indicate a delay in motor processing -Bell cancellation test- 5 min; 0 misses, no difficulty -Trail Making A- 1:20 min, 2 errors -Trail Making B- 6  min, 5 errors with min A needed to correct mistakes; demonstrates mild cognitive deficit  Pt completed the following intervals while seated at the SCIFIT to promote global/BUE endurance needed for independence with BADLs and functional transfers: -3 min, level 7, forwards -3 min, level 5, forwards -3 min, level 7, backwards Intermittent rest breaks needed  for fatigue  Transported dependently in w/c back to room for fatigue, then CGA sit > stand and stand pivot using RW > EOB! Min A needed for BLE into supine position. Donned prevalon boots to BLE for pressure relief. Pt remained semi upright with BLE elevated, with bed alarm on/activated, and with all needs in reach at end of session.   Therapy Documentation Precautions:  Precautions Precautions: Fall Precaution Comments: incontinence Restrictions Weight Bearing Restrictions: No    Therapy/Group: Individual Therapy  Melvyn Novas, MS, OTR/L  11/20/2023, 3:33 PM

## 2023-11-21 DIAGNOSIS — R4189 Other symptoms and signs involving cognitive functions and awareness: Secondary | ICD-10-CM

## 2023-11-21 NOTE — Consult Note (Signed)
Neuropsychological Consultation Comprehensive Inpatient Rehab   Patient:   Brian [name]   DOB:   07-26-1961  MR Number:  [id number]  Location:  [facility] [address] [place] [address] Dept: [phone] Loc: [phone]           Date of Service:   11/20/2020  Start Time:   1 PM End Time:   2 PM  Provider/Observer:  Arley Phenix, Psy.D.       Clinical Neuropsychologist       Billing Code/Service: [phone]  Reason for Service:    Brian [name] is a 62 year old male referred for neuropsychological consultation during his ongoing admission onto the comprehensive inpatient rehabilitation unit.  Patient has a past medical history including obesity, fatty liver, alcohol abuse with alcoholic pancreatitis, chronic lymphedema and hypertensive status as well as tobacco use.  Patient was having mobility issues prior to admission and was able to ambulate short distances around the house with rolling walker.  Patient presented on 10/30/2023 after being found by his wife on the couch and altered mental status.  Patient was noted to be tachycardic by EMS.  CT/MRI showed no acute reversible findings.  Other issues were assessed for possible causative factors and no definitive acute findings were established.  Patient was on empiric antibiotics for short time.  Patient and renal failure.  After evaluations were completed due to functional deficits around mobility and acute encephalopathy patient was admitted to the CIR program for rehab efforts.  During today's clinical interview patient admitted to progressively worsening physical status and is unsure what was happening but knew that he had been very sick.  Patient mitts to being extremely weak etc.  Patient denied significant issues with depression and no worsening of the symptoms during his current admission and reports that his mood is improved with improved  functioning.  Patient notes motivation to improve his overall strength and functional mobility.  Patient was oriented but slow to cognition noted.  Expressive and receptive language capacities were intact.  HPI for the current admission:    HPI: Brian [name] is a 62 year old right-handed male with history of obesity BMI 38.8, fatty liver, alcohol abuse with alcoholic pancreatitis,chronic lymphedema and hypertension as well as tobacco use. Per chart review patient lives with spouse. Two-level home bed and bath on main level with one-step to entry. Patient was able to ambulate short distances around the house with a rolling walker sometimes needed. Wheelchair for community distances. Independent with basic ADLs. Presented 10/30/2023 after he was found by his wife on the couch in his own urine and feces with altered mental status. With EMS he was noted to be tachycardic and warm to touch. His oxygen saturation are 87%. He did receive 4 mg of IV Ativan due to concerns for possible DTs. CT/MRI of the brain showed no acute reversible findings. CT of the chest abdomen pelvis findings concerning for potential acute appendicitis given the dilated inflamed appearing appendix containing multiple appendicoliths. Diffuse dilatation of the small bowel and colon suggesting possible ileus. No definite acute findings in the thorax. Hepatomegaly with severe hepatic steatosis. 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 Chemistries blood cultures no growth to date, potassium 3.0, glucose 152, BUN 25, AST 44, total bilirubin 9.4, troponin negative, lipase 35, alcohol negative, hemoglobin A1c 5.8, ammonia level 42, lactic acid 3.8-0.8. General surgery was consulted for low suspicion of  appendicitis and advised to continue to monitor. He did remain on empiric antibiotics for short time. Placed on lactulose for elevated ammonia levels and continued to be monitored. Blood  pressures remained a bit soft maintained on ProAmatine. Lovenox added for DVT prophylaxis. Mechanical soft diet with thin liquids. Therapy evaluations completed due to patient decreased functional mobility/acute encephalopathy was admitted for a comprehensive rehab program.   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 his dress was Appropriate and he was Well Groomed and his manners were Appropriate to the situation.  his participation was indicative of Appropriate behaviors.  There were physical disabilities noted.  he displayed an appropriate level of cooperation and motivation.    Interactions:    Active Appropriate and Redirectable  Attention:   abnormal and attention span appeared shorter than expected for age  Memory:   within normal limits; recent and remote memory intact  Visuo-spatial:   not examined  Speech (Volume):  low  Speech:   normal; normal  Thought Process:  Coherent and Relevant  Coherent  Though Content:  WNL; not suicidal and not homicidal  Orientation:   person, place, and  situation  Judgment:   Fair  Planning:   Fair  Affect:    Blunted  Mood:    Dysphoric  Insight:   Fair  Intelligence:   normal  Psychiatric History:  No prior psychiatric history noted but patient does have a history of alcohol abuse and dependence.  History of Substance Use or Abuse:  There is a documented history of alcohol abuse confirmed by the patient.    Family Med/Psych History: History reviewed. No pertinent family history.  Impression/DX:   IZIAHA MAHOOD is a 62 year old male referred for neuropsychological consultation during his ongoing admission onto the comprehensive inpatient rehabilitation unit.  Patient has a past medical history including obesity, fatty liver, alcohol abuse with alcoholic pancreatitis, chronic lymphedema and hypertensive status as well as tobacco use.  Patient was having mobility issues prior to admission and was able to ambulate short distances around the house with rolling walker.  Patient presented on 10/30/2023 after being found by his wife on the couch and altered mental status.  Patient was noted to be tachycardic by EMS.  CT/MRI showed no acute reversible findings.  Other issues were assessed for possible causative factors and no definitive acute findings were established.  Patient was on empiric antibiotics for short time.  Patient and renal failure.  After evaluations were completed due to functional deficits around mobility and acute encephalopathy patient was admitted to the CIR program for rehab efforts.  During today's clinical interview patient admitted to  progressively worsening physical status and is unsure what was happening but knew that he had been very sick.  Patient mitts to being extremely weak etc.  Patient denied significant issues with depression and no worsening of the symptoms during his current admission and reports that his mood is improved with improved functioning.  Patient notes motivation to improve his overall strength and  functional mobility.  Patient was oriented but slow to cognition noted.  Expressive and receptive language capacities were intact.  Disposition/Plan:  Today we worked on coping and adjustment issues and in particular continued efforts post discharge for recovery and improve overall physical functioning.          Electronically Signed   _______________________ Arley Phenix, Psy.D. Clinical Neuropsychologist

## 2023-11-21 NOTE — Progress Notes (Signed)
PROGRESS NOTE   Subjective/Complaints: No acute complaints.  No events overnight.   Vitals overall stable.  Patient endorses good p.o. intakes, good sleep, no pain. Low PVRs overnight without bladder incontinence; still incontinent of bowel type 6 stool  ROS: + bowel incontinence, -ongoing  bladder incontinence -improved Bilateral lower extremity swelling-improved with lymphedema wraps +insomnia-resolved  denies fevers, chills, N/V, abdominal pain, constipation, , SOB, cough, chest pain, headache, new weakness or new sensory changes or paraesthesias.    Objective:   No results found. Recent Labs    11/20/23 0719  WBC 4.2  HGB 10.4*  HCT 31.0*  PLT 169    Recent Labs    11/20/23 0719  NA 139  K 4.0  CL 108  CO2 25  GLUCOSE 108*  BUN 10  CREATININE 0.47*  CALCIUM 8.9     Intake/Output Summary (Last 24 hours) at 11/21/2023 1230 Last data filed at 11/20/2023 1823 Gross per 24 hour  Intake 240 ml  Output 450 ml  Net -210 ml     Pressure Injury 11/08/23 Heel Right Deep Tissue Pressure Injury - Purple or maroon localized area of discolored intact skin or blood-filled blister due to damage of underlying soft tissue from pressure and/or shear. (Active)  11/08/23 1600  Location: Heel  Location Orientation: Right  Staging: Deep Tissue Pressure Injury - Purple or maroon localized area of discolored intact skin or blood-filled blister due to damage of underlying soft tissue from pressure and/or shear.  Wound Description (Comments):   Present on Admission: Yes    Physical Exam: Vital Signs Blood pressure 119/63, pulse 84, temperature 99 F (37.2 C), resp. rate 16, height 5\' 11"  (1.803 m), weight 123.1 kg, SpO2 94%.  Constitutional: No apparent distress. Appropriate appearance for age.  Morbidly obese.  Sitting in therapy gym.  HENT: PERRLA.  Atraumatic, normocephalic.  Cardiovascular: RRR, no murmurs/rub/gallops.  2+ bilateral pitting and lymphedema -wrapped bilaterally.  Unchanged since prior exams. Respiratory: CTAB. No rales, rhonchi, or wheezing.  Abdomen: + bowel sounds,  Mildly distended, no  tenderness.  Unchanged. Skin:  + Bilateral chronic lower extremity skin thickening and discolorations; obscured now by lymphedema wraps + Bilateral buttocks and coccygeal MASD-not examined + Right heel deep tissue injury, unstageable, with underlying bruise-obscured by lymphedema wraps, heels boggy on bilateral palpation with thickened overlying skin  MSK:     No joint deformity noted.  Neurologic exam:  Cognition: Alert and awake.  Oriented x 3.  Follows basic commands, occultly with higher-level cognitive problems. + Cognitive slowing-ongoing but with significant improvement since admission-likely at baseline  Strength: Antigravity and against resistance in all 4 extremities, 5- out of 5 proximally, 5 out of 5 distally Mood: Pleasant affect, appropriate mood.  Sensation: Equal and intact in BL UE and Les.   Assessment/Plan: 1. Functional deficits which require 3+ hours per day of interdisciplinary therapy in a comprehensive inpatient rehab setting. Physiatrist is providing close team supervision and 24 hour management of active medical problems listed below. Physiatrist and rehab team continue to assess barriers to discharge/monitor patient progress toward functional and medical goals  Care Tool:  Bathing    Body parts bathed by patient: Right  arm, Left arm, Chest, Abdomen, Front perineal area, Right upper leg, Buttocks, Left upper leg, Right lower leg, Left lower leg, Face   Body parts bathed by helper: Front perineal area, Buttocks, Right upper leg, Left upper leg, Right lower leg, Left lower leg     Bathing assist Assist Level: Minimal Assistance - Patient > 75%     Upper Body Dressing/Undressing Upper body dressing   What is the patient wearing?: Pull over shirt    Upper body assist  Assist Level: Set up assist    Lower Body Dressing/Undressing Lower body dressing      What is the patient wearing?: Pants, Incontinence brief     Lower body assist Assist for lower body dressing: Maximal Assistance - Patient 25 - 49%     Toileting Toileting    Toileting assist Assist for toileting: Maximal Assistance - Patient 25 - 49%     Transfers Chair/bed transfer  Transfers assist  Chair/bed transfer activity did not occur: Safety/medical concerns (Unsafet to get out of bed.)  Chair/bed transfer assist level: Contact Guard/Touching assist     Locomotion Ambulation   Ambulation assist   Ambulation activity did not occur: Safety/medical concerns  Assist level: Contact Guard/Touching assist Assistive device: Walker-rolling Max distance: 45'   Walk 10 feet activity   Assist  Walk 10 feet activity did not occur: Safety/medical concerns  Assist level: Contact Guard/Touching assist Assistive device: Walker-rolling   Walk 50 feet activity   Assist Walk 50 feet with 2 turns activity did not occur: Safety/medical concerns         Walk 150 feet activity   Assist Walk 150 feet activity did not occur: Safety/medical concerns         Walk 10 feet on uneven surface  activity   Assist Walk 10 feet on uneven surfaces activity did not occur: Safety/medical concerns         Wheelchair     Assist Is the patient using a wheelchair?: Yes Type of Wheelchair: Manual    Wheelchair assist level: Moderate Assistance - Patient 50 - 74% Max wheelchair distance: 51'    Wheelchair 50 feet with 2 turns activity    Assist        Assist Level: Moderate Assistance - Patient 50 - 74%   Wheelchair 150 feet activity     Assist      Assist Level: Maximal Assistance - Patient 25 - 49%   Blood pressure 119/63, pulse 84, temperature 99 F (37.2 C), resp. rate 16, height 5\' 11"  (1.803 m), weight 123.1 kg, SpO2 94%.  Medical Problem List  and Plan: 1. Functional deficits secondary to acute encephalopathy, hepatic and possible metabolic component             -patient may shower             -ELOS/Goals: 10-14 days SPV, downgrade to CGA PT 12/3 - DC 11/28/23              - Stable to continue CIR  -11/26: Wife not returning calls per SLP. Remains Max A for LBD, min-mod transfers, walking 35 feet with RW Mod A with +2 WC follow. Min A for basic problem solving, Mod A for memory and awareness.   12/3: Got ahold of wife, she is unable to provide any physical assistance and no one in the home for PSV assist. Needs Stedy for most transfers; CGA to Max A for toiletting depending on session. Min A bathing. PT CGA-Min  A transfers with RW, walking up to 45 feet; did a 3 inch step yesterday with Min A. SLP Min A for problem solving, Mod for complex - feel he is close to baseline for cog.   2.  Antithrombotics: -DVT/anticoagulation:  Pharmaceutical: Lovenox             -antiplatelet therapy: N/A 3.Left knee pain: XR ordered, voltaren gel ordered   - 11/21: No complaints today. 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  May benefit from hinged knee brace when weightbearing, or injection if medically stable.  Monitor today for therapy assessments.  - 11/22: With PT, denied pain during session yesterday with ambulation and transfers. No adjustments at this time.   4. Mood/Behavior/Sleep: Provide emotional support             -antipsychotic agents: N/A  11-26: See below, applying overnight pulse ox to screen for OSA -approximately 4 events per hour, lowest desat in the 80s, not diagnostic of OSA.  Will not pursue aggressively water. -11/30 PRN melatonin  -12/1 sleeping better last night, continue current Insomnia resolved  5. Neuropsych/cognition: This patient is capable of making decisions on his own behalf.  6. Skin/Wound Care: Routine skin checks..Excoriation of buttocks with wound care as  directed -Low-air-loss mattress, Mepilex dressing to bilateral heels to prevent breakdown--add Prevalon boot RLE 11/22; never applied, re-ordered 11/27 bilaterally d/t boggy heels  -Goodhart's Butt cream to prevent worsening MASD -Hydrocyn cream to bilateral legs for flaking, dry skin -11-23: Nursing order to increase cream to twice daily, over legs and feet, and to apply Ace wrapping from feet to mid shin to prevent worsening dependent edema. 11-24: Much improved appearance of bilateral lower extremities, continue regimen of wrapping and hydrating cream. 11-26: Lymphedema wraps now on.  Concern regarding ongoing monitoring of right heel DTI with these wraps; prevalon boots as above.  11/27 discussed reason for Prevalon boot use-patient has been compliant     7. Fluids/Electrolytes/Nutrition: Routine in and outs with follow-up chemistries  -11-21: Sodium 133, repeat in a.m. for trend.  Otherwise labs stable.  11-22: Improved 134 today; next labs on Monday, monitor for cognitive changes  -Sodium up to 136 on 11/25  -12-2: Sodium 139; other labs stable.  P.o. intake adequate.  Monitor.  8.  Colitis versus ileitis-acute appendicitis ruled out.  Follow-up outpatient general surgery  11-24: Remains with frequent loose bowel movements overnight.  Lactulose held/as needed.  Will need to continue targeted bowel movements at least 2 to 3/day, however did add Imodium for excessive bowel movements.  No current concern for infection, vital stable and no white count.  11-25/26: Unchanged, ongoing.  Questional contribution of cognitive deficits.  Bowel movements do seem to be slowing down.  12-2-3: Once daily liquid to soft bowel movements, remain incontinent  9.  Hypertension/hypotension.  Blood pressure remains a bit soft remains on ProAmatine 10 mg 3 times daily as well as low-dose Coreg 12.5 mg twice daily.   - 11/21: Diastolic hypotension as below; resume midodrine 5 mg 2 times daily and wean as  tolerated -stable 11-23  11/29 BP  well-controlled, continue current regimen.  Continue midodrine  12/1 BP controlled, continue current regimen  12-2: Has remained normotensive in mid 30s; decrease midodrine to 2.5 mg twice daily, monitor 1 to 2 days before discontinuing  12-3: Has remained normotensive, discontinue midodrine     11/21/2023    8:21 AM 11/21/2023    4:56 AM 11/20/2023  8:27 PM  Vitals with BMI  Systolic 119 119 324  Diastolic 63 65 69  Pulse 84 86 82    10.  Chronic lymphedema.  Follow-up outpatient.  -PT order for lymphedema wrapping and assistance and education, management 11-22 -11-23: Per PT, only 1 PT in the hospital provide lymphedema management, looking into getting them in the room to assist in education this coming week -11/24 nursing providing Ace wrapping to bilateral lower extremities, does appear to be helping. 11/25 reports gradually improving, continue current regimen 11-26: PT Elray Mcgregor applied lymphedema wraps and educated patient on care 11-25; very much appreciate her help in this!  Patient comfortable, continue current management; PT to follow-up for tolerance and rewrap as needed and follow measurements until stable. 12/1 PT reports improving, continue current regimen -lymphedema wraps to be redone 12-2, with ongoing improvement. 12-3: Per PT, could benefit from toenail cutting for wrapping, unfortunately not available in house.  Discussed with patient outpatient follow-up for toenail management.  11.History of alcohol use.  Counseling 12.  Tobacco use.  Counseling 13.  Obesity.  BMI 37.08.  Dietary follow-up 14.  Prediabetes.  Hemoglobin A1c 5.8. d/c ISS and start metformin ER release with dinner, creatinine reviewed and is stable -Blood sugars well-controlled, DC BG checks  11/25 glucose stable on BMP  15.  BPH/bladder incontinence/bowel incontinence.  Flomax 0.4 mg daily. BP reviewed and is stable   -11-23: DC Flomax, continue timed  toileting and PVRs  11-24: PVRs remain low, remains continent during the day and incontinent at night.  Continue timed toileting.  11-26: Uncertain what barriers to continence are at this time, patient states that he sleeps through episodes of incontinence although he does feel the urge to go and knows to call the nurse for assistance.  Labs stable as above.  Will apply overnight pulse ox to assess for OSA/disordered sleep.  11-27: OSA evaluation nondiagnostic as above.  Incontinence that seems to be improving as bowel movements slow down.  Continue timed toileting and monitoring.  11/30 Occasional incontinence,  recheck PVR- 193, 220. Will retry flomax  12-2: Ongoing incontinence, PVRs mid 100s.  Continue Flomax  12/3: PVRs stable, DC    16. Hyperammonemia.  Hepatic steatosis seen on intake CT.  - 11/21: Seems more encephalopathic today, last ammonia was increased from 40s to 60s, add on to a.m. labs and resume lactulose 7.5 mg twice daily if worsening  11-22: Ammonia within normal limits.  Continues having 4-5 stools per day.  Change lactulose to 10 mg twice daily as needed to target at least 3 stools per day.  Cognition does seem to be improving.  11-30 continue current dose lactulose 10g BID PRN  17. Anemia.  hemoglobin decreased on intake from 12,6 to 10.7.  FOBT, H&H in AM. -11-22: Hemoglobin improved to 11.4, FOBT pending but no obvious blood in stool. - FOBT negative  - Hemoglobin stable 12-2.  LOS: 13 days A FACE TO FACE EVALUATION WAS PERFORMED  Angelina Sheriff 11/21/2023, 12:30 PM

## 2023-11-21 NOTE — Progress Notes (Signed)
Speech Language Pathology Daily Session Note  Patient Details  Name: Brian [name] MRN: [id number] Date of Birth: 04-03-1961  Today's Date: 11/21/2023 SLP Individual Time: 1430-1530 SLP Individual Time Calculation (min): 60 min  Short Term Goals: Week 2: SLP Short Term Goal 1 (Week 2): Patient will orient to date, time, situation, and person given external aids and supervision verbal cues. SLP Short Term Goal 2 (Week 2): Patient will utilize external and internal memory aids to recall daily information with supervision verbal cues. SLP Short Term Goal 3 (Week 2): Patient will state two cognitive and two physical impairments demonstrating intellectual awareness given min assist. SLP Short Term Goal 4 (Week 2): Patient will solve basic environmental problems in 4/5 opportunities given min assist.  Skilled Therapeutic Interventions: SLP conducted skilled therapy session targeting cognitive retraining goals. Patient reports he feels he is near baseline level of cognition, SLP in agreement given recent performance. Guided patient through mildly complex puzzle task with patient benefiting from overall supervision and extra processing time to accurately complete. Patient is oriented x4 and is able to verbalize plans for discharge and plans for assistance upon return home with supervision. Educated patient re: plans for discharge after next session, requested patient come up with any lingering questions to discuss at final session with patient in agreement. Patient was left in chair with call bell in reach and chair alarm set. SLP will continue to target goals per plan of care.       Pain Pain Assessment Pain Scale: 0-10 Pain Score: 0-No pain  Therapy/Group: Individual Therapy  Jeannie Done, M.A., CCC-SLP  [name] 11/21/2023, 4:05 PM

## 2023-11-21 NOTE — Progress Notes (Addendum)
Patient ID: Brian [name], male   DOB: March 18, 1961, 62 y.o.   MRN: [id number]  55- SW spoke with pt wife [name] to provide updates from team conference, d/c date remains 12/10, and discussed family edu. Pt wife declined family edu as she reports she understands what he is capable of doing, and she is also disabled. She will send their son in his place. Fam edu scheduled for Thursday (12/5) 9am-12pm.   *SW met with pt in room to discuss above.   *Sw went by pt room to provide updates from team conference but pt with ST. SW will follow-up with updates.   Cecile Sheerer, MSW, LCSW Office: [phone] Cell: [phone] Fax: [phone]

## 2023-11-21 NOTE — Plan of Care (Signed)
  Problem: Consults Goal: RH GENERAL PATIENT EDUCATION Description: See Patient Education module for education specifics. Outcome: Progressing Goal: Skin Care Protocol Initiated - if Braden Score 18 or less Description: If consults are not indicated, leave blank or document N/A Outcome: Progressing   Problem: RH BOWEL ELIMINATION Goal: RH STG MANAGE BOWEL WITH ASSISTANCE Description: STG Manage Bowel with min Assistance. Outcome: Progressing Goal: RH STG MANAGE BOWEL W/MEDICATION W/ASSISTANCE Description: STG Manage Bowel with Medication with min Assistance. Outcome: Progressing   Problem: RH BLADDER ELIMINATION Goal: RH STG MANAGE BLADDER WITH ASSISTANCE Description: STG Manage Bladder With min Assistance Outcome: Progressing   Problem: RH SKIN INTEGRITY Goal: RH STG SKIN FREE OF INFECTION/BREAKDOWN Description: MASD will improve and skin will be free of infection without additional breakdown with min assist  Outcome: Progressing Goal: RH STG MAINTAIN SKIN INTEGRITY WITH ASSISTANCE Description: STG Maintain Skin Integrity With min Assistance. Outcome: Progressing Goal: RH STG ABLE TO PERFORM INCISION/WOUND CARE W/ASSISTANCE Description: STG Able To Perform Incision/Wound Care With min Assistance. Outcome: Progressing   Problem: RH SAFETY Goal: RH STG ADHERE TO SAFETY PRECAUTIONS W/ASSISTANCE/DEVICE Description: STG Adhere to Safety Precautions With min Assistance/Device. Outcome: Progressing Goal: RH STG DECREASED RISK OF FALL WITH ASSISTANCE Description: STG Decreased Risk of Fall With cueing Assistance. Outcome: Progressing   Problem: RH PAIN MANAGEMENT Goal: RH STG PAIN MANAGED AT OR BELOW PT'S PAIN GOAL Description: Less than 2 with PRN medications min assist  Outcome: Progressing   Problem: RH KNOWLEDGE DEFICIT GENERAL Goal: RH STG INCREASE KNOWLEDGE OF SELF CARE AFTER HOSPITALIZATION Description: Patient/caregiver will be able to manage medications, self care,  diet/lifestyle modifications to improve overall health from nursing education, nursing handouts and other resources independently  Outcome: Progressing   Problem: Education: Goal: Ability to describe self-care measures that may prevent or decrease complications (Diabetes Survival Skills Education) will improve Outcome: Progressing Goal: Individualized Educational Video(s) Outcome: Progressing   Problem: Coping: Goal: Ability to adjust to condition or change in health will improve Outcome: Progressing   Problem: Fluid Volume: Goal: Ability to maintain a balanced intake and output will improve Outcome: Progressing   Problem: Health Behavior/Discharge Planning: Goal: Ability to identify and utilize available resources and services will improve Outcome: Progressing Goal: Ability to manage health-related needs will improve Outcome: Progressing   Problem: Metabolic: Goal: Ability to maintain appropriate glucose levels will improve Outcome: Progressing   Problem: Nutritional: Goal: Maintenance of adequate nutrition will improve Outcome: Progressing Goal: Progress toward achieving an optimal weight will improve Outcome: Progressing   Problem: Skin Integrity: Goal: Risk for impaired skin integrity will decrease Outcome: Progressing   Problem: Tissue Perfusion: Goal: Adequacy of tissue perfusion will improve Outcome: Progressing   Problem: Consults Goal: RH GENERAL PATIENT EDUCATION Description: See Patient Education module for education specifics. Outcome: Progressing

## 2023-11-21 NOTE — Progress Notes (Signed)
Occupational Therapy Session Note  Patient Details  Name: Brian [name] MRN: [id number] Date of Birth: 28-Sep-1961  Today's Date: 11/21/2023 OT Individual Time: [phone] OT Individual Time Calculation (min): 69 min    Short Term Goals: Week 2:  OT Short Term Goal 1 (Week 2): Patient will complete 1 step of toileting task OT Short Term Goal 2 (Week 2): Patient will complete toilet transfer with CGA OT Short Term Goal 3 (Week 2): Patient will maintain standing at the sink for 3 minutes in preparation for BADL tasks.  Skilled Therapeutic Interventions/Progress Updates:     Pt received sitting EOB, dressed for the day upon OT arrival. Pt presenting to be in good spirits receptive to skilled OT session reporting 0/10 pain- OT offering intermittent rest breaks, repositioning, and therapeutic support to optimize participation in therapy session. Focus this session BADL retraining, activity tolerance, and dynamic standing balance.  Pt requesting to use restroom at beginning of session. Utilized stedy for transport into BR to facilitate increased opportunitieds to work on sit<>stands and for energy conservation. Increased time required on toilet d/t need for BM- incontinent void in brief noted and continent BM documented in flowsheets. Pt able to complete 3/3 toileting task this session with CGA using stedy grab bar for balance with increased time and standing rest breaks required when completing posterior peri-care.   Positioned Pt at sink for grooming/hygiene tasks seated in wc for energy conservation with Pt able to complete oral care, wash face, and grooming hair with supervision.   Engaged Pt in completing wc propulsion to therapy gym for UB strength and endurance training to increase overall activity tolerance for BADLs. Pt able to propel ~150 ft with supervision with 2 seated rest breaks for energy conservation.   Pt completed UB endurance training in seated position on SCIFIT arm bike to  promote increased endurance and B UE functional use for increased independence in BADLs. Pt completed the following intervals with rest break provided following:  -4 min, level 5 backwards -4 min, level 5 forwards -4 min, level 7 backwards  Engaged Pt in dynamic standing balance activity with anterior and posterior reaching and blocked practice of sit<>stands incorporated into task to simulate skills required for BADLs. Utilized meaningful horseshoe throwing task to optimize participation and salience to functional task. Pt completed 4x7 reps of sit<>stands using RW and maintained standing balance while throwing horse shoe at target with emphasis on hand placement during sit<>stands and controled decent. Pt able to complete task at close supervision/CGA level with mod verbal cues fading to min verbal cues with increased practice for technique.   Transported Pt back to room total A in wc for energy conservation. Pt was left resting in wc with call bell in reach, chair alarm on, and all needs met.    Therapy Documentation Precautions:  Precautions Precautions: Fall Precaution Comments: incontinence Restrictions Weight Bearing Restrictions: No  Therapy/Group: Individual Therapy  Clide Deutscher 11/21/2023, 8:00 AM

## 2023-11-21 NOTE — Progress Notes (Signed)
Physical Therapy Session Note  Patient Details  Name: Brian [name] MRN: [id number] Date of Birth: June 28, 1961  Today's Date: 11/21/2023 PT Individual Time: 1102-1158 PT Individual Time Calculation (min): 56 min   Short Term Goals: Week 2:  PT Short Term Goal 1 (Week 2): Pt will ambulate 51' with CGA with LRAD PT Short Term Goal 2 (Week 2): Pt will consistently complete transfers with CGA with LRAD PT Short Term Goal 3 (Week 2): Pt will complete up/down 5" curb with min assist with LRAD  Skilled Therapeutic Interventions/Progress Updates:    Pt presents in room in [facility], motivated to participate with PT. Pt denies pain at this time. Session focused on gait training and therapeutic exercise for BLE strengthening and muscle fiber recruitment needed for step/curb negotiation.  Pt completes ambulation into/out of bathroom with CGA/min assist with RW ~20', comes to sitting following gait in WC. Pt educated on walking with nursing staff to bathroom for toileting, therapist updates safety plan and notifies nursing staff. Pt transported to main gym via WC dependently for time management and energy conservation.  Pt ambulates 25' with RW CGA/min assist, demonstrating slow gait speed, slight knee instability but no buckling noted, sits quickly with cues for hand placement. Pt completes forward/backward walking 3x8' with RW CGA/light min assist, improved stability with repetition, completed to promote improved multidirectional stepping stability needed for functional transfers and ambulation.  Pt then completes standing therex to promote BLE strengthening and stability needed for functional transfers, ambulation, and step negotiation including: -step ups 3" step BHRs 2x10 - step taps 6" step BUE 2x10 - marches unilateral UE support x5 and x10 reps BLE - mini squats 2x10 no UE support (completed in //bars with therapist positioned in front to maintain CGA on bilateral knees in case of buckling) -  sit<>stands x5 no UE support from [facility]  Pt returns to room and remains seated in [facility] with all needs within reach, cal light in place and chair alarm donned and activated at end of session.   Therapy Documentation Precautions:  Precautions Precautions: Fall Precaution Comments: incontinence Restrictions Weight Bearing Restrictions: No   Therapy/Group: Individual Therapy  Edwin Cap PT, DPT 11/21/2023, 12:01 PM

## 2023-11-21 NOTE — Patient Care Conference (Signed)
Inpatient RehabilitationTeam Conference and Plan of Care Update Date: 11/21/2023   Time: 10:36 AM    Patient Name: Brian [name]      Medical Record Number: [id number]  Date of Birth: 1961/01/31 Sex: Male         Room/Bed: 4M13C/4M13C-01 Payor Info: Payor: MEDICAID PENDING / Plan: MEDICAID PENDING / Product Type: *No Product type* /    Admit Date/Time:  11/08/2023  3:43 PM  Primary Diagnosis:  Acute metabolic encephalopathy  Hospital Problems: Principal Problem:   Acute metabolic encephalopathy Active Problems:   Insomnia    Expected Discharge Date: Expected Discharge Date: 11/28/23  Team Members Present: Physician leading conference: Dr. Elijah Birk Social Worker Present: Cecile Sheerer, LCSWA Nurse Present: Chana Bode, RN PT Present: Darrold Span, PT OT Present: Roney Mans, OT SLP Present: Jeannie Done, SLP PPS Coordinator present : Fae Pippin, SLP     Current Status/Progress Goal Weekly Team Focus  Bowel/Bladder   pt continent of bladder and has episodes of incontience with bowel   Gain full continence   Assist with time toileting q2-4hrs    Swallow/Nutrition/ Hydration               ADL's   Set up A UB, Mod LB, Min A toileting   Supervision/CGA for BADL tasks and transfers   Barriers- endurance, BLE weakness/lymphedema though improved with lymph wrap, dynamic standing balance, fam ed needed    Mobility   CGA/minA transfers, gait CGA 45' with RW, up/down one 3" step with BHRs min assist   CGA ambulatory  barriers: strength/endurance; focus on strengthening/endurance, step ups, transfer training, gait training    Communication                Safety/Cognition/ Behavioral Observations  minA for basic problem solving, mod for mildly complex, minA for working memory, min-mod for stating cognitive deficits   supervision   working memory, awareness, problem solving    Pain   no c/o pain   Remain pain free   Assess qshift and prn     Skin   Lympyedema BLE's. Masd to the buttocks   Maintain skin integrity  Assess qshift and prn      Discharge Planning:  Pt is uninsured. Pt to d/c to home with support from his wife and she is unable to physically assist him; their son who can help PRN as he works 12hr shifts. SW will confirm there are no barriers to discharge.   Team Discussion: Patient post metabolic encephalopathy with debility.  Patient working on energy conservation problem solving and wound/skin care.  Patient on target to meet rehab goals: yes, currently needs use of a stedy for transfers but completes sit - stand with CGA from an elevated surface. Needs min assist for showering and total assist for lower body dressing. Able to ambulate using a RW with CGA - min assist up to 45' and manage 3 steps with min assist.  Needs min assist for cognition. Goals for discharge set for CGA - min assist overall.  *See Care Plan and progress notes for long and short-term goals.   Revisions to Treatment Plan:  Lymphadema wraps Prevalon boots Timed toileting   Teaching Needs: Safety, skin care, wound care, medications, diet modifications, transfers, toileting, etc.   Current Barriers to Discharge: Decreased caregiver support, Insurance for SNF coverage, and lack of insurance for follow up services  Possible Resolutions to Barriers: Family education     Medical Summary Current Status: medically complicated by  cognitive deficits,  incontinence of bowel and bladder, lymphedema, and wounds/skin integrity  Barriers to Discharge: Incontinence;Morbid Obesity;Medical stability;Self-care education   Possible Resolutions to [employer] Focus: Continue timed toileting to promote continence and titrate bowel regimen, education on lymphedema management for lower extremities with wrapping, wound care/monitorring   Continued Need for Acute Rehabilitation Level of Care: The patient requires daily medical management by a  physician with specialized training in physical medicine and rehabilitation for the following reasons: Direction of a multidisciplinary physical rehabilitation program to maximize functional independence : Yes Medical management of patient stability for increased activity during participation in an intensive rehabilitation regime.: Yes Analysis of laboratory values and/or radiology reports with any subsequent need for medication adjustment and/or medical intervention. : Yes   I attest that I was present, lead the team conference, and concur with the assessment and plan of the team.   Pamelia Hoit 11/21/2023, 3:24 PM

## 2023-11-22 NOTE — Progress Notes (Signed)
Patient ID: Brian [name], male   DOB: [birth year], 62 y.o.   MRN: [id number]  SW met with pt in room to provide updates from team conference, d/c date 12/10 and family edu tomorrow with their son.   Cecile Sheerer, MSW, LCSW Office: [phone] Cell: [phone] Fax: [phone]

## 2023-11-22 NOTE — Progress Notes (Signed)
Occupational Therapy Weekly Progress Note  Patient Details  Name: ADALBERT DUMITRESCU MRN: [id number] Date of Birth: [birth year]  Beginning of progress report period: November 17, 2023 End of progress report period: November 22, 2023  Today's Date: 11/22/2023 OT Individual Time: 1433-1530 OT Individual Time Calculation (min): 57 min  and Today's Date: 11/22/2023 OT Missed Time: 18 Minutes Missed Time Reason: Unavailable (comment) (recieving treatment for edema management from PT)   Patient has met 3 of 3 short term goals.  Mr. Search is steadily progressing towards reaching his LTGs. He is completing UB bathing and dressing in seated position with set-up A, LB bathing/dressing mod A, and toileting min A. He is completing toilet and shower transfers using RW with CGA. He is now able to maintain standing position using RW for balance for 3-5 minutes with seated rest break following during BADLs. He is planning to d/c home with his son and wis wife. Family education is planned for 12/05.   Patient continues to demonstrate the following deficits: muscle weakness, decreased cardiorespiratoy endurance, and decreased standing balance and decreased balance strategies and therefore will continue to benefit from skilled OT intervention to enhance overall performance with BADL and iADL .  Patient progressing toward long term goals..  Continue plan of care.  OT Short Term Goals Week 2:  OT Short Term Goal 1 (Week 2): Patient will complete 1 step of toileting task OT Short Term Goal 1 - Progress (Week 2): Met OT Short Term Goal 2 (Week 2): Patient will complete toilet transfer with CGA OT Short Term Goal 2 - Progress (Week 2): Met OT Short Term Goal 3 (Week 2): Patient will maintain standing at the sink for 3 minutes in preparation for BADL tasks. OT Short Term Goal 3 - Progress (Week 2): Met Week 3:  OT Short Term Goal 1 (Week 3): STG=LTG d/t ELOS  Skilled Therapeutic Interventions/Progress Updates:      Pt received reclined in bed with PT, Cindy, in room providing treatment for edema management. Missed 18 minutes of skilled OT treatment d/t scheduling conflict.  Pt presenting to be in good spirits receptive to skilled OT session reporting 0/10 pain- OT offering intermittent rest breaks, repositioning, and therapeutic support to optimize participation in therapy session. Pt dressed and ready for the day upon OT arrival with all BADL need met. Pt transitioned to EOB using bed features with supervision. Donned nonslip socks EOB total A for time management.   Stand pivot EOB>wc using RW CGA. Transported Pt total A to therapy gym in wc for energy conservation and time management.   Engaged Pt in completing series of short distance functional mobility in therapy gym, navigating around obstacles using RW to simulate home mobility. Pt able to complete ~15 ft of functional mobility using RW with CGA provided for safety and short rest breaks provided between each trial. Pt demonstrating improved problem solving and activity tolerance for increased independence and safety at home.   Engaged Pt in dynamic standing balance connect-4 activity to increase standing tolerance and dynamic balance for BADLs. Pt able to maintain standing balance with CGA single UE support on RW for 5 minutes x2 trials.   Pt completed seated beach ball volley activity sitting EOM using 4# weighted dowel to increase B UE strength and endurance for BADL and IADL tasks. Pt able to complete 1.5 minutes x3 trials with seated rest breaks provided between trials.   Pt completed UB endurance training in seated position on SCIFIT arm bike to  promote increased endurance and B UE functional use for increased independence in BADLs. Pt completed the following intervals with rest break provided following:  -4 min, level 6 backwards -4 min, level 6 forwards -4 min, level 6 backwards  Transported Pt back to room total A in wc for energy conservation.  Pt was left resting in wc with call bell in reach, seatbelt alarm on, and all needs met.    Therapy Documentation Precautions:  Precautions Precautions: Fall Precaution Comments: incontinence Restrictions Weight Bearing Restrictions: No   Therapy/Group: Individual Therapy  Clide Deutscher 11/22/2023, 3:11 PM

## 2023-11-22 NOTE — Progress Notes (Signed)
Speech Language Pathology Daily Session Note  Patient Details  Name: Brian [name] MRN: [id number] Date of Birth: 12/06/1961  Today's Date: 11/22/2023 SLP Individual Time: 0914-1000 SLP Individual Time Calculation (min): 46 min  Short Term Goals: Week 2: SLP Short Term Goal 1 (Week 2): Patient will orient to date, time, situation, and person given external aids and supervision verbal cues. SLP Short Term Goal 2 (Week 2): Patient will utilize external and internal memory aids to recall daily information with supervision verbal cues. SLP Short Term Goal 3 (Week 2): Patient will state two cognitive and two physical impairments demonstrating intellectual awareness given min assist. SLP Short Term Goal 4 (Week 2): Patient will solve basic environmental problems in 4/5 opportunities given min assist.  Skilled Therapeutic Interventions: SLP conducted skilled therapy session targeting cognitive retraining goals. Given patient's recent performance at near goal level at previous sessions, conducted cognitive testing to assess for any lingering cognitive deficits. Surprisingly this session, patient required min assist for orientation to location and to year, changed from previous need for supervision for date and location. Notified MD of change. Patient demonstrated moderate deficits in the areas of problem solving, calculations, constructional skills, and attention with mild deficits in memory. Will plan to continue to work with patient towards plan of care goals and will continue to assess for cognitive changes noted between sessions. SLP and patient completed cognitive game requiring scanning, anticipation of opponent's next move, and sequencing of piece placement. Patient benefited from mod fading to min assist to make best choices to promote game success. Patient was left in chair with call bell in reach and chair alarm set. SLP will continue to target goals per plan of care.       Pain Pain  Assessment Pain Scale: 0-10 Pain Score: 0-No pain  Therapy/Group: Individual Therapy  Jeannie Done, M.A., CCC-SLP  [name] 11/22/2023, 12:33 PM

## 2023-11-22 NOTE — Progress Notes (Signed)
Physical Therapy Session Note  Patient Details  Name: Brian [name] MRN: [id number] Date of Birth: [birth year]  Today's Date: 11/22/2023 PT Individual Time: [phone] PT Individual Time Calculation (min): 40 min   Short Term Goals: Week 2:  PT Short Term Goal 1 (Week 2): Pt will ambulate 7' with CGA with LRAD PT Short Term Goal 2 (Week 2): Pt will consistently complete transfers with CGA with LRAD PT Short Term Goal 3 (Week 2): Pt will complete up/down 5" curb with min assist with LRAD  Skilled Therapeutic Interventions/Progress Updates:    Chart reviewed and pt agreeable to therapy. Pt received seated in WC with no c/o pain. Session focused on functional transfers, amb, and step navigation to promote safe home access. Pt initiated session with grooming at sink using sup. Pt then transferred to therapy gym for time. Pt completed amb of 3 x 240ft using CGA + RW. Pt amb noted to push heavily into UE that mildly reduced with VC. Pt also required strong VC for safe sitting. Pt then completed step practice up/down 1 step with B rails for 2x10. Pt noted to require VC to complete full knee ext during step up. At end of session, pt was left seated in [facility] with alarm engaged, nurse call bell and all needs in reach.     Therapy Documentation Precautions:  Precautions Precautions: Fall Precaution Comments: incontinence Restrictions Weight Bearing Restrictions: No General:      Therapy/Group: Individual Therapy  Dionne Milo, PT, DPT 11/22/2023, 8:38 AM

## 2023-11-22 NOTE — Progress Notes (Signed)
Physical Therapy Session Note  Patient Details  Name: Brian [name] MRN: [id number] Date of Birth: 04/03/1961  Today's Date: 11/22/2023 PT Individual Time: 1015-1056 PT Individual Time Calculation (min): 41 min   Short Term Goals: Week 2:  PT Short Term Goal 1 (Week 2): Pt will ambulate 44' with CGA with LRAD PT Short Term Goal 2 (Week 2): Pt will consistently complete transfers with CGA with LRAD PT Short Term Goal 3 (Week 2): Pt will complete up/down 5" curb with min assist with LRAD  Skilled Therapeutic Interventions/Progress Updates:      Pt sitting upright in wheelchair to start - agreeable to therapy treatment. Has no pain but reports having a poor nights rest - too cold and had trouble getting comfortable in bed.   Transported patient in wheelchair to ortho rehab gym. Completed ambulatory car transfer, CGA for gait and minA for getting BLE into and out of the vehicle. Patient having difficulty lifting both LE's to clear floor board - anticipate personal vehicle (h[employer]) will be easier with a lower floor board height.   Gait training up/down ~86ft ramp with CGA and RW - safety cues for turning and for monitoring his pace as he descends the ramp. He puts quite a bit of weight through BUE through RW frame so worked on reducing this.   Assisted onto the Nustep and he completed x12 minutes at L5 resistance using BUE/BLE - pt maintaining a cadence > 60 spm, achieving a total of 975 steps. Reports 7/10 on RPE scale.   Longer distance gait training 2x159ft with CGA and RW - cues only for upright posture and forward gaze.   Pt ended session seated in w/c in his room - able to locate his room without cues. Provided a warm blanket and patient made comfortable. Chair pad alarm on, all needs met.   Therapy Documentation Precautions:  Precautions Precautions: Fall Precaution Comments: incontinence Restrictions Weight Bearing Restrictions: No General:     Therapy/Group:  Individual Therapy  Orrin Brigham 11/22/2023, 7:41 AM

## 2023-11-22 NOTE — Progress Notes (Signed)
PROGRESS NOTE   Subjective/Complaints: No acute complaints.  No events overnight.  SLP endorses that he was initially more confused this a.m., especially to place, but did resolve after he was awake and stimulated for little bit.  Patient denies any complaints, states he is sleeping well, eating well, no pain or headache.  Vital stable, blood pressure normotensive off of midodrine. Last bowel movement 12-3, medium, continent.  No urinary incontinence overnight.  ROS: + bowel incontinence, -improved  bladder incontinence -improved Bilateral lower extremity swelling-improved with lymphedema wraps +insomnia-resolved  denies fevers, chills, N/V, abdominal pain, constipation, , SOB, cough, chest pain, headache, new weakness or new sensory changes or paraesthesias.    Objective:   No results found. Recent Labs    11/20/23 0719  WBC 4.2  HGB 10.4*  HCT 31.0*  PLT 169    Recent Labs    11/20/23 0719  NA 139  K 4.0  CL 108  CO2 25  GLUCOSE 108*  BUN 10  CREATININE 0.47*  CALCIUM 8.9     Intake/Output Summary (Last 24 hours) at 11/22/2023 0906 Last data filed at 11/22/2023 0747 Gross per 24 hour  Intake 840 ml  Output 965 ml  Net -125 ml     Pressure Injury 11/08/23 Heel Right Deep Tissue Pressure Injury - Purple or maroon localized area of discolored intact skin or blood-filled blister due to damage of underlying soft tissue from pressure and/or shear. (Active)  11/08/23 1600  Location: Heel  Location Orientation: Right  Staging: Deep Tissue Pressure Injury - Purple or maroon localized area of discolored intact skin or blood-filled blister due to damage of underlying soft tissue from pressure and/or shear.  Wound Description (Comments):   Present on Admission: Yes    Physical Exam: Vital Signs Blood pressure 124/66, pulse 83, temperature 98.5 F (36.9 C), resp. rate 18, height 5\' 11"  (1.803 m), weight 123.1  kg, SpO2 95%.  Constitutional: No apparent distress. Appropriate appearance for age.  Morbidly obese.  Sitting up at bedside table with SLP. HENT: PERRLA.  Atraumatic, normocephalic.  No apparent nystagmus on bilateral EOMI. Cardiovascular: RRR, no murmurs/rub/gallops. 2-3+ bilateral pitting and lymphedema -wrapped bilaterally-stable Respiratory: CTAB. No rales, rhonchi, or wheezing.  Abdomen: + bowel sounds,  Mildly distended, no  tenderness.  Unchanged. Skin:  + Bilateral chronic lower extremity skin thickening and discolorations; obscured now by lymphedema wraps + Bilateral buttocks and coccygeal MASD-not examined + Right heel deep tissue injury, unstageable, with underlying bruise-obscured by lymphedema wraps, heels boggy on bilateral palpation with thickened overlying skin-unchanged 12-4  MSK:     No joint deformity noted.  Neurologic exam:  Cognition: Alert and awake.  Oriented to person, place, time, and reason for admission..  Follows basic commands, some ongoing difficulty with higher-level cognitive problems. + Cognitive slowing-ongoing but with significant improvement since admission-likely at baseline  Strength: Antigravity and against resistance in all 4 extremities, 5- out of 5 proximally, 5 out of 5 distally-unchanged Mood: Pleasant affect, appropriate mood.  Sensation: Equal and intact in BL UE and Les.   Assessment/Plan: 1. Functional deficits which require 3+ hours per day of interdisciplinary therapy in a comprehensive inpatient rehab setting. Physiatrist  is providing close team supervision and 24 hour management of active medical problems listed below. Physiatrist and rehab team continue to assess barriers to discharge/monitor patient progress toward functional and medical goals  Care Tool:  Bathing    Body parts bathed by patient: Right arm, Left arm, Chest, Abdomen, Front perineal area, Right upper leg, Buttocks, Left upper leg, Right lower leg, Left lower leg,  Face   Body parts bathed by helper: Front perineal area, Buttocks, Right upper leg, Left upper leg, Right lower leg, Left lower leg     Bathing assist Assist Level: Minimal Assistance - Patient > 75%     Upper Body Dressing/Undressing Upper body dressing   What is the patient wearing?: Pull over shirt    Upper body assist Assist Level: Set up assist    Lower Body Dressing/Undressing Lower body dressing      What is the patient wearing?: Pants, Incontinence brief     Lower body assist Assist for lower body dressing: Maximal Assistance - Patient 25 - 49%     Toileting Toileting    Toileting assist Assist for toileting: Maximal Assistance - Patient 25 - 49%     Transfers Chair/bed transfer  Transfers assist  Chair/bed transfer activity did not occur: Safety/medical concerns (Unsafet to get out of bed.)  Chair/bed transfer assist level: Contact Guard/Touching assist     Locomotion Ambulation   Ambulation assist   Ambulation activity did not occur: Safety/medical concerns  Assist level: Contact Guard/Touching assist Assistive device: Walker-rolling Max distance: 45'   Walk 10 feet activity   Assist  Walk 10 feet activity did not occur: Safety/medical concerns  Assist level: Contact Guard/Touching assist Assistive device: Walker-rolling   Walk 50 feet activity   Assist Walk 50 feet with 2 turns activity did not occur: Safety/medical concerns         Walk 150 feet activity   Assist Walk 150 feet activity did not occur: Safety/medical concerns         Walk 10 feet on uneven surface  activity   Assist Walk 10 feet on uneven surfaces activity did not occur: Safety/medical concerns         Wheelchair     Assist Is the patient using a wheelchair?: Yes Type of Wheelchair: Manual    Wheelchair assist level: Moderate Assistance - Patient 50 - 74% Max wheelchair distance: 75'    Wheelchair 50 feet with 2 turns  activity    Assist        Assist Level: Moderate Assistance - Patient 50 - 74%   Wheelchair 150 feet activity     Assist      Assist Level: Maximal Assistance - Patient 25 - 49%   Blood pressure 124/66, pulse 83, temperature 98.5 F (36.9 C), resp. rate 18, height 5\' 11"  (1.803 m), weight 123.1 kg, SpO2 95%.  Medical Problem List and Plan: 1. Functional deficits secondary to acute encephalopathy, hepatic and possible metabolic component             -patient may shower             -ELOS/Goals: 10-14 days SPV, downgrade to CGA PT 12/3 - DC 11/28/23              - Stable to continue CIR  -11/26: Wife not returning calls per SLP. Remains Max A for LBD, min-mod transfers, walking 35 feet with RW Mod A with +2 WC follow. Min A for basic problem solving, Mod A for  memory and awareness.   12/3: Got ahold of wife, she is unable to provide any physical assistance and no one in the home for PSV assist. Needs Stedy for most transfers; CGA to Max A for toiletting depending on session. Min A bathing. PT CGA-Min A transfers with RW, walking up to 45 feet; did a 3 inch step yesterday with Min A. SLP Min A for problem solving, Mod for complex - feel he is close to baseline for cog.   2.  Antithrombotics: -DVT/anticoagulation:  Pharmaceutical: Lovenox             -antiplatelet therapy: N/A 3.Left knee pain: XR ordered, voltaren gel ordered   - 11/21: No complaints today. 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  May benefit from hinged knee brace when weightbearing, or injection if medically stable.  Monitor today for therapy assessments.  - 11/22: With PT, denied pain during session yesterday with ambulation and transfers. No adjustments at this time.   4. Mood/Behavior/Sleep: Provide emotional support             -antipsychotic agents: N/A  11-26: See below, applying overnight pulse ox to screen for OSA -approximately 4 events per hour, lowest desat  in the 80s, not diagnostic of OSA.  Will not pursue aggressively water. -11/30 PRN melatonin  -12/1 sleeping better last night, continue current Insomnia resolved  5. Neuropsych/cognition: This patient is capable of making decisions on his own behalf.  6. Skin/Wound Care: Routine skin checks..Excoriation of buttocks with wound care as directed -Low-air-loss mattress, Mepilex dressing to bilateral heels to prevent breakdown--add Prevalon boot RLE 11/22; never applied, re-ordered 11/27 bilaterally d/t boggy heels  -Goodhart's Butt cream to prevent worsening MASD -Hydrocyn cream to bilateral legs for flaking, dry skin -11-23: Nursing order to increase cream to twice daily, over legs and feet, and to apply Ace wrapping from feet to mid shin to prevent worsening dependent edema. 11-24: Much improved appearance of bilateral lower extremities, continue regimen of wrapping and hydrating cream. 11-26: Lymphedema wraps now on.  Concern regarding ongoing monitoring of right heel DTI with these wraps; prevalon boots as above.  11/27 discussed reason for Prevalon boot use-patient has been compliant  -Will ask for no photos of bilateral heels with next wrap.    7. Fluids/Electrolytes/Nutrition: Routine in and outs with follow-up chemistries  -11-21: Sodium 133, repeat in a.m. for trend.  Otherwise labs stable.  11-22: Improved 134 today; next labs on Monday, monitor for cognitive changes  -Sodium up to 136 on 11/25  -12-2: Sodium 139; other labs stable.  P.o. intake adequate.  Monitor.  8.  Colitis versus ileitis-acute appendicitis ruled out.  Follow-up outpatient general surgery  11-24: Remains with frequent loose bowel movements overnight.  Lactulose held/as needed.  Will need to continue targeted bowel movements at least 2 to 3/day, however did add Imodium for excessive bowel movements.  No current concern for infection, vital stable and no white count.  11-25/26: Unchanged, ongoing.  Questional  contribution of cognitive deficits.  Bowel movements do seem to be slowing down.  12-2-3: Once daily liquid to soft bowel movements, remain incontinent  9.  Hypertension/hypotension.  Blood pressure remains a bit soft remains on ProAmatine 10 mg 3 times daily as well as low-dose Coreg 12.5 mg twice daily.   - 11/21: Diastolic hypotension as below; resume midodrine 5 mg 2 times daily and wean as tolerated -stable 11-23  11/29 BP  well-controlled, continue current regimen.  Continue  midodrine  12/1 BP controlled, continue current regimen  12-2: Has remained normotensive in mid 30s; decrease midodrine to 2.5 mg twice daily, monitor 1 to 2 days before discontinuing  12-3: Has remained normotensive, discontinue midodrine  12-4: Stable/normotensive off of midodrine.     11/22/2023    8:47 AM 11/22/2023    4:31 AM 11/21/2023    7:45 PM  Vitals with BMI  Systolic 124 115 130  Diastolic 66 62 72  Pulse 83 81 74    10.  Chronic lymphedema.  Follow-up outpatient.  -PT order for lymphedema wrapping and assistance and education, management 11-22 -11-23: Per PT, only 1 PT in the hospital provide lymphedema management, looking into getting them in the room to assist in education this coming week -11/24 nursing providing Ace wrapping to bilateral lower extremities, does appear to be helping. 11/25 reports gradually improving, continue current regimen 11-26: PT Elray Mcgregor applied lymphedema wraps and educated patient on care 11-25; very much appreciate her help in this!  Patient comfortable, continue current management; PT to follow-up for tolerance and rewrap as needed and follow measurements until stable. 12/1 PT reports improving, continue current regimen -lymphedema wraps to be redone 12-2, with ongoing improvement. 12-3: Per PT, could benefit from toenail cutting for wrapping, unfortunately not available in house.  Discussed with patient outpatient follow-up for toenail management.  11.History of  alcohol use.  Counseling 12.  Tobacco use.  Counseling 13.  Obesity.  BMI 37.08.  Dietary follow-up 14.  Prediabetes.  Hemoglobin A1c 5.8. d/c ISS and start metformin ER release with dinner, creatinine reviewed and is stable -Blood sugars well-controlled, DC BG checks  11/25 glucose stable on BMP  15.  BPH/bladder incontinence/bowel incontinence.  Flomax 0.4 mg daily. BP reviewed and is stable   -11-23: DC Flomax, continue timed toileting and PVRs  11-24: PVRs remain low, remains continent during the day and incontinent at night.  Continue timed toileting.  11-26: Uncertain what barriers to continence are at this time, patient states that he sleeps through episodes of incontinence although he does feel the urge to go and knows to call the nurse for assistance.  Labs stable as above.  Will apply overnight pulse ox to assess for OSA/disordered sleep.  11-27: OSA evaluation nondiagnostic as above.  Incontinence that seems to be improving as bowel movements slow down.  Continue timed toileting and monitoring.  11/30 Occasional incontinence,  recheck PVR- 193, 220. Will retry flomax  12-2: Ongoing incontinence, PVRs mid 100s.  Continue Flomax  12/3: PVRs stable, DC   12-4: Some improved continence overnight!   16. Hyperammonemia.  Hepatic steatosis seen on intake CT.  - 11/21: Seems more encephalopathic today, last ammonia was increased from 40s to 60s, add on to a.m. labs and resume lactulose 7.5 mg twice daily if worsening  11-22: Ammonia within normal limits.  Continues having 4-5 stools per day.  Change lactulose to 10 mg twice daily as needed to target at least 3 stools per day.  Cognition does seem to be improving.  11-30 continue current dose lactulose 10g BID PRN  12/4: Some mild cognitive deficits this a.m., which resolved quickly with improved arousal and stimulation.  Repeat labs, ammonia in AM.  17. Anemia.  hemoglobin decreased on intake from 12,6 to 10.7.  FOBT, H&H in AM. -11-22:  Hemoglobin improved to 11.4, FOBT pending but no obvious blood in stool. - FOBT negative  - Hemoglobin stable 12-2.  LOS: 14 days A FACE TO FACE  EVALUATION WAS PERFORMED  Angelina Sheriff 11/22/2023, 9:06 AM

## 2023-11-22 NOTE — Plan of Care (Signed)
  Problem: Consults Goal: RH GENERAL PATIENT EDUCATION Description: See Patient Education module for education specifics. Outcome: Progressing Goal: Skin Care Protocol Initiated - if Braden Score 18 or less Description: If consults are not indicated, leave blank or document N/A Outcome: Progressing   Problem: RH BOWEL ELIMINATION Goal: RH STG MANAGE BOWEL WITH ASSISTANCE Description: STG Manage Bowel with min Assistance. Outcome: Progressing Goal: RH STG MANAGE BOWEL W/MEDICATION W/ASSISTANCE Description: STG Manage Bowel with Medication with min Assistance. Outcome: Progressing   Problem: RH BLADDER ELIMINATION Goal: RH STG MANAGE BLADDER WITH ASSISTANCE Description: STG Manage Bladder With min Assistance Outcome: Progressing   Problem: RH SKIN INTEGRITY Goal: RH STG SKIN FREE OF INFECTION/BREAKDOWN Description: MASD will improve and skin will be free of infection without additional breakdown with min assist  Outcome: Progressing Goal: RH STG MAINTAIN SKIN INTEGRITY WITH ASSISTANCE Description: STG Maintain Skin Integrity With min Assistance. Outcome: Progressing Goal: RH STG ABLE TO PERFORM INCISION/WOUND CARE W/ASSISTANCE Description: STG Able To Perform Incision/Wound Care With min Assistance. Outcome: Progressing   Problem: RH SAFETY Goal: RH STG ADHERE TO SAFETY PRECAUTIONS W/ASSISTANCE/DEVICE Description: STG Adhere to Safety Precautions With min Assistance/Device. Outcome: Progressing Goal: RH STG DECREASED RISK OF FALL WITH ASSISTANCE Description: STG Decreased Risk of Fall With cueing Assistance. Outcome: Progressing   Problem: RH PAIN MANAGEMENT Goal: RH STG PAIN MANAGED AT OR BELOW PT'S PAIN GOAL Description: Less than 2 with PRN medications min assist  Outcome: Progressing   Problem: RH KNOWLEDGE DEFICIT GENERAL Goal: RH STG INCREASE KNOWLEDGE OF SELF CARE AFTER HOSPITALIZATION Description: Patient/caregiver will be able to manage medications, self care,  diet/lifestyle modifications to improve overall health from nursing education, nursing handouts and other resources independently  Outcome: Progressing   Problem: Education: Goal: Ability to describe self-care measures that may prevent or decrease complications (Diabetes Survival Skills Education) will improve Outcome: Progressing Goal: Individualized Educational Video(s) Outcome: Progressing   Problem: Coping: Goal: Ability to adjust to condition or change in health will improve Outcome: Progressing   Problem: Fluid Volume: Goal: Ability to maintain a balanced intake and output will improve Outcome: Progressing   Problem: Health Behavior/Discharge Planning: Goal: Ability to identify and utilize available resources and services will improve Outcome: Progressing Goal: Ability to manage health-related needs will improve Outcome: Progressing   Problem: Metabolic: Goal: Ability to maintain appropriate glucose levels will improve Outcome: Progressing   Problem: Nutritional: Goal: Maintenance of adequate nutrition will improve Outcome: Progressing Goal: Progress toward achieving an optimal weight will improve Outcome: Progressing   Problem: Skin Integrity: Goal: Risk for impaired skin integrity will decrease Outcome: Progressing   Problem: Consults Goal: RH GENERAL PATIENT EDUCATION Description: See Patient Education module for education specifics. Outcome: Progressing

## 2023-11-22 NOTE — Consult Note (Addendum)
WOC Nurse wound re-consult: Requested to provide topical treatment recommendations for right heel since Pt plans to discharge home soon.  Right heel with previously noted present on admission Deep tissue pressure injury on bedside nurses' wound flow sheet.  4.5X5cm, darker colored area with intact skin, no odor, drainage, or fluctuance but soft to touch.  Expect  the skin will eventually loosen and peel and evolve into a full thickness wound. Pt has been wearing Prevalon boots to reduce pressure.  Topical treatment orders provided for bedside nurses to perform as follows: Apply Xeroform gauze to right heel wound Q day and cover with foam dressing.  Change foam dressing Q 3 days or PRN soiling.  Use Prevalon boot to reduce pressure to heel when in bed. Please re-consult if further assistance is needed.  Thank-you,  Cammie Mcgee MSN, RN, CWOCN, [place], CNS [phone]

## 2023-11-23 LAB — CBC WITH DIFFERENTIAL/PLATELET
Abs Immature Granulocytes: 0.01 10*3/uL (ref 0.00–0.07)
Basophils Absolute: 0 10*3/uL (ref 0.0–0.1)
Basophils Relative: 1 %
Eosinophils Absolute: 0.2 10*3/uL (ref 0.0–0.5)
Eosinophils Relative: 4 %
HCT: 33.8 % — ABNORMAL LOW (ref 39.0–52.0)
Hemoglobin: 11.4 g/dL — ABNORMAL LOW (ref 13.0–17.0)
Immature Granulocytes: 0 %
Lymphocytes Relative: 17 %
Lymphs Abs: 0.7 10*3/uL (ref 0.7–4.0)
MCH: 33.4 pg (ref 26.0–34.0)
MCHC: 33.7 g/dL (ref 30.0–36.0)
MCV: 99.1 fL (ref 80.0–100.0)
Monocytes Absolute: 0.5 10*3/uL (ref 0.1–1.0)
Monocytes Relative: 11 %
Neutro Abs: 2.7 10*3/uL (ref 1.7–7.7)
Neutrophils Relative %: 67 %
Platelets: 198 10*3/uL (ref 150–400)
RBC: 3.41 MIL/uL — ABNORMAL LOW (ref 4.22–5.81)
RDW: 14.6 % (ref 11.5–15.5)
WBC: 4 10*3/uL (ref 4.0–10.5)
nRBC: 0 % (ref 0.0–0.2)

## 2023-11-23 LAB — AMMONIA: Ammonia: 55 umol/L — ABNORMAL HIGH (ref 9–35)

## 2023-11-23 LAB — COMPREHENSIVE METABOLIC PANEL
ALT: 23 U/L (ref 0–44)
AST: 39 U/L (ref 15–41)
Albumin: 2.8 g/dL — ABNORMAL LOW (ref 3.5–5.0)
Alkaline Phosphatase: 109 U/L (ref 38–126)
Anion gap: 10 (ref 5–15)
BUN: 11 mg/dL (ref 6–20)
CO2: 20 mmol/L — ABNORMAL LOW (ref 22–32)
Calcium: 9.3 mg/dL (ref 8.9–10.3)
Chloride: 105 mmol/L (ref 98–111)
Creatinine, Ser: 0.57 mg/dL — ABNORMAL LOW (ref 0.61–1.24)
GFR, Estimated: >60 mL/min (ref >60)
Glucose, Bld: 126 mg/dL — ABNORMAL HIGH (ref 70–99)
Potassium: 4.2 mmol/L (ref 3.5–5.1)
Sodium: 135 mmol/L (ref 135–145)
Total Bilirubin: 4 mg/dL — ABNORMAL HIGH (ref <1.2)
Total Protein: 8.1 g/dL (ref 6.5–8.1)

## 2023-11-23 NOTE — Progress Notes (Signed)
Physical Therapy Session Note  Patient Details  Name: Brian [name] MRN: [id number] Date of Birth: [birth year]  Today's Date: 11/23/2023 (Late Entry for 11/22/23) PT Individual Time: 1335-1435 PT Individual Time Calculation (min): 60 min   Short Term Goals: Week 2:  PT Short Term Goal 1 (Week 2): Pt will ambulate 72' with CGA with LRAD PT Short Term Goal 2 (Week 2): Pt will consistently complete transfers with CGA with LRAD PT Short Term Goal 3 (Week 2): Pt will complete up/down 5" curb with min assist with LRAD  Skilled Therapeutic Interventions/Progress Updates:  Patient seen for lymphedema measurements and wrapping.  Patient reports wraps were tighter and noted some areas of errythema R shin, denies pain, RN addressing area of deep tissue injury R heel and WOC RN in to assess during session.  Patient assisted to supine with R present with CGA and cues. Re-wrapped with stockinette, artiflex, comprilan and molelast to toes. Measurements as follows:                                                  L                                              R               Great Toe                    13 cm                                       12 cm             MTP's                          30.0 cm                                    31.0 cm             10 cm up from MTP    31.5 cm                                    31.0 cm             At malleoli                   39.8 cm                                    30.8 cm                        10 cm up from mall     27.4 cm  28.7 cm             10 cm up                     32.5 cm                                   37.0 cm                        10 cm up                     41.0 cm                                   45.5 cm  Measurements fluctuating, will follow up Friday and likely refer for outpatient lymphedema management.  Pt relates can attend at [facility] for follow up.        Therapy  Documentation Precautions:  Precautions Precautions: Fall Precaution Comments: incontinence Restrictions Weight Bearing Restrictions: No  Pain: Pain Assessment Pain Scale: 0-10 Pain Score: 0-No pain    Therapy/Group: Individual Therapy  Elray Mcgregor 11/23/2023, 9:47 AM  Sheran Lawless, PT Acute Rehabilitation Services Office:[phone] 11/23/2023

## 2023-11-23 NOTE — Progress Notes (Signed)
Speech Language Pathology Daily Session Note  Patient Details  Name: Brian [name] MRN: [id number] Date of Birth: 03/30/1961  Today's Date: 11/23/2023 SLP Individual Time: 1030-1100 SLP Individual Time Calculation (min): 30 min  Short Term Goals: Week 2: SLP Short Term Goal 1 (Week 2): Patient will orient to date, time, situation, and person given external aids and supervision verbal cues. SLP Short Term Goal 2 (Week 2): Patient will utilize external and internal memory aids to recall daily information with supervision verbal cues. SLP Short Term Goal 3 (Week 2): Patient will state two cognitive and two physical impairments demonstrating intellectual awareness given min assist. SLP Short Term Goal 4 (Week 2): Patient will solve basic environmental problems in 4/5 opportunities given min assist.  Skilled Therapeutic Interventions: SLP conducted skilled therapy session targeting family education and cognitive retraining goals. Son present throughout session to observe lingering cognitive deficits and strategies utilized by SLP to assist. Conduced basic finance management task. Patient completed functional money calculations with supervision-min assist to determine change needed back from cash provided by SLP. Patient then gathered and presented SLP with correct change given overall min assist. Patient oriented to date with supervision this session. Patient was left in chair with call bell in reach and chair alarm set. SLP will continue to target goals per plan of care.       Pain Pain Assessment Pain Scale: 0-10 Pain Score: 0-No pain  Therapy/Group: Individual Therapy  Jeannie Done, M.A., CCC-SLP  [name] 11/23/2023, 12:43 PM

## 2023-11-23 NOTE — Progress Notes (Signed)
Occupational Therapy Session Note  Patient Details  Name: Brian [name] MRN: [id number] Date of Birth: 12-28-1960  Today's Date: 11/23/2023 OT Individual Time: 8295-6213 OT Individual Time Calculation (min): 57 min    Short Term Goals: Week 3:  OT Short Term Goal 1 (Week 3): STG=LTG d/t ELOS  Skilled Therapeutic Interventions/Progress Updates:     Pt received sitting up in wc upon OT arrival. Pt presenting to be in good spirits receptive to skilled OT session reporting 0/10 pain- OT offering intermittent rest breaks, repositioning, and therapeutic support to optimize participation in therapy session. Focus this session BADL retraining and family education with emphasis on transfer training.   Pt's brief noted to be soiled upon OT arrival with Pt receptive to getting cleaned up. Engaged Pt in LB bathing standing at sink for increased balance and endurance challenge. Pt able to complete LB bathing and anterior/posterior peri-care with close supervision using RW for balance maintaining standing balance >10 minutes with seated rest break provided following. Re-educated Pt on modified Lb dressing techniques with Pt able to weave B LEs with increased time when giving maximal effort with assistance only to adjust pants over heel of L foot. Pt stood with supervision and brought pants to waist without LOB.   Pt's son present fro remiander of therapy session. Provided education on d/c plans, need for intermittent supervision, updates on Pt's current functional status, energy conservation, and fall prevention. Educated on gait belt use and technique for assisting Pt with TTB transfers, toilet transfers, and functional mobility with Pt's son demonstrating teach back as evidence of learning. Pt's son able to safely assist Pt through transfers and functional mobility with OT providing skilled feedback on technique and positioning to support increased safety. Education provided on BR safety and recommendations  for TTB, HHSH, and grab bars. Pt and Pt's son receptive to all education provided and verbalizing understanding at end of session. Provided Pt and Pt's son with written summary of education provided to support learning. Pt was left resting in reach with call bell in reach, seatbelt alarm on, and all needs met.    Therapy Documentation Precautions:  Precautions Precautions: Fall Precaution Comments: incontinence Restrictions Weight Bearing Restrictions: No   Therapy/Group: Individual Therapy  Clide Deutscher 11/23/2023, 12:45 PM

## 2023-11-23 NOTE — Progress Notes (Signed)
PROGRESS NOTE   Subjective/Complaints: No acute complaints.  No events overnight.  Seen today with son at bedside for family training.  All questions answered, no concerns. He is reexamined with lymphedema wrapping yesterday, wound care consult placed for outpatient recommendations given persistence of right heel DTI.  Recommendations as below.  Did discuss ongoing wound care as an outpatient with the patient and his son to reinforce the need for vigilance with the right heel. Vitals stable Last bowel movement overnight, large, incontinent but urinary continence overnight much improved.  ROS: + bowel incontinence, -improved, ongoing  bladder incontinence -mostly resolved Bilateral lower extremity swelling-stable with lymphedema wraps +insomnia-resolved  denies fevers, chills, N/V, abdominal pain, constipation, , SOB, cough, chest pain, headache, new weakness or new sensory changes or paraesthesias.    Objective:   No results found. No results for input(s): "WBC", "HGB", "HCT", "PLT" in the last 72 hours.   No results for input(s): "NA", "K", "CL", "CO2", "GLUCOSE", "BUN", "CREATININE", "CALCIUM" in the last 72 hours.    Intake/Output Summary (Last 24 hours) at 11/23/2023 1012 Last data filed at 11/23/2023 0801 Gross per 24 hour  Intake 480 ml  Output 1275 ml  Net -795 ml     Pressure Injury 11/08/23 Heel Right Deep Tissue Pressure Injury - Purple or maroon localized area of discolored intact skin or blood-filled blister due to damage of underlying soft tissue from pressure and/or shear. (Active)  11/08/23 1600  Location: Heel  Location Orientation: Right  Staging: Deep Tissue Pressure Injury - Purple or maroon localized area of discolored intact skin or blood-filled blister due to damage of underlying soft tissue from pressure and/or shear.  Wound Description (Comments):   Present on Admission: Yes    Physical  Exam: Vital Signs Blood pressure (!) 146/76, pulse 93, temperature 98.5 F (36.9 C), resp. rate 18, height 5\' 11"  (1.803 m), weight 122.3 kg, SpO2 93%.  Constitutional: No apparent distress. Appropriate appearance for age.  Morbidly obese.  Sitting up in chair. HENT: EOMI. NCAT. Cardiovascular: RRR, no murmurs/rub/gallops. 2-3+ bilateral pitting and lymphedema -wrapped bilaterally-stable Respiratory: CTAB. No rales, rhonchi, or wheezing.  Abdomen: + bowel sounds hypoactive,  Mildly distended, no  tenderness on palpation. Skin:  + Bilateral chronic lower extremity skin thickening and discolorations; obscured now by lymphedema wraps + Bilateral buttocks and coccygeal MASD-not examined + Right heel deep tissue injury, unstageable, with underlying bruise-see photos 12-4, stable appearance with skin softening over top  + Left heel bruising seen on photos yesterday           MSK:     No joint deformity noted.  Full active range of motion all 4 extremities.  Neurologic exam:  Cognition: Alert and awake.  Oriented x 3..  Follows basic commands out of 3. + Cognitive slowing, memory deficits-ongoing but with significant improvement since admission-likely at baseline  Strength: Antigravity and against resistance in all 4 extremities, 5- out of 5 proximally, 5 out of 5 distally-unchanged Mood: Pleasant affect, appropriate mood.  Sensation: Grossly intact.  Assessment/Plan: 1. Functional deficits which require 3+ hours per day of interdisciplinary therapy in a comprehensive inpatient rehab setting. Physiatrist is providing close team supervision  and 24 hour management of active medical problems listed below. Physiatrist and rehab team continue to assess barriers to discharge/monitor patient progress toward functional and medical goals  Care Tool:  Bathing    Body parts bathed by patient: Right arm, Left arm, Chest, Abdomen, Front perineal area, Right upper leg, Buttocks, Left upper  leg, Right lower leg, Left lower leg, Face   Body parts bathed by helper: Front perineal area, Buttocks, Right upper leg, Left upper leg, Right lower leg, Left lower leg     Bathing assist Assist Level: Minimal Assistance - Patient > 75%     Upper Body Dressing/Undressing Upper body dressing   What is the patient wearing?: Pull over shirt    Upper body assist Assist Level: Set up assist    Lower Body Dressing/Undressing Lower body dressing      What is the patient wearing?: Pants, Incontinence brief     Lower body assist Assist for lower body dressing: Maximal Assistance - Patient 25 - 49%     Toileting Toileting    Toileting assist Assist for toileting: Maximal Assistance - Patient 25 - 49%     Transfers Chair/bed transfer  Transfers assist  Chair/bed transfer activity did not occur: Safety/medical concerns (Unsafet to get out of bed.)  Chair/bed transfer assist level: Contact Guard/Touching assist     Locomotion Ambulation   Ambulation assist   Ambulation activity did not occur: Safety/medical concerns  Assist level: Contact Guard/Touching assist Assistive device: Walker-rolling Max distance: 45'   Walk 10 feet activity   Assist  Walk 10 feet activity did not occur: Safety/medical concerns  Assist level: Contact Guard/Touching assist Assistive device: Walker-rolling   Walk 50 feet activity   Assist Walk 50 feet with 2 turns activity did not occur: Safety/medical concerns         Walk 150 feet activity   Assist Walk 150 feet activity did not occur: Safety/medical concerns         Walk 10 feet on uneven surface  activity   Assist Walk 10 feet on uneven surfaces activity did not occur: Safety/medical concerns         Wheelchair     Assist Is the patient using a wheelchair?: Yes Type of Wheelchair: Manual    Wheelchair assist level: Moderate Assistance - Patient 50 - 74% Max wheelchair distance: 73'    Wheelchair 50  feet with 2 turns activity    Assist        Assist Level: Moderate Assistance - Patient 50 - 74%   Wheelchair 150 feet activity     Assist      Assist Level: Maximal Assistance - Patient 25 - 49%   Blood pressure (!) 146/76, pulse 93, temperature 98.5 F (36.9 C), resp. rate 18, height 5\' 11"  (1.803 m), weight 122.3 kg, SpO2 93%.  Medical Problem List and Plan: 1. Functional deficits secondary to acute encephalopathy, hepatic and possible metabolic component             -patient may shower             -ELOS/Goals: 10-14 days SPV, downgrade to CGA PT 12/3 - DC 11/28/23              - Stable to continue CIR  -11/26: Wife not returning calls per SLP. Remains Max A for LBD, min-mod transfers, walking 35 feet with RW Mod A with +2 WC follow. Min A for basic problem solving, Mod A for memory and awareness.  12/3: Got ahold of wife, she is unable to provide any physical assistance and no one in the home for PSV assist. Needs Stedy for most transfers; CGA to Max A for toiletting depending on session. Min A bathing. PT CGA-Min A transfers with RW, walking up to 45 feet; did a 3 inch step yesterday with Min A. SLP Min A for problem solving, Mod for complex - feel he is close to baseline for cog.   2.  Antithrombotics: -DVT/anticoagulation:  Pharmaceutical: Lovenox             -antiplatelet therapy: N/A 3.Left knee pain: XR ordered, voltaren gel ordered   - 11/21: No complaints today. 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  May benefit from hinged knee brace when weightbearing, or injection if medically stable.  Monitor today for therapy assessments.  - 11/22: With PT, denied pain during session yesterday with ambulation and transfers. No adjustments at this time.   4. Mood/Behavior/Sleep: Provide emotional support             -antipsychotic agents: N/A  11-26: See below, applying overnight pulse ox to screen for OSA -approximately 4 events  per hour, lowest desat in the 80s, not diagnostic of OSA.  Will not pursue aggressively water. -11/30 PRN melatonin  -12/1 sleeping better last night, continue current Insomnia resolved  5. Neuropsych/cognition: This patient is capable of making decisions on his own behalf.  6. Skin/Wound Care: Routine skin checks..Excoriation of buttocks with wound care as directed -Low-air-loss mattress, Mepilex dressing to bilateral heels to prevent breakdown--add Prevalon boot RLE 11/22; never applied, re-ordered 11/27 bilaterally d/t boggy heels  -Goodhart's Butt cream to prevent worsening MASD -Hydrocyn cream to bilateral legs for flaking, dry skin -11-23: Nursing order to increase cream to twice daily, over legs and feet, and to apply Ace wrapping from feet to mid shin to prevent worsening dependent edema. 11-24: Much improved appearance of bilateral lower extremities, continue regimen of wrapping and hydrating cream. 11-26: Lymphedema wraps now on.  Concern regarding ongoing monitoring of right heel DTI with these wraps; prevalon boots as above.  11/27 discussed reason for Prevalon boot use-patient has been compliant  -12/5: Per WOCN assessment, agree R heel DTI will likely evolve into full-thickness ulcer once skin softens. For home management, Apply Xeroform gauze to right heel wound Q day and cover with foam dressing.  Change foam dressing Q 3 days or PRN soiling.  Use Prevalon boot to reduce pressure to heel when in bed.     7. Fluids/Electrolytes/Nutrition: Routine in and outs with follow-up chemistries  -11-21: Sodium 133, repeat in a.m. for trend.  Otherwise labs stable.  11-22: Improved 134 today; next labs on Monday, monitor for cognitive changes  -Sodium up to 136 on 11/25  -12-2: Sodium 139; other labs stable.  P.o. intake adequate.  Monitor.  12-5: Labs, ammonia pending  8.  Colitis versus ileitis-acute appendicitis ruled out.  Follow-up outpatient general surgery  11-24: Remains with  frequent loose bowel movements overnight.  Lactulose held/as needed.  Will need to continue targeted bowel movements at least 2 to 3/day, however did add Imodium for excessive bowel movements.  No current concern for infection, vital stable and no white count.  11-25/26: Unchanged, ongoing.  Questional contribution of cognitive deficits.  Bowel movements do seem to be slowing down.  12-2-3: Once daily liquid to soft bowel movements, remain incontinent - resolved  9.  Hypertension/hypotension.  Blood pressure remains a bit soft  remains on ProAmatine 10 mg 3 times daily as well as low-dose Coreg 12.5 mg twice daily.   - 11/21: Diastolic hypotension as below; resume midodrine 5 mg 2 times daily and wean as tolerated -stable 11-23  11/29 BP  well-controlled, continue current regimen.  Continue midodrine  12/1 BP controlled, continue current regimen  12-2: Has remained normotensive in mid 30s; decrease midodrine to 2.5 mg twice daily, monitor 1 to 2 days before discontinuing  12-3: Has remained normotensive, discontinue midodrine  12/4-5: Stable/normotensive off of midodrine.     11/23/2023    6:36 AM 11/22/2023    8:18 PM 11/22/2023    4:07 PM  Vitals with BMI  Weight 269 lbs 10 oz    BMI 37.62    Systolic 146 133 829  Diastolic 76 69 68  Pulse 93 79 77    10.  Chronic lymphedema.  Follow-up outpatient.  -PT order for lymphedema wrapping and assistance and education, management 11-22 -11-23: Per PT, only 1 PT in the hospital provide lymphedema management, looking into getting them in the room to assist in education this coming week -11/24 nursing providing Ace wrapping to bilateral lower extremities, does appear to be helping. 11/25 reports gradually improving, continue current regimen 11-26: PT Elray Mcgregor applied lymphedema wraps and educated patient on care 11-25; very much appreciate her help in this!  Patient comfortable, continue current management; PT to follow-up for tolerance and  rewrap as needed and follow measurements until stable. 12/1 PT reports improving, continue current regimen -lymphedema wraps to be redone 12-2, with ongoing improvement. 12-3: Per PT, could benefit from toenail cutting for wrapping, unfortunately not available in house.  Discussed with patient outpatient follow-up for toenail management.  11.History of alcohol use.  Counseling 12.  Tobacco use.  Counseling 13.  Obesity.  BMI 37.08.  Dietary follow-up 14.  Prediabetes.  Hemoglobin A1c 5.8. d/c ISS and start metformin ER release with dinner, creatinine reviewed and is stable -Blood sugars well-controlled, DC BG checks  11/25 glucose stable on BMP  15.  BPH/bladder incontinence/bowel incontinence.  Flomax 0.4 mg daily. BP reviewed and is stable   -11-23: DC Flomax, continue timed toileting and PVRs  11-24: PVRs remain low, remains continent during the day and incontinent at night.  Continue timed toileting.  11-26: Uncertain what barriers to continence are at this time, patient states that he sleeps through episodes of incontinence although he does feel the urge to go and knows to call the nurse for assistance.  Labs stable as above.  Will apply overnight pulse ox to assess for OSA/disordered sleep.  11-27: OSA evaluation nondiagnostic as above.  Incontinence that seems to be improving as bowel movements slow down.  Continue timed toileting and monitoring.  11/30 Occasional incontinence,  recheck PVR- 193, 220. Will retry flomax  12-2: Ongoing incontinence, PVRs mid 100s.  Continue Flomax  12/3: PVRs stable, DC   12-4: Some improved continence overnight! Ongoing 12/5   16. Hyperammonemia.  Hepatic steatosis seen on intake CT.  - 11/21: Seems more encephalopathic today, last ammonia was increased from 40s to 60s, add on to a.m. labs and resume lactulose 7.5 mg twice daily if worsening  11-22: Ammonia within normal limits.  Continues having 4-5 stools per day.  Change lactulose to 10 mg twice daily  as needed to target at least 3 stools per day.  Cognition does seem to be improving.  11-30 continue current dose lactulose 10g BID PRN  12/4: Some mild cognitive  deficits this a.m., which resolved quickly with improved arousal and stimulation.  Repeat labs, ammonia in AM - pending  12-5: Per SLP, cognition improved back to baseline this a.m.  Labs pending as above.  17. Anemia.  hemoglobin decreased on intake from 12,6 to 10.7.  FOBT, H&H in AM. -11-22: Hemoglobin improved to 11.4, FOBT pending but no obvious blood in stool. - FOBT negative  - Hemoglobin stable 12-2 - recheck 12/5  LOS: 15 days A FACE TO FACE EVALUATION WAS PERFORMED  Angelina Sheriff 11/23/2023, 10:12 AM

## 2023-11-23 NOTE — Progress Notes (Signed)
Physical Therapy Session Note  Patient Details  Name: Brian [name] MRN: [id number] Date of Birth: [birth year]  Today's Date: 11/23/2023 PT Individual Time: 1102-1202 + [id number] PT Individual Time Calculation (min): 60 min +  Short Term Goals: Week 2:  PT Short Term Goal 1 (Week 2): Pt will ambulate 34' with CGA with LRAD PT Short Term Goal 2 (Week 2): Pt will consistently complete transfers with CGA with LRAD PT Short Term Goal 3 (Week 2): Pt will complete up/down 5" curb with min assist with LRAD  Skilled Therapeutic Interventions/Progress Updates:     SESSION 1: Pt presents in room in [facility] with son present for family education, pt denies pain and agreeable to PT. Session focused on gait training, therapeutic activity for education on CLOF and DC expectations, and therapeutic exercise for HEP prescription.  Pt transported to ortho gym dependently for time management. Pt ambulates ~50' from [facility] up/down ramp with pt demonstrating heavy UE reliance on RW with device snagging on threshold up ramp and down ramp with x1 significant LOB, pt son guarding and able to correct pt's balance with mod assist. Pt returns to sitting after gait training and pt and pt son educated on floor hazards in home such as thresholds and rugs to remove prior to DC.  Pt then completes ambulatory transfer to car mod assist for anterior wt shift and gluteal clearance to lowered car height to simulate car pt will be discharging home with.  Pt transported to main gym completes up/down 3" step with BHRs x10 ascending forwards with RLE and backwards with RLE. Completed as therapeutic exercise to promote RLE strengthening needed for stair negotiation to manage threshold step at home. Per conversation with pt son pt will only have to negotiate 2" threshold at DC.  Pt ambulates 95' with RW CGA with son guarding no LOB, heavy reliance on RW with cues to correct.  Pt completes therapeutic exercise to complete as HEP as  DC, provided with handout and completes one set of the follow exercises with RW for UE support and CGA:  Access Code: [email or web address] URL: [email or web address] Date: 11/23/2023 Prepared by: Edwin Cap  Exercises - Sit to Stand with Counter Support  - 1 x daily - 7 x weekly - 1-2 sets - 10 reps - Standing March with Counter Support  - 1 x daily - 7 x weekly - 1-2 sets - 20 reps - Standing Knee Flexion AROM with Chair Support  - 1 x daily - 7 x weekly - 1-2 sets - 10 reps - Mini Squats with Walker and Chair  - 1 x daily - 7 x weekly - 1-2 sets - 10 reps - Seated Heel Raise  - 3 x daily - 7 x weekly - 1-2 sets - 10 reps  Pt begins ambulating back to room with son CGA with RW, one instance knee buckle no LOB. Pt ambulates ~30' however demonstrates poor fit of pants and brief, comes to sitting in [facility] and transported back to room dependently. Pt comes to standing for managing brief and pants, therapist manages clothing with max assist, pt maintains BUE and unilateral UE support on RW with supervision for ~2 minutes. Pt remains seated with all needs within reach, call light in place, and chair alarm activated at end of session.    SESSION 2: Pt presents in room in [facility], agreeable to PT. Pt denies pain at this time. Session focused on gait training for tolerance to upright and device management as well  as therapeutic exercise for BLE strengthening and activity tolerance. Pt completes all transfers with CGA/close supervision with RW.  Pt transported via WC to main gym. Pt ambulates with RW 190' with CGA, heavy UE reliance on RW, noted with noise from RW while ambulating. Pt provided with verbal cues for decreasing UE support, using noise from RW as auditory cue and pt completing 3x95' gait trials with CGA, increased instance of bilateral knee buckling but no LOB. Pt requires seated rest breaks between gait trials for recovery. During rest breaks pt educated on decreasing UE support on RW as well  as education on R heel wound and wound care expectations at DC with pt verbalizing understanding.  Pt completes therapeutic exercise for BLE strengthening and actiivty tolerance including: - step taps 6" step 2x10 alternating BLE (pt unable to place RLE on step with first trial, able to with second trial however demonstrating increased effort, cues for breathing) - continuous training 8 min total on nustep workload 7 (first 5 min BLEs only maintains 45 spm, last 3 min BUE/BLE maintains 70 spm)  Pt returns to room and remains seated in [facility] with all needs within reach, cal light in place and chair alarm donned and activated at end of session.   Therapy Documentation Precautions:  Precautions Precautions: Fall Precaution Comments: incontinence Restrictions Weight Bearing Restrictions: No    Therapy/Group: Individual Therapy  Edwin Cap PT, DPT 11/23/2023, 12:12 PM

## 2023-11-23 NOTE — Plan of Care (Signed)
  Problem: Consults Goal: RH GENERAL PATIENT EDUCATION Description: See Patient Education module for education specifics. Outcome: Progressing Goal: Skin Care Protocol Initiated - if Braden Score 18 or less Description: If consults are not indicated, leave blank or document N/A Outcome: Progressing   Problem: RH BOWEL ELIMINATION Goal: RH STG MANAGE BOWEL WITH ASSISTANCE Description: STG Manage Bowel with min Assistance. Outcome: Progressing Goal: RH STG MANAGE BOWEL W/MEDICATION W/ASSISTANCE Description: STG Manage Bowel with Medication with min Assistance. Outcome: Progressing   Problem: RH BLADDER ELIMINATION Goal: RH STG MANAGE BLADDER WITH ASSISTANCE Description: STG Manage Bladder With min Assistance Outcome: Progressing   Problem: RH SKIN INTEGRITY Goal: RH STG SKIN FREE OF INFECTION/BREAKDOWN Description: MASD will improve and skin will be free of infection without additional breakdown with min assist  Outcome: Progressing Goal: RH STG MAINTAIN SKIN INTEGRITY WITH ASSISTANCE Description: STG Maintain Skin Integrity With min Assistance. Outcome: Progressing Goal: RH STG ABLE TO PERFORM INCISION/WOUND CARE W/ASSISTANCE Description: STG Able To Perform Incision/Wound Care With min Assistance. Outcome: Progressing   Problem: RH SAFETY Goal: RH STG ADHERE TO SAFETY PRECAUTIONS W/ASSISTANCE/DEVICE Description: STG Adhere to Safety Precautions With min Assistance/Device. Outcome: Progressing Goal: RH STG DECREASED RISK OF FALL WITH ASSISTANCE Description: STG Decreased Risk of Fall With cueing Assistance. Outcome: Progressing   Problem: RH PAIN MANAGEMENT Goal: RH STG PAIN MANAGED AT OR BELOW PT'S PAIN GOAL Description: Less than 2 with PRN medications min assist  Outcome: Progressing   Problem: RH KNOWLEDGE DEFICIT GENERAL Goal: RH STG INCREASE KNOWLEDGE OF SELF CARE AFTER HOSPITALIZATION Description: Patient/caregiver will be able to manage medications, self care,  diet/lifestyle modifications to improve overall health from nursing education, nursing handouts and other resources independently  Outcome: Progressing   Problem: Education: Goal: Ability to describe self-care measures that may prevent or decrease complications (Diabetes Survival Skills Education) will improve Outcome: Progressing Goal: Individualized Educational Video(s) Outcome: Progressing   Problem: Coping: Goal: Ability to adjust to condition or change in health will improve Outcome: Progressing   Problem: Fluid Volume: Goal: Ability to maintain a balanced intake and output will improve Outcome: Progressing   Problem: Health Behavior/Discharge Planning: Goal: Ability to identify and utilize available resources and services will improve Outcome: Progressing Goal: Ability to manage health-related needs will improve Outcome: Progressing   Problem: Metabolic: Goal: Ability to maintain appropriate glucose levels will improve Outcome: Progressing   Problem: Nutritional: Goal: Maintenance of adequate nutrition will improve Outcome: Progressing Goal: Progress toward achieving an optimal weight will improve Outcome: Progressing   Problem: Skin Integrity: Goal: Risk for impaired skin integrity will decrease Outcome: Progressing   Problem: Consults Goal: RH GENERAL PATIENT EDUCATION Description: See Patient Education module for education specifics. Outcome: Progressing

## 2023-11-24 ENCOUNTER — Other Ambulatory Visit (HOSPITAL_COMMUNITY): Payer: Self-pay

## 2023-11-24 NOTE — Progress Notes (Signed)
Occupational Therapy Session Note  Patient Details  Name: Brian [name] MRN: [id number] Date of Birth: [birth year]  Today's Date: 11/24/2023 OT Individual Time: 802-845 1st Session; 1345-1445 2nd Session  OT Individual Time Calculation (min): 43 min, 60 min    Short Term Goals: Week 2:  OT Short Term Goal 1 (Week 2): Patient will complete 1 step of toileting task OT Short Term Goal 1 - Progress (Week 2): Met OT Short Term Goal 2 (Week 2): Patient will complete toilet transfer with CGA OT Short Term Goal 2 - Progress (Week 2): Met OT Short Term Goal 3 (Week 2): Patient will maintain standing at the sink for 3 minutes in preparation for BADL tasks. OT Short Term Goal 3 - Progress (Week 2): Met  Skilled Therapeutic Interventions/Progress Updates:   Session 1:  Pt seen for skilled OT session this am. Planned together for shower during later session but pt requesting face washing and oral care sink side. Pt stood for both from w/c level with seated rest break x 3 with reliance on unilateral UE support for balance. Pt educated on energy conservation and breathing integration with teach back with min cues during standing tasks. OT issued and instructed in yellow tband due to R shoulder issues for triceps press. Pt completed B ly 10 reps x 3 sets with min cues for technique. Left pt w/c level with chair alarm set, needs and nurse call button in reach.   Pain: mild c/o pain B LE's L>R   Session 2:  Pt seen for skilled OT session with focus on full toileitng and shower routine transfers, mobility and LE functional reach. OT waterproofed B LE wraps prior to mobility with Rw to toilet for bowel and voiding with min a. Transfer to TTB in stall shower and pt use LH sponge for back access. OT provided min a for buttocks standing with grab bars. UB and hair washing with set up. Throughout education provided for falls prevention, energy conservation and functional reach strategies. Once back in w/c for  UB dressing and grooming with set up and min a for pants donning and total A for slipper socks over wraps. Left pt w/c level with all needs, nurse call button and needs in reach.   Pain: 2/10 B LE's R > L with relief with warm shower, LE elevation and repositioning  Therapy Documentation Precautions:  Precautions Precautions: Fall Precaution Comments: incontinence Restrictions Weight Bearing Restrictions: No  Therapy/Group: Individual Therapy  Vicenta Dunning 11/24/2023, 7:57 AM

## 2023-11-24 NOTE — Progress Notes (Signed)
Speech Language Pathology Weekly Progress and Session Note  Patient Details  Name: Brian [name] MRN: [id number] Date of Birth: 1961-12-16  Beginning of progress report period: November 17, 2023 End of progress report period: November 24, 2023  Today's Date: 11/24/2023 SLP Individual Time: 1001-1100 SLP Individual Time Calculation (min): 59 min  Short Term Goals: Week 2: SLP Short Term Goal 1 (Week 2): Patient will orient to date, time, situation, and person given external aids and supervision verbal cues. SLP Short Term Goal 1 - Progress (Week 2): Met SLP Short Term Goal 2 (Week 2): Patient will utilize external and internal memory aids to recall daily information with supervision verbal cues. SLP Short Term Goal 2 - Progress (Week 2): Met SLP Short Term Goal 3 (Week 2): Patient will state two cognitive and two physical impairments demonstrating intellectual awareness given min assist. SLP Short Term Goal 3 - Progress (Week 2): Met SLP Short Term Goal 4 (Week 2): Patient will solve basic environmental problems in 4/5 opportunities given min assist. SLP Short Term Goal 4 - Progress (Week 2): Met    New Short Term Goals: Week 3: SLP Short Term Goal 1 (Week 3): STGs=LTGs d/t ELOS  Weekly Progress Updates: Patient continues to make excellent gains towards therapy goals this reporting period, meeting 4/4 short term goals set. Patient is currently oriented x4 independently at most recent session and states physical/cognitive deficits with min assist. Patient solves basic environmental problems with supervision-min assist overall and is approaching goal level in concurrence with anticipated discharge date. Patient and family education ongoing. Patient will continue to benefit from skilled therapy services during remainder of CIR stay.     Intensity: Minumum of 1-2 x/day, 30 to 90 minutes Frequency: 3 to 5 out of 7 days Duration/Length of Stay: 12/10 Treatment/Interventions: Cognitive  remediation/compensation;Cueing hierarchy;Functional tasks;Therapeutic Activities;Internal/external aids;Patient/family education   Daily Session  Skilled Therapeutic Interventions: SLP conducted skilled therapy session targeting cognitive retraining goals. Patient completed various cognitive tasks testing basic problem solving skills and functional task completion/organization. Patient oriented x4 independently this date. During cognitive tasks, required overall min assist for basic problem solving during first task and supervision-min during functional calendar organization task. Patient was left in chair with call bell in reach and chair alarm set. SLP will continue to target goals per plan of care.       Pain Pain Assessment Pain Scale: 0-10 Pain Score: 0-No pain  Therapy/Group: Individual Therapy  Jeannie Done, M.A., CCC-SLP  [name] 11/24/2023, 10:43 AM

## 2023-11-24 NOTE — Progress Notes (Addendum)
Patient ID: Brian [name], male   DOB: 02-20-1961, 62 y.o.   MRN: [id number]  SW left message for pt wife [name] to follow-up about DME rec of TTB. SW requested return phone call if they do not have one, and SW will explain charity program. SW also informed will place referral for charity [facility] on his date of discharge. SW waiting on follow-up.  Pt Setup for [facility] medication assistance program.   *SW received return phone call from pt wife to discuss above. SW will order TTB with [employer] through charity DME. SW informed on attempt will be made for charity [facility], if not approved, pt will still be sent home with HEP. Wife confirms that pt has phone interview with [facility] next week to discuss disability.   Cecile Sheerer, MSW, LCSW Office: [phone] Cell: [phone] Fax: [phone]

## 2023-11-24 NOTE — Progress Notes (Signed)
Physical Therapy Weekly Progress Note  Patient Details  Name: Brian [name] MRN: [id number] Date of Birth: [birth year]  Beginning of progress report period: November 17, 2023 End of progress report period: November 24, 2023  Today's Date: 11/24/2023 PT Individual Time: 0920-1004 PT Individual Time Calculation (min): 44 min   Patient has met 2 of 3 short term goals.  Pt making good progress towards functional goals. Pt currently requires CGA/min assist overall, CGA for transfers with RW, CGA ambulation 175', up/down 6" step with BHRs min assist. Family education completed this week with pt and pt son who actively participate during session and is independent to provide assistance needed at DC.  Patient continues to demonstrate the following deficits muscle weakness, decreased cardiorespiratoy endurance, and decreased standing balance, decreased postural control, and decreased balance strategies and therefore will continue to benefit from skilled PT intervention to increase functional independence with mobility.  Patient progressing toward long term goals..  Plan of care revisions: goals upgraded to reflect pt progress.  PT Short Term Goals Week 2:  PT Short Term Goal 1 (Week 2): Pt will ambulate 6' with CGA with LRAD PT Short Term Goal 1 - Progress (Week 2): Met PT Short Term Goal 2 (Week 2): Pt will consistently complete transfers with CGA with LRAD PT Short Term Goal 2 - Progress (Week 2): Met PT Short Term Goal 3 (Week 2): Pt will complete up/down 5" curb with min assist with LRAD PT Short Term Goal 3 - Progress (Week 2): Progressing toward goal Week 3:  PT Short Term Goal 1 (Week 3): STG = LTG due to ELOS  Skilled Therapeutic Interventions/Progress Updates:    Pt presents in room in [facility], agreeable to PT, denies pain. Session focused on gait training for upright tolerance, dynamic standing balance with decreasing UE support on RW, and therapeutic exercise to promote BLE strengthening  needed for functional transfers, gait and stair negotiation.  Pt transported to main gym dependently for time management. Pt amb 190' with RW CGA with verbral cues to decrease UE support on RW, pt demonstrating improved upright posture and knee stability with increased distance ambulating.  Pt completes step taps 6" step x14 reps, pt able to place full foot on step bilaterally with each rep, completed to promote BLE strengthening needed for stair negotiation and ambulation. Pt completes step ups 6" step 3x3 reps with RLE ascending with pt demonstrating significant knee/trunk flexion during exercise but no knee buckling or LOB.  Pt transported to day room and completes interval training on kinetron 20cm/sec work/1 min rest x47min with pt provided with cues to complete full depth and ROM throughout exercise.  Pt returns to room and remains seated in [facility] with all needs within reach, cal light in place and chair alarm donned and activated at end of session.  Therapy Documentation Precautions:  Precautions Precautions: Fall Precaution Comments: incontinence Restrictions Weight Bearing Restrictions: No  Therapy/Group: Individual Therapy  Edwin Cap PT, DPT 11/24/2023, 1:54 PM

## 2023-11-24 NOTE — Plan of Care (Signed)
  Problem: Consults Goal: RH GENERAL PATIENT EDUCATION Description: See Patient Education module for education specifics. Outcome: Progressing Goal: Skin Care Protocol Initiated - if Braden Score 18 or less Description: If consults are not indicated, leave blank or document N/A Outcome: Progressing   Problem: RH BOWEL ELIMINATION Goal: RH STG MANAGE BOWEL WITH ASSISTANCE Description: STG Manage Bowel with min Assistance. Outcome: Progressing Goal: RH STG MANAGE BOWEL W/MEDICATION W/ASSISTANCE Description: STG Manage Bowel with Medication with min Assistance. Outcome: Progressing   Problem: RH BLADDER ELIMINATION Goal: RH STG MANAGE BLADDER WITH ASSISTANCE Description: STG Manage Bladder With min Assistance Outcome: Progressing   Problem: RH SKIN INTEGRITY Goal: RH STG SKIN FREE OF INFECTION/BREAKDOWN Description: MASD will improve and skin will be free of infection without additional breakdown with min assist  Outcome: Progressing Goal: RH STG MAINTAIN SKIN INTEGRITY WITH ASSISTANCE Description: STG Maintain Skin Integrity With min Assistance. Outcome: Progressing Goal: RH STG ABLE TO PERFORM INCISION/WOUND CARE W/ASSISTANCE Description: STG Able To Perform Incision/Wound Care With min Assistance. Outcome: Progressing   Problem: RH SAFETY Goal: RH STG ADHERE TO SAFETY PRECAUTIONS W/ASSISTANCE/DEVICE Description: STG Adhere to Safety Precautions With min Assistance/Device. Outcome: Progressing Goal: RH STG DECREASED RISK OF FALL WITH ASSISTANCE Description: STG Decreased Risk of Fall With cueing Assistance. Outcome: Progressing   Problem: RH PAIN MANAGEMENT Goal: RH STG PAIN MANAGED AT OR BELOW PT'S PAIN GOAL Description: Less than 2 with PRN medications min assist  Outcome: Progressing   Problem: RH KNOWLEDGE DEFICIT GENERAL Goal: RH STG INCREASE KNOWLEDGE OF SELF CARE AFTER HOSPITALIZATION Description: Patient/caregiver will be able to manage medications, self care,  diet/lifestyle modifications to improve overall health from nursing education, nursing handouts and other resources independently  Outcome: Progressing   Problem: Education: Goal: Ability to describe self-care measures that may prevent or decrease complications (Diabetes Survival Skills Education) will improve Outcome: Progressing Goal: Individualized Educational Video(s) Outcome: Progressing   Problem: Coping: Goal: Ability to adjust to condition or change in health will improve Outcome: Progressing   Problem: Fluid Volume: Goal: Ability to maintain a balanced intake and output will improve Outcome: Progressing   Problem: Health Behavior/Discharge Planning: Goal: Ability to identify and utilize available resources and services will improve Outcome: Progressing Goal: Ability to manage health-related needs will improve Outcome: Progressing   Problem: Metabolic: Goal: Ability to maintain appropriate glucose levels will improve Outcome: Progressing   Problem: Nutritional: Goal: Maintenance of adequate nutrition will improve Outcome: Progressing Goal: Progress toward achieving an optimal weight will improve Outcome: Progressing   Problem: Skin Integrity: Goal: Risk for impaired skin integrity will decrease Outcome: Progressing   Problem: Consults Goal: RH GENERAL PATIENT EDUCATION Description: See Patient Education module for education specifics. Outcome: Progressing

## 2023-11-24 NOTE — Plan of Care (Signed)
  Problem: Sit to Stand Goal: LTG:  Patient will perform sit to stand with assistance level (PT) Description: LTG:  Patient will perform sit to stand with assistance level (PT) Flowsheets (Taken 11/24/2023 1341) LTG: PT will perform sit to stand in preparation for functional mobility with assistance level: Supervision/Verbal cueing Note: Goal upgraded to reflect pt progress   Problem: RH Bed to Chair Transfers Goal: LTG Patient will perform bed/chair transfers w/assist (PT) Description: LTG: Patient will perform bed to chair transfers with assistance (PT). Flowsheets (Taken 11/24/2023 1342) LTG: Pt will perform Bed to Chair Transfers with assistance level: Supervision/Verbal cueing Note: Goal upgraded to reflect pt progress  Problem: RH Wheelchair Mobility Goal: LTG Patient will propel w/c in home environment (PT) Description: LTG: Patient will propel wheelchair in home environment, # of feet with assistance (PT). Outcome: Not Applicable Note: Goal discontinued due to pt goals upgraded to reflect progress with pt anticipated to DC at ambulatory level  Problem: RH Ambulation Goal: LTG Patient will ambulate in controlled environment (PT) Description: LTG: Patient will ambulate in a controlled environment, # of feet with assistance (PT). 11/24/2023 1344 by Edwin Cap, PT Flowsheets (Taken 11/24/2023 1344) LTG: Ambulation distance in controlled environment: 150' Note: Goal upgraded to reflect pt progress  Problem: RH Ambulation Goal: LTG Patient will ambulate in home environment (PT) Description: LTG: Patient will ambulate in home environment, # of feet with assistance (PT). 11/24/2023 1344 by Edwin Cap, PT Flowsheets (Taken 11/24/2023 1344) LTG: Ambulation distance in home environment: 50' Note: Goal upgraded to reflect pt progress

## 2023-11-24 NOTE — Progress Notes (Signed)
PROGRESS NOTE   Subjective/Complaints:  No events overnight.   Continent b/b overnight.  BP much improved, now normotensive.   Seen in room with SLP.  State he is cognitively doing very well today, patient with no acute complaints.  Discussed uptrending ammonia and need to titrate lactulose for multiple bowel movements per day for this.  Nursing informed, patient understanding.  ROS: + bowel incontinence, -improved, ongoing  bladder incontinence -mostly resolved Bilateral lower extremity swelling-stable with lymphedema wraps-ongoing  denies fevers, chills, N/V, abdominal pain, constipation, , SOB, cough, chest pain, headache, new weakness or new sensory changes or paraesthesias.    Objective:   No results found. Recent Labs    11/23/23 1228  WBC 4.0  HGB 11.4*  HCT 33.8*  PLT 198     Recent Labs    11/23/23 1228  NA 135  K 4.2  CL 105  CO2 20*  GLUCOSE 126*  BUN 11  CREATININE 0.57*  CALCIUM 9.3      Intake/Output Summary (Last 24 hours) at 11/24/2023 0926 Last data filed at 11/24/2023 0818 Gross per 24 hour  Intake 480 ml  Output 200 ml  Net 280 ml     Pressure Injury 11/08/23 Heel Right Deep Tissue Pressure Injury - Purple or maroon localized area of discolored intact skin or blood-filled blister due to damage of underlying soft tissue from pressure and/or shear. (Active)  11/08/23 1600  Location: Heel  Location Orientation: Right  Staging: Deep Tissue Pressure Injury - Purple or maroon localized area of discolored intact skin or blood-filled blister due to damage of underlying soft tissue from pressure and/or shear.  Wound Description (Comments):   Present on Admission: Yes    Physical Exam: Vital Signs Blood pressure (!) 115/59, pulse 76, temperature 98.2 F (36.8 C), resp. rate 18, height 5\' 11"  (1.803 m), weight 120.2 kg, SpO2 98%.  Constitutional: No apparent distress. Appropriate  appearance for age.  Morbidly obese.  Sitting at bedside, with SLP. HENT: EOMI. NCAT.  Glasses donned. Cardiovascular: RRR, no murmurs/rub/gallops. 2-3+ bilateral pitting and lymphedema R>L, -wrapped bilaterally Respiratory: CTAB. No rales, rhonchi, or wheezing.  Abdomen: + bowel sounds hypoactive,  Mildly distended, no  tenderness on palpation. Skin:  + Bilateral chronic lower extremity skin thickening and discolorations-stable + Bilateral buttocks and coccygeal MASD-not examined + Right heel deep tissue injury, unstageable, with underlying bruise-see photos 12-4, stable appearance with skin softening over top  + Left heel bruising seen on photos 12/4; mild, obscured by overlying thickened skin           MSK:     No joint deformity noted.  Full active range of motion all 4 extremities.  Neurologic exam:  Cognition: Alert and awake.  Oriented x 3..  Follows basic commands.  Difficulty with higher-level cognitive tasks. + Cognitive slowing, memory deficits-at baseline  Strength: Antigravity and against resistance in all 4 extremities, 5- out of 5 proximally, 5 out of 5 distally-unchanged 12-6 Mood: Pleasant affect, appropriate mood.  Sensation: Grossly intact.  Assessment/Plan: 1. Functional deficits which require 3+ hours per day of interdisciplinary therapy in a comprehensive inpatient rehab setting. Physiatrist is providing close team supervision and 24  hour management of active medical problems listed below. Physiatrist and rehab team continue to assess barriers to discharge/monitor patient progress toward functional and medical goals  Care Tool:  Bathing    Body parts bathed by patient: Right arm, Left arm, Chest, Abdomen, Front perineal area, Right upper leg, Buttocks, Left upper leg, Right lower leg, Left lower leg, Face   Body parts bathed by helper: Front perineal area, Buttocks, Right upper leg, Left upper leg, Right lower leg, Left lower leg     Bathing assist  Assist Level: Minimal Assistance - Patient > 75%     Upper Body Dressing/Undressing Upper body dressing   What is the patient wearing?: Pull over shirt    Upper body assist Assist Level: Set up assist    Lower Body Dressing/Undressing Lower body dressing      What is the patient wearing?: Pants, Incontinence brief     Lower body assist Assist for lower body dressing: Maximal Assistance - Patient 25 - 49%     Toileting Toileting    Toileting assist Assist for toileting: Maximal Assistance - Patient 25 - 49%     Transfers Chair/bed transfer  Transfers assist  Chair/bed transfer activity did not occur: Safety/medical concerns (Unsafet to get out of bed.)  Chair/bed transfer assist level: Contact Guard/Touching assist     Locomotion Ambulation   Ambulation assist   Ambulation activity did not occur: Safety/medical concerns  Assist level: Contact Guard/Touching assist Assistive device: Walker-rolling Max distance: 45'   Walk 10 feet activity   Assist  Walk 10 feet activity did not occur: Safety/medical concerns  Assist level: Contact Guard/Touching assist Assistive device: Walker-rolling   Walk 50 feet activity   Assist Walk 50 feet with 2 turns activity did not occur: Safety/medical concerns         Walk 150 feet activity   Assist Walk 150 feet activity did not occur: Safety/medical concerns         Walk 10 feet on uneven surface  activity   Assist Walk 10 feet on uneven surfaces activity did not occur: Safety/medical concerns         Wheelchair     Assist Is the patient using a wheelchair?: Yes Type of Wheelchair: Manual    Wheelchair assist level: Moderate Assistance - Patient 50 - 74% Max wheelchair distance: 77'    Wheelchair 50 feet with 2 turns activity    Assist        Assist Level: Moderate Assistance - Patient 50 - 74%   Wheelchair 150 feet activity     Assist      Assist Level: Maximal  Assistance - Patient 25 - 49%   Blood pressure (!) 115/59, pulse 76, temperature 98.2 F (36.8 C), resp. rate 18, height 5\' 11"  (1.803 m), weight 120.2 kg, SpO2 98%.  Medical Problem List and Plan: 1. Functional deficits secondary to acute encephalopathy, hepatic and possible metabolic component             -patient may shower             -ELOS/Goals: 10-14 days SPV, downgrade to CGA PT 12/3 - DC 11/28/23              - Stable to continue CIR  -11/26: Wife not returning calls per SLP. Remains Max A for LBD, min-mod transfers, walking 35 feet with RW Mod A with +2 WC follow. Min A for basic problem solving, Mod A for memory and awareness.   12/3:  Got ahold of wife, she is unable to provide any physical assistance and no one in the home for PSV assist. Needs Stedy for most transfers; CGA to Max A for toiletting depending on session. Min A bathing. PT CGA-Min A transfers with RW, walking up to 45 feet; did a 3 inch step yesterday with Min A. SLP Min A for problem solving, Mod for complex - feel he is close to baseline for cog.   2.  Antithrombotics: -DVT/anticoagulation:  Pharmaceutical: Lovenox             -antiplatelet therapy: N/A 3.Left knee pain: XR ordered, voltaren gel ordered   - 11/21: No complaints today. 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  May benefit from hinged knee brace when weightbearing, or injection if medically stable.  Monitor today for therapy assessments.  - 11/22: With PT, denied pain during session yesterday with ambulation and transfers. No adjustments at this time.   4. Mood/Behavior/Sleep: Provide emotional support             -antipsychotic agents: N/A  11-26: See below, applying overnight pulse ox to screen for OSA -approximately 4 events per hour, lowest desat in the 80s, not diagnostic of OSA.  Will not pursue aggressively water. -11/30 PRN melatonin  -12/1 sleeping better last night, continue current Insomnia  resolved  5. Neuropsych/cognition: This patient is capable of making decisions on his own behalf.  6. Skin/Wound Care: Routine skin checks..Excoriation of buttocks with wound care as directed -Low-air-loss mattress, Mepilex dressing to bilateral heels to prevent breakdown--add Prevalon boot RLE 11/22; never applied, re-ordered 11/27 bilaterally d/t boggy heels  -Goodhart's Butt cream to prevent worsening MASD -Hydrocyn cream to bilateral legs for flaking, dry skin -11-23: Nursing order to increase cream to twice daily, over legs and feet, and to apply Ace wrapping from feet to mid shin to prevent worsening dependent edema. 11-24: Much improved appearance of bilateral lower extremities, continue regimen of wrapping and hydrating cream. 11-26: Lymphedema wraps now on.  Concern regarding ongoing monitoring of right heel DTI with these wraps; prevalon boots as above.  11/27 discussed reason for Prevalon boot use-patient has been compliant  -12/5: Per WOCN assessment, agree R heel DTI will likely evolve into full-thickness ulcer once skin softens. For home management, Apply Xeroform gauze to right heel wound Q day and cover with foam dressing.  Change foam dressing Q 3 days or PRN soiling.  Use Prevalon boot to reduce pressure to heel when in bed.     7. Fluids/Electrolytes/Nutrition: Routine in and outs with follow-up chemistries  -11-21: Sodium 133, repeat in a.m. for trend.  Otherwise labs stable.  11-22: Improved 134 today; next labs on Monday, monitor for cognitive changes  -Sodium up to 136 on 11/25  -12-2: Sodium 139; other labs stable.  P.o. intake adequate.  Monitor.  12-5: Sodium, BUN/creatinine stable.  Albumin increasing.  8.  Colitis versus ileitis-acute appendicitis ruled out.  Follow-up outpatient general surgery  11-24: Remains with frequent loose bowel movements overnight.  Lactulose held/as needed.  Will need to continue targeted bowel movements at least 2 to 3/day, however did  add Imodium for excessive bowel movements.  No current concern for infection, vital stable and no white count.  11-25/26: Unchanged, ongoing.  Questional contribution of cognitive deficits.  Bowel movements do seem to be slowing down.  12-2-3: Once daily liquid to soft bowel movements, remain incontinent - resolved  9.  Hypertension/hypotension.  Blood pressure remains a  bit soft remains on ProAmatine 10 mg 3 times daily as well as low-dose Coreg 12.5 mg twice daily.   - 11/21: Diastolic hypotension as below; resume midodrine 5 mg 2 times daily and wean as tolerated -stable 11-23  11/29 BP  well-controlled, continue current regimen.  Continue midodrine  12/1 BP controlled, continue current regimen  12-2: Has remained normotensive in mid 30s; decrease midodrine to 2.5 mg twice daily, monitor 1 to 2 days before discontinuing  12-3: Has remained normotensive, discontinue midodrine  12/4-5: Stable/normotensive off of midodrine.     11/24/2023    5:00 AM 11/24/2023    3:46 AM 11/23/2023    7:44 PM  Vitals with BMI  Weight 264 lbs 14 oz    BMI 36.96    Systolic  115 129  Diastolic  59 60  Pulse  76 82    10.  Chronic lymphedema.  Follow-up outpatient.  -PT order for lymphedema wrapping and assistance and education, management 11-22 -11-23: Per PT, only 1 PT in the hospital provide lymphedema management, looking into getting them in the room to assist in education this coming week -11/24 nursing providing Ace wrapping to bilateral lower extremities, does appear to be helping. 11/25 reports gradually improving, continue current regimen 11-26: PT Elray Mcgregor applied lymphedema wraps and educated patient on care 11-25; very much appreciate her help in this!  Patient comfortable, continue current management; PT to follow-up for tolerance and rewrap as needed and follow measurements until stable. 12/1 PT reports improving, continue current regimen -lymphedema wraps to be redone 12-2, with ongoing  improvement. 12-3: Per PT, could benefit from toenail cutting for wrapping, unfortunately not available in house.  Discussed with patient outpatient follow-up for toenail management. Will need outpatient referral for lymphedema management; order placed  11.History of alcohol use.  Counseling 12.  Tobacco use.  Counseling 13.  Obesity.  BMI 37.08.  Dietary follow-up 14.  Prediabetes.  Hemoglobin A1c 5.8. d/c ISS and start metformin ER release with dinner, creatinine reviewed and is stable -Blood sugars well-controlled, DC BG checks  11/25 glucose stable on BMP  15.  BPH/bladder incontinence/bowel incontinence.  Flomax 0.4 mg daily. BP reviewed and is stable   -11-23: DC Flomax, continue timed toileting and PVRs  11-24: PVRs remain low, remains continent during the day and incontinent at night.  Continue timed toileting.  11-26: Uncertain what barriers to continence are at this time, patient states that he sleeps through episodes of incontinence although he does feel the urge to go and knows to call the nurse for assistance.  Labs stable as above.  Will apply overnight pulse ox to assess for OSA/disordered sleep.  11-27: OSA evaluation nondiagnostic as above.  Incontinence that seems to be improving as bowel movements slow down.  Continue timed toileting and monitoring.  11/30 Occasional incontinence,  recheck PVR- 193, 220. Will retry flomax  12-2: Ongoing incontinence, PVRs mid 100s.  Continue Flomax  12/3: PVRs stable, DC   12-4: Some improved continence overnight! Ongoing 12/5-6   16. Hyperammonemia.  Hepatic steatosis seen on intake CT.  - 11/21: Seems more encephalopathic today, last ammonia was increased from 40s to 60s, add on to a.m. labs and resume lactulose 7.5 mg twice daily if worsening  11-22: Ammonia within normal limits.  Continues having 4-5 stools per day.  Change lactulose to 10 mg twice daily as needed to target at least 3 stools per day.  Cognition does seem to be  improving.  11-30 continue  current dose lactulose 10g BID PRN  12/4: Some mild cognitive deficits this a.m., which resolved quickly with improved arousal and stimulation.  Repeat labs, ammonia in AM - pending  12-5: Per SLP, cognition improved back to baseline this a.m.   12/6: Labs yesterday significant for ammonia increased from 30s to 55.  T. bili also increased, as expected.  Discussed with patient need for as needed lactulose to reduce ammonia, titrating to 3 bowel movements daily.  Informed nursing as well to administer.  Recheck on a.m. labs Monday.  17. Anemia.  hemoglobin decreased on intake from 12,6 to 10.7.  FOBT, H&H in AM. -11-22: Hemoglobin improved to 11.4, FOBT pending but no obvious blood in stool. - FOBT negative  - Hemoglobin stable 12-2 - recheck 12/5 stable 11.4  LOS: 16 days A FACE TO FACE EVALUATION WAS PERFORMED  Brian [name] 11/24/2023, 9:26 AM

## 2023-11-25 NOTE — Progress Notes (Signed)
PROGRESS NOTE   Subjective/Complaints: No new complaints this morning Slept poorly last night- refused sleeping in bed and stayed in chair Vitals stable except for HTN  ROS: + bowel incontinence, -improved, ongoing  bladder incontinence -mostly resolved Bilateral lower extremity swelling-stable with lymphedema wraps-ongoing  denies fevers, chills, N/V, abdominal pain, constipation, , SOB, cough, chest pain, headache, new weakness or new sensory changes or paraesthesias.    Objective:   No results found. Recent Labs    11/23/23 1228  WBC 4.0  HGB 11.4*  HCT 33.8*  PLT 198     Recent Labs    11/23/23 1228  NA 135  K 4.2  CL 105  CO2 20*  GLUCOSE 126*  BUN 11  CREATININE 0.57*  CALCIUM 9.3      Intake/Output Summary (Last 24 hours) at 11/25/2023 1328 Last data filed at 11/25/2023 0751 Gross per 24 hour  Intake 480 ml  Output 750 ml  Net -270 ml     Pressure Injury 11/08/23 Heel Right Deep Tissue Pressure Injury - Purple or maroon localized area of discolored intact skin or blood-filled blister due to damage of underlying soft tissue from pressure and/or shear. (Active)  11/08/23 1600  Location: Heel  Location Orientation: Right  Staging: Deep Tissue Pressure Injury - Purple or maroon localized area of discolored intact skin or blood-filled blister due to damage of underlying soft tissue from pressure and/or shear.  Wound Description (Comments):   Present on Admission: Yes    Physical Exam: Vital Signs Blood pressure 108/62, pulse 80, temperature 98.9 F (37.2 C), temperature source Oral, resp. rate 18, height 5\' 11"  (1.803 m), weight 120.3 kg, SpO2 96%.  Constitutional: No apparent distress. Appropriate appearance for age.  Morbidly obese.  Sitting at bedside, with SLP. HENT: EOMI. NCAT.  Glasses donned. Cardiovascular: RRR, no murmurs/rub/gallops. 2-3+ bilateral pitting and lymphedema R>L, -wrapped  bilaterally Respiratory: CTAB. No rales, rhonchi, or wheezing.  Abdomen: + bowel sounds hypoactive,  Mildly distended, no  tenderness on palpation. Skin:  + Bilateral chronic lower extremity skin thickening and discolorations-stable + Bilateral buttocks and coccygeal MASD-not examined + Right heel deep tissue injury, unstageable, with underlying bruise-see photos 12-4, stable appearance with skin softening over top  + Left heel bruising seen on photos 12/4; mild, obscured by overlying thickened skin           MSK:     No joint deformity noted.  Full active range of motion all 4 extremities.  Neurologic exam:  Cognition: Alert and awake.  Oriented x 3..  Follows basic commands.  Difficulty with higher-level cognitive tasks. + Cognitive slowing, memory deficits-at baseline  Strength: Antigravity and against resistance in all 4 extremities, 5- out of 5 proximally, 5 out of 5 distally-stable 12/7 Mood: Pleasant affect, appropriate mood.  Sensation: Grossly intact.  Assessment/Plan: 1. Functional deficits which require 3+ hours per day of interdisciplinary therapy in a comprehensive inpatient rehab setting. Physiatrist is providing close team supervision and 24 hour management of active medical problems listed below. Physiatrist and rehab team continue to assess barriers to discharge/monitor patient progress toward functional and medical goals  Care Tool:  Bathing    Body  parts bathed by patient: Right arm, Left arm, Chest, Abdomen, Front perineal area, Right upper leg, Buttocks, Left upper leg, Right lower leg, Left lower leg, Face   Body parts bathed by helper: Front perineal area, Buttocks, Right upper leg, Left upper leg, Right lower leg, Left lower leg     Bathing assist Assist Level: Minimal Assistance - Patient > 75%     Upper Body Dressing/Undressing Upper body dressing   What is the patient wearing?: Pull over shirt    Upper body assist Assist Level: Set up  assist    Lower Body Dressing/Undressing Lower body dressing      What is the patient wearing?: Pants, Incontinence brief     Lower body assist Assist for lower body dressing: Maximal Assistance - Patient 25 - 49%     Toileting Toileting    Toileting assist Assist for toileting: Maximal Assistance - Patient 25 - 49%     Transfers Chair/bed transfer  Transfers assist  Chair/bed transfer activity did not occur: Safety/medical concerns (Unsafet to get out of bed.)  Chair/bed transfer assist level: Contact Guard/Touching assist     Locomotion Ambulation   Ambulation assist   Ambulation activity did not occur: Safety/medical concerns  Assist level: Contact Guard/Touching assist Assistive device: Walker-rolling Max distance: 175'   Walk 10 feet activity   Assist  Walk 10 feet activity did not occur: Safety/medical concerns  Assist level: Contact Guard/Touching assist Assistive device: Walker-rolling   Walk 50 feet activity   Assist Walk 50 feet with 2 turns activity did not occur: Safety/medical concerns  Assist level: Contact Guard/Touching assist Assistive device: Walker-rolling    Walk 150 feet activity   Assist Walk 150 feet activity did not occur: Safety/medical concerns  Assist level: Contact Guard/Touching assist Assistive device: Walker-rolling    Walk 10 feet on uneven surface  activity   Assist Walk 10 feet on uneven surfaces activity did not occur: Safety/medical concerns         Wheelchair     Assist Is the patient using a wheelchair?: Yes Type of Wheelchair: Manual    Wheelchair assist level: Moderate Assistance - Patient 50 - 74% Max wheelchair distance: 7'    Wheelchair 50 feet with 2 turns activity    Assist        Assist Level: Moderate Assistance - Patient 50 - 74%   Wheelchair 150 feet activity     Assist      Assist Level: Maximal Assistance - Patient 25 - 49%   Blood pressure 108/62, pulse  80, temperature 98.9 F (37.2 C), temperature source Oral, resp. rate 18, height 5\' 11"  (1.803 m), weight 120.3 kg, SpO2 96%.  Medical Problem List and Plan: 1. Functional deficits secondary to acute encephalopathy, hepatic and possible metabolic component             -patient may shower             -ELOS/Goals: 10-14 days SPV, downgrade to CGA PT 12/3 - DC 11/28/23              - Continue CIR  -11/26: Wife not returning calls per SLP. Remains Max A for LBD, min-mod transfers, walking 35 feet with RW Mod A with +2 WC follow. Min A for basic problem solving, Mod A for memory and awareness.   12/3: Got ahold of wife, she is unable to provide any physical assistance and no one in the home for PSV assist. Needs Stedy for most transfers;  CGA to Max A for toiletting depending on session. Min A bathing. PT CGA-Min A transfers with RW, walking up to 45 feet; did a 3 inch step yesterday with Min A. SLP Min A for problem solving, Mod for complex - feel he is close to baseline for cog.   2.  Antithrombotics: -DVT/anticoagulation:  Pharmaceutical: Lovenox             -antiplatelet therapy: N/A 3.Left knee pain: XR ordered, continue voltaren gel    - 11/21: No complaints today. 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  May benefit from hinged knee brace when weightbearing, or injection if medically stable.  Monitor today for therapy assessments.  - 11/22: With PT, denied pain during session yesterday with ambulation and transfers. No adjustments at this time.   4. Mood/Behavior/Sleep: Provide emotional support             -antipsychotic agents: N/A  11-26: See below, applying overnight pulse ox to screen for OSA -approximately 4 events per hour, lowest desat in the 80s, not diagnostic of OSA.  Will not pursue aggressively water. -11/30 PRN melatonin  -12/1 sleeping better last night, continue current Insomnia resolved  5. Neuropsych/cognition: This patient is  capable of making decisions on his own behalf.  6. Skin/Wound Care: Routine skin checks..Excoriation of buttocks with wound care as directed -Low-air-loss mattress, Mepilex dressing to bilateral heels to prevent breakdown--add Prevalon boot RLE 11/22; never applied, re-ordered 11/27 bilaterally d/t boggy heels  -Goodhart's Butt cream to prevent worsening MASD -Hydrocyn cream to bilateral legs for flaking, dry skin -11-23: Nursing order to increase cream to twice daily, over legs and feet, and to apply Ace wrapping from feet to mid shin to prevent worsening dependent edema. 11-24: Much improved appearance of bilateral lower extremities, continue regimen of wrapping and hydrating cream. 11-26: Lymphedema wraps now on.  Concern regarding ongoing monitoring of right heel DTI with these wraps; prevalon boots as above.  11/27 discussed reason for Prevalon boot use-patient has been compliant  -12/5: Per WOCN assessment, agree R heel DTI will likely evolve into full-thickness ulcer once skin softens. For home management, Apply Xeroform gauze to right heel wound Q day and cover with foam dressing.  Change foam dressing Q 3 days or PRN soiling.  Use Prevalon boot to reduce pressure to heel when in bed.     7. Fluids/Electrolytes/Nutrition: Routine in and outs with follow-up chemistries  -11-21: Sodium 133, repeat in a.m. for trend.  Otherwise labs stable.  11-22: Improved 134 today; next labs on Monday, monitor for cognitive changes  -Sodium up to 136 on 11/25  -12-2: Sodium 139; other labs stable.  P.o. intake adequate.  Monitor.  12-5: Sodium, BUN/creatinine stable.  Albumin increasing.  8.  Colitis versus ileitis-acute appendicitis ruled out.  Follow-up outpatient general surgery  11-24: Remains with frequent loose bowel movements overnight.  Lactulose held/as needed.  Will need to continue targeted bowel movements at least 2 to 3/day, however did add Imodium for excessive bowel movements.  No current  concern for infection, vital stable and no white count.  11-25/26: Unchanged, ongoing.  Questional contribution of cognitive deficits.  Bowel movements do seem to be slowing down.  12-2-3: Once daily liquid to soft bowel movements, remain incontinent - resolved  9.  Hypertension/hypotension.  Blood pressure remains a bit soft remains on ProAmatine 10 mg 3 times daily as well as low-dose Coreg 12.5 mg twice daily.   - 11/21: Diastolic hypotension  as below; resume midodrine 5 mg 2 times daily and wean as tolerated -stable 11-23  11/29 BP  well-controlled, continue current regimen.  Continue midodrine  12/1 BP controlled, continue current regimen  12-2: Has remained normotensive in mid 30s; decrease midodrine to 2.5 mg twice daily, monitor 1 to 2 days before discontinuing  12-3: Has remained normotensive, discontinue midodrine  12/4-5: Stable/normotensive off of midodrine.  12/7: continue coreg     11/25/2023    5:20 AM 11/25/2023    4:55 AM 11/24/2023    7:31 PM  Vitals with BMI  Weight 265 lbs 3 oz    BMI 37.01    Systolic  108 143  Diastolic  62 67  Pulse  80 81    10.  Chronic lymphedema.  Follow-up outpatient.  -PT order for lymphedema wrapping and assistance and education, management 11-22 -11-23: Per PT, only 1 PT in the hospital provide lymphedema management, looking into getting them in the room to assist in education this coming week -11/24 nursing providing Ace wrapping to bilateral lower extremities, does appear to be helping. 11/25 reports gradually improving, continue current regimen 11-26: PT Elray Mcgregor applied lymphedema wraps and educated patient on care 11-25; very much appreciate her help in this!  Patient comfortable, continue current management; PT to follow-up for tolerance and rewrap as needed and follow measurements until stable. 12/1 PT reports improving, continue current regimen -lymphedema wraps to be redone 12-2, with ongoing improvement. 12-3: Per PT, could  benefit from toenail cutting for wrapping, unfortunately not available in house.  Discussed with patient outpatient follow-up for toenail management. Will need outpatient referral for lymphedema management; order placed  11.History of alcohol use.  Counseling  12.  Tobacco use.  Counseling  13.  Obesity.  BMI 37.08.  Dietary follow-up  14.  Prediabetes.  Hemoglobin A1c 5.8. d/c ISS and start metformin ER release with dinner, creatinine reviewed and is stable -Blood sugars well-controlled, DC BG checks  11/25 glucose stable on BMP  15.  BPH/bladder incontinence/bowel incontinence.  Flomax 0.4 mg daily. BP reviewed and is stable   -11-23: DC Flomax, continue timed toileting and PVRs  11-24: PVRs remain low, remains continent during the day and incontinent at night.  Continue timed toileting.  11-26: Uncertain what barriers to continence are at this time, patient states that he sleeps through episodes of incontinence although he does feel the urge to go and knows to call the nurse for assistance.  Labs stable as above.  Will apply overnight pulse ox to assess for OSA/disordered sleep.  11-27: OSA evaluation nondiagnostic as above.  Incontinence that seems to be improving as bowel movements slow down.  Continue timed toileting and monitoring.  11/30 Occasional incontinence,  recheck PVR- 193, 220. Will retry flomax  12-2: Ongoing incontinence, PVRs mid 100s.  Continue Flomax  12/3: PVRs stable, DC   12-4: Some improved continence overnight! Ongoing 12/5-6   16. Hyperammonemia.  Hepatic steatosis seen on intake CT.  - 11/21: Seems more encephalopathic today, last ammonia was increased from 40s to 60s, add on to a.m. labs and resume lactulose 7.5 mg twice daily if worsening  11-22: Ammonia within normal limits.  Continues having 4-5 stools per day.  Change lactulose to 10 mg twice daily as needed to target at least 3 stools per day.  Cognition does seem to be improving.  11-30 continue current  dose lactulose 10g BID PRN  12/4: Some mild cognitive deficits this a.m., which resolved quickly with  improved arousal and stimulation.  Repeat labs, ammonia in AM - pending  12-5: Per SLP, cognition improved back to baseline this a.m.   12/6: Labs yesterday significant for ammonia increased from 30s to 55.  T. bili also increased, as expected.  Discussed with patient need for as needed lactulose to reduce ammonia, titrating to 3 bowel movements daily.  Informed nursing as well to administer.  Recheck on a.m. labs Monday.  17. Anemia.  hemoglobin decreased on intake from 12,6 to 10.7.  FOBT, H&H in AM. -11-22: Hemoglobin improved to 11.4, FOBT pending but no obvious blood in stool. - FOBT negative  - Hemoglobin stable 12-2 - recheck 12/5 stable 11.4  18. Dry skin: continue Eucerin  LOS: 17 days A FACE TO FACE EVALUATION WAS PERFORMED  Genna Casimir P Justyne Roell 11/25/2023, 1:28 PM

## 2023-11-25 NOTE — Progress Notes (Signed)
Patient declined sleeping in the bed. Stayed in chair all night. Did not sleep either. Declined toileting. Remains. Alert and oriented. Breakfast tray served. No new changes to report. Safety ensured.

## 2023-11-25 NOTE — Progress Notes (Signed)
Speech Language Pathology Daily Session Note  Patient Details  Name: Brian [name] MRN: [id number] Date of Birth: 1961-10-03  Today's Date: 11/25/2023 SLP Individual Time: 1445-1530 SLP Individual Time Calculation (min): 45 min  Short Term Goals: Week 3: SLP Short Term Goal 1 (Week 3): STGs=LTGs d/t ELOS  Skilled Therapeutic Interventions: SLP conducted skilled therapy session targeting cognitive retraining goals. Throughout session, patient initiated conversations with therapist indicating clear memory of information about therapist learned in previous session with modified independence. During scanning/functional executive function task, patient completed task with modified independence with 100% accuracy. During memory task where asked to recreate a pattern from memory, patient benefited from reviewing visual aid x4 when stuck for accuracy. With review of this visual aid as an option, patient completed with supervision. Patient was left in chair with call bell in reach and chair alarm set. SLP will continue to target goals per plan of care.       Pain Pain Assessment Pain Scale: 0-10 Pain Score: 0-No pain  Therapy/Group: Individual Therapy  Jeannie Done, M.A., CCC-SLP  [name] 11/25/2023, 3:51 PM

## 2023-11-26 NOTE — Progress Notes (Signed)
Occupational Therapy Session Note  Patient Details  Name: Brian [name] MRN: [id number] Date of Birth: [birth year]  Today's Date: 11/26/2023 OT Individual Time: 0800-0900 OT Individual Time Calculation (min): 60 min    Short Term Goals: Week 1:  OT Short Term Goal 1 (Week 1): Patient will complete toilet transfer with max A of 1 OT Short Term Goal 1 - Progress (Week 1): Met OT Short Term Goal 2 (Week 1): Pt will complete 1 step of toileting OT Short Term Goal 2 - Progress (Week 1): Met OT Short Term Goal 3 (Week 1): Patient will stand with max A of 1 OT Short Term Goal 3 - Progress (Week 1): Met  Skilled Therapeutic Interventions/Progress Updates:     Patient sitting in w/c at the time of arrival. Patient indicated that he rested well and he had no report of pain at the time of treatment. Patient in agreement with completing BADL related task in bathing at sink LOF. Nursing came in during the session to provide morning meds. The pt went on to indicate that he needed to go to the restroom and was able to come from sit to stand using the RW for additional balance for ambulating to the restroom with CGA. The pt was able to go from sit to stand to doff his LB garments, a brief and pants with CGA using the grab bar.  The pt was close S for lowering himself to the commode using the grab bar. The pt was able to complete toileting hygiene in stand incorporating the grab bar for additional balance.  The pt was able to donn his LB garments for transporting to the sink area with CGA.  The pt was able to doff his over head shirt with with SBA, he was able to complete  sit to stand using the sink for additional balance with CGA for doffing his brief and pants. The pt was able to wash UB/LB inclusive of bilateral ULE with s/uA.  He was able to wash his perineal and bottom in standing with SBA.  The pt was able to apply deodorant and lotion with s/u A.  The pt was able to donn his over head shirt with s/uA and  he was MaxA for donning his brief and CGA for donning his pants. The pt was able to complete a grooming task of combing his hair and brushing his teeth with s/uA.     The pt remained at w/c LOF and completed UB exercise using theraband for shld flexion and horizontal abduction 2 set of 10 with rest breaks as needed. The pt required 3 rest breaks. The pt remained at w/c LOF with BLE supported on the footrest and his call light and alarm activated. All additional needs were addressed prior to exiting the room.  Therapy Documentation Precautions:  Precautions Precautions: Fall Precaution Comments: incontinence Restrictions Weight Bearing Restrictions: No   Therapy/Group: Individual Therapy  Lavona Mound 11/26/2023, 4:12 PM

## 2023-11-26 NOTE — Plan of Care (Signed)
  Problem: Consults Goal: RH GENERAL PATIENT EDUCATION Description: See Patient Education module for education specifics. Outcome: Progressing Goal: Skin Care Protocol Initiated - if Braden Score 18 or less Description: If consults are not indicated, leave blank or document N/A Outcome: Progressing   Problem: RH BOWEL ELIMINATION Goal: RH STG MANAGE BOWEL WITH ASSISTANCE Description: STG Manage Bowel with min Assistance. Outcome: Progressing Goal: RH STG MANAGE BOWEL W/MEDICATION W/ASSISTANCE Description: STG Manage Bowel with Medication with min Assistance. Outcome: Progressing   Problem: RH BLADDER ELIMINATION Goal: RH STG MANAGE BLADDER WITH ASSISTANCE Description: STG Manage Bladder With min Assistance Outcome: Progressing   Problem: RH SKIN INTEGRITY Goal: RH STG SKIN FREE OF INFECTION/BREAKDOWN Description: MASD will improve and skin will be free of infection without additional breakdown with min assist  Outcome: Progressing Goal: RH STG MAINTAIN SKIN INTEGRITY WITH ASSISTANCE Description: STG Maintain Skin Integrity With min Assistance. Outcome: Progressing Goal: RH STG ABLE TO PERFORM INCISION/WOUND CARE W/ASSISTANCE Description: STG Able To Perform Incision/Wound Care With min Assistance. Outcome: Progressing   Problem: RH SAFETY Goal: RH STG ADHERE TO SAFETY PRECAUTIONS W/ASSISTANCE/DEVICE Description: STG Adhere to Safety Precautions With min Assistance/Device. Outcome: Progressing Goal: RH STG DECREASED RISK OF FALL WITH ASSISTANCE Description: STG Decreased Risk of Fall With cueing Assistance. Outcome: Progressing   Problem: RH PAIN MANAGEMENT Goal: RH STG PAIN MANAGED AT OR BELOW PT'S PAIN GOAL Description: Less than 2 with PRN medications min assist  Outcome: Progressing   Problem: RH KNOWLEDGE DEFICIT GENERAL Goal: RH STG INCREASE KNOWLEDGE OF SELF CARE AFTER HOSPITALIZATION Description: Patient/caregiver will be able to manage medications, self care,  diet/lifestyle modifications to improve overall health from nursing education, nursing handouts and other resources independently  Outcome: Progressing   Problem: Education: Goal: Ability to describe self-care measures that may prevent or decrease complications (Diabetes Survival Skills Education) will improve Outcome: Progressing Goal: Individualized Educational Video(s) Outcome: Progressing   Problem: Coping: Goal: Ability to adjust to condition or change in health will improve Outcome: Progressing   Problem: Fluid Volume: Goal: Ability to maintain a balanced intake and output will improve Outcome: Progressing   Problem: Health Behavior/Discharge Planning: Goal: Ability to identify and utilize available resources and services will improve Outcome: Progressing Goal: Ability to manage health-related needs will improve Outcome: Progressing   Problem: Metabolic: Goal: Ability to maintain appropriate glucose levels will improve Outcome: Progressing   Problem: Nutritional: Goal: Maintenance of adequate nutrition will improve Outcome: Progressing Goal: Progress toward achieving an optimal weight will improve Outcome: Progressing   Problem: Skin Integrity: Goal: Risk for impaired skin integrity will decrease Outcome: Progressing   Problem: Tissue Perfusion: Goal: Adequacy of tissue perfusion will improve Outcome: Progressing   Problem: Consults Goal: RH GENERAL PATIENT EDUCATION Description: See Patient Education module for education specifics. Outcome: Progressing

## 2023-11-26 NOTE — Progress Notes (Signed)
PROGRESS NOTE   Subjective/Complaints: No new complaints this morning Patient's chart reviewed- No issues reported overnight Vitals signs stable   ROS: + bowel incontinence, -improved, ongoing  bladder incontinence -mostly resolved Bilateral lower extremity swelling-stable with lymphedema wraps-ongoing  denies fevers, chills, N/V, abdominal pain, constipation, , SOB, cough, chest pain, headache, new weakness or new sensory changes or paraesthesias.    Objective:   No results found. No results for input(s): "WBC", "HGB", "HCT", "PLT" in the last 72 hours.    No results for input(s): "NA", "K", "CL", "CO2", "GLUCOSE", "BUN", "CREATININE", "CALCIUM" in the last 72 hours.     Intake/Output Summary (Last 24 hours) at 11/26/2023 1314 Last data filed at 11/26/2023 0800 Gross per 24 hour  Intake 477 ml  Output 1550 ml  Net -1073 ml     Pressure Injury 11/08/23 Heel Right Deep Tissue Pressure Injury - Purple or maroon localized area of discolored intact skin or blood-filled blister due to damage of underlying soft tissue from pressure and/or shear. (Active)  11/08/23 1600  Location: Heel  Location Orientation: Right  Staging: Deep Tissue Pressure Injury - Purple or maroon localized area of discolored intact skin or blood-filled blister due to damage of underlying soft tissue from pressure and/or shear.  Wound Description (Comments):   Present on Admission: Yes    Physical Exam: Vital Signs Blood pressure 124/76, pulse 79, temperature 99 F (37.2 C), temperature source Oral, resp. rate 18, height 5\' 11"  (1.803 m), weight 120.2 kg, SpO2 95%.  Constitutional: No apparent distress. Appropriate appearance for age.  Morbidly obese.  Sitting at bedside, with SLP. HENT: EOMI. NCAT.  Glasses donned. Cardiovascular: RRR, no murmurs/rub/gallops. 2-3+ bilateral pitting and lymphedema R>L, -wrapped bilaterally Respiratory: CTAB. No  rales, rhonchi, or wheezing.  Abdomen: + bowel sounds hypoactive,  Mildly distended, no  tenderness on palpation. Skin:  + Bilateral chronic lower extremity skin thickening and discolorations-stable + Bilateral buttocks and coccygeal MASD-not examined + Right heel deep tissue injury, unstageable, with underlying bruise-see photos 12-4, stable appearance with skin softening over top  + Left heel bruising seen on photos 12/4; mild, obscured by overlying thickened skin           MSK:     No joint deformity noted.  Full active range of motion all 4 extremities.  Neurologic exam:  Cognition: Alert and awake.  Oriented x 3..  Follows basic commands.  Difficulty with higher-level cognitive tasks. + Cognitive slowing, memory deficits-at baseline  Strength: Antigravity and against resistance in all 4 extremities, 5- out of 5 proximally, 5 out of 5 distally-stable 12/8 Mood: Pleasant affect, appropriate mood.  Sensation: Grossly intact.  Assessment/Plan: 1. Functional deficits which require 3+ hours per day of interdisciplinary therapy in a comprehensive inpatient rehab setting. Physiatrist is providing close team supervision and 24 hour management of active medical problems listed below. Physiatrist and rehab team continue to assess barriers to discharge/monitor patient progress toward functional and medical goals  Care Tool:  Bathing    Body parts bathed by patient: Right arm, Left arm, Chest, Abdomen, Front perineal area, Right upper leg, Buttocks, Left upper leg, Right lower leg, Left lower leg, Face  Body parts bathed by helper: Front perineal area, Buttocks, Right upper leg, Left upper leg, Right lower leg, Left lower leg     Bathing assist Assist Level: Minimal Assistance - Patient > 75%     Upper Body Dressing/Undressing Upper body dressing   What is the patient wearing?: Pull over shirt    Upper body assist Assist Level: Set up assist    Lower Body  Dressing/Undressing Lower body dressing      What is the patient wearing?: Pants, Incontinence brief     Lower body assist Assist for lower body dressing: Maximal Assistance - Patient 25 - 49%     Toileting Toileting    Toileting assist Assist for toileting: Maximal Assistance - Patient 25 - 49%     Transfers Chair/bed transfer  Transfers assist  Chair/bed transfer activity did not occur: Safety/medical concerns (Unsafet to get out of bed.)  Chair/bed transfer assist level: Contact Guard/Touching assist     Locomotion Ambulation   Ambulation assist   Ambulation activity did not occur: Safety/medical concerns  Assist level: Contact Guard/Touching assist Assistive device: Walker-rolling Max distance: 175'   Walk 10 feet activity   Assist  Walk 10 feet activity did not occur: Safety/medical concerns  Assist level: Contact Guard/Touching assist Assistive device: Walker-rolling   Walk 50 feet activity   Assist Walk 50 feet with 2 turns activity did not occur: Safety/medical concerns  Assist level: Contact Guard/Touching assist Assistive device: Walker-rolling    Walk 150 feet activity   Assist Walk 150 feet activity did not occur: Safety/medical concerns  Assist level: Contact Guard/Touching assist Assistive device: Walker-rolling    Walk 10 feet on uneven surface  activity   Assist Walk 10 feet on uneven surfaces activity did not occur: Safety/medical concerns         Wheelchair     Assist Is the patient using a wheelchair?: Yes Type of Wheelchair: Manual    Wheelchair assist level: Moderate Assistance - Patient 50 - 74% Max wheelchair distance: 61'    Wheelchair 50 feet with 2 turns activity    Assist        Assist Level: Moderate Assistance - Patient 50 - 74%   Wheelchair 150 feet activity     Assist      Assist Level: Maximal Assistance - Patient 25 - 49%   Blood pressure 124/76, pulse 79, temperature 99 F  (37.2 C), temperature source Oral, resp. rate 18, height 5\' 11"  (1.803 m), weight 120.2 kg, SpO2 95%.  Medical Problem List and Plan: 1. Functional deficits secondary to acute encephalopathy, hepatic and possible metabolic component             -patient may shower             -ELOS/Goals: 10-14 days SPV, downgrade to CGA PT 12/3 - DC 11/28/23              - Continue CIR  -11/26: Wife not returning calls per SLP. Remains Max A for LBD, min-mod transfers, walking 35 feet with RW Mod A with +2 WC follow. Min A for basic problem solving, Mod A for memory and awareness.   12/3: Got ahold of wife, she is unable to provide any physical assistance and no one in the home for PSV assist. Needs Stedy for most transfers; CGA to Max A for toiletting depending on session. Min A bathing. PT CGA-Min A transfers with RW, walking up to 45 feet; did a 3 inch step yesterday  with Min A. SLP Min A for problem solving, Mod for complex - feel he is close to baseline for cog.   2.  Antithrombotics: -DVT/anticoagulation:  Pharmaceutical: Lovenox             -antiplatelet therapy: N/A 3.Left knee pain: XR ordered, continue voltaren gel    - 11/21: No complaints today. 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  May benefit from hinged knee brace when weightbearing, or injection if medically stable.  Monitor today for therapy assessments.  - 11/22: With PT, denied pain during session yesterday with ambulation and transfers. No adjustments at this time.   4. Mood/Behavior/Sleep: Provide emotional support             -antipsychotic agents: N/A  11-26: See below, applying overnight pulse ox to screen for OSA -approximately 4 events per hour, lowest desat in the 80s, not diagnostic of OSA.  Will not pursue aggressively water. -11/30 PRN melatonin  -12/1 sleeping better last night, continue current Insomnia resolved  5. Neuropsych/cognition: This patient is capable of making decisions  on his own behalf.  6. Skin/Wound Care: Routine skin checks..Excoriation of buttocks with wound care as directed -Low-air-loss mattress, Mepilex dressing to bilateral heels to prevent breakdown--add Prevalon boot RLE 11/22; never applied, re-ordered 11/27 bilaterally d/t boggy heels  -Goodhart's Butt cream to prevent worsening MASD -Hydrocyn cream to bilateral legs for flaking, dry skin -11-23: Nursing order to increase cream to twice daily, over legs and feet, and to apply Ace wrapping from feet to mid shin to prevent worsening dependent edema. 11-24: Much improved appearance of bilateral lower extremities, continue regimen of wrapping and hydrating cream. 11-26: Lymphedema wraps now on.  Concern regarding ongoing monitoring of right heel DTI with these wraps; prevalon boots as above.  11/27 discussed reason for Prevalon boot use-patient has been compliant  -12/5: Per WOCN assessment, agree R heel DTI will likely evolve into full-thickness ulcer once skin softens. For home management, Apply Xeroform gauze to right heel wound Q day and cover with foam dressing.  Change foam dressing Q 3 days or PRN soiling.  Use Prevalon boot to reduce pressure to heel when in bed.     7. Fluids/Electrolytes/Nutrition: Routine in and outs with follow-up chemistries  -11-21: Sodium 133, repeat in a.m. for trend.  Otherwise labs stable.  11-22: Improved 134 today; next labs on Monday, monitor for cognitive changes  -Sodium up to 136 on 11/25  -12-2: Sodium 139; other labs stable.  P.o. intake adequate.  Monitor.  12-5: Sodium, BUN/creatinine stable.  Albumin increasing.  8.  Colitis versus ileitis-acute appendicitis ruled out.  Follow-up outpatient general surgery  11-24: Remains with frequent loose bowel movements overnight.  Lactulose held/as needed.  Will need to continue targeted bowel movements at least 2 to 3/day, however did add Imodium for excessive bowel movements.  No current concern for infection,  vital stable and no white count.  11-25/26: Unchanged, ongoing.  Questional contribution of cognitive deficits.  Bowel movements do seem to be slowing down.  12-2-3: Once daily liquid to soft bowel movements, remain incontinent - resolved  9.  Hypertension/hypotension.  Blood pressure remains a bit soft remains on ProAmatine 10 mg 3 times daily as well as low-dose Coreg 12.5 mg twice daily.   - 11/21: Diastolic hypotension as below; resume midodrine 5 mg 2 times daily and wean as tolerated -stable 11-23  11/29 BP  well-controlled, continue current regimen.  Continue midodrine  12/1 BP  controlled, continue current regimen  12-2: Has remained normotensive in mid 30s; decrease midodrine to 2.5 mg twice daily, monitor 1 to 2 days before discontinuing  12-3: Has remained normotensive, discontinue midodrine  12/4-5: Stable/normotensive off of midodrine.  Continue coreg     11/26/2023    6:17 AM 11/26/2023    5:05 AM 11/25/2023    7:33 PM  Vitals with BMI  Weight 265 lbs    BMI 36.98    Systolic  124 115  Diastolic  76 57  Pulse  79 79    10.  Chronic lymphedema.  Follow-up outpatient.  -PT order for lymphedema wrapping and assistance and education, management 11-22 -11-23: Per PT, only 1 PT in the hospital provide lymphedema management, looking into getting them in the room to assist in education this coming week -11/24 nursing providing Ace wrapping to bilateral lower extremities, does appear to be helping. 11/25 reports gradually improving, continue current regimen 11-26: PT Elray Mcgregor applied lymphedema wraps and educated patient on care 11-25; very much appreciate her help in this!  Patient comfortable, continue current management; PT to follow-up for tolerance and rewrap as needed and follow measurements until stable. 12/1 PT reports improving, continue current regimen -lymphedema wraps to be redone 12-2, with ongoing improvement. 12-3: Per PT, could benefit from toenail cutting for  wrapping, unfortunately not available in house.  Discussed with patient outpatient follow-up for toenail management. Will need outpatient referral for lymphedema management; order placed  11.History of alcohol use.  Counseling  12.  Tobacco use.  Counseling  13.  Obesity.  BMI 37.08.  Dietary follow-up  14.  Prediabetes.  Hemoglobin A1c 5.8. d/c ISS and start metformin ER release with dinner, creatinine reviewed and is stable -Blood sugars well-controlled, DC BG checks  11/25 glucose stable on BMP  15.  BPH/bladder incontinence/bowel incontinence.  Flomax 0.4 mg daily. BP reviewed and is stable   -11-23: DC Flomax, continue timed toileting and PVRs  11-24: PVRs remain low, remains continent during the day and incontinent at night.  Continue timed toileting.  11-26: Uncertain what barriers to continence are at this time, patient states that he sleeps through episodes of incontinence although he does feel the urge to go and knows to call the nurse for assistance.  Labs stable as above.  Will apply overnight pulse ox to assess for OSA/disordered sleep.  11-27: OSA evaluation nondiagnostic as above.  Incontinence that seems to be improving as bowel movements slow down.  Continue timed toileting and monitoring.  11/30 Occasional incontinence,  recheck PVR- 193, 220. Will retry flomax  12-2: Ongoing incontinence, PVRs mid 100s.  continue Flomax  12/3: PVRs stable, DC   12-4: Some improved continence overnight! Ongoing 12/5-6   16. Hyperammonemia.  Hepatic steatosis seen on intake CT.  - 11/21: Seems more encephalopathic today, last ammonia was increased from 40s to 60s, add on to a.m. labs and resume lactulose 7.5 mg twice daily if worsening  11-22: Ammonia within normal limits.  Continues having 4-5 stools per day.  Change lactulose to 10 mg twice daily as needed to target at least 3 stools per day.  Cognition does seem to be improving.  11-30 continue current dose lactulose 10g BID PRN  12/4:  Some mild cognitive deficits this a.m., which resolved quickly with improved arousal and stimulation.  Repeat labs, ammonia in AM - pending  12-5: Per SLP, cognition improved back to baseline this a.m.   12/6: Labs yesterday significant for ammonia increased  from 30s to 55.  T. bili also increased, as expected.  Discussed with patient need for as needed lactulose to reduce ammonia, titrating to 3 bowel movements daily.  Informed nursing as well to administer.  Recheck on a.m. labs Monday.  17. Anemia.  hemoglobin decreased on intake from 12,6 to 10.7.  FOBT, H&H in AM. -11-22: Hemoglobin improved to 11.4, FOBT pending but no obvious blood in stool. - FOBT negative  - Hemoglobin stable 12-2 - recheck 12/5 stable 11.4  18. Dry skin: continue Eucerin  LOS: 18 days A FACE TO FACE EVALUATION WAS PERFORMED  Madhavi Hamblen P Gabriella Guile 11/26/2023, 1:14 PM

## 2023-11-27 ENCOUNTER — Other Ambulatory Visit (HOSPITAL_COMMUNITY): Payer: Self-pay

## 2023-11-27 LAB — CBC WITH DIFFERENTIAL/PLATELET
Abs Immature Granulocytes: 0.01 10*3/uL (ref 0.00–0.07)
Basophils Absolute: 0 10*3/uL (ref 0.0–0.1)
Basophils Relative: 1 %
Eosinophils Absolute: 0.2 10*3/uL (ref 0.0–0.5)
Eosinophils Relative: 5 %
HCT: 30.7 % — ABNORMAL LOW (ref 39.0–52.0)
Hemoglobin: 10.5 g/dL — ABNORMAL LOW (ref 13.0–17.0)
Immature Granulocytes: 0 %
Lymphocytes Relative: 20 %
Lymphs Abs: 0.8 10*3/uL (ref 0.7–4.0)
MCH: 34.2 pg — ABNORMAL HIGH (ref 26.0–34.0)
MCHC: 34.2 g/dL (ref 30.0–36.0)
MCV: 100 fL (ref 80.0–100.0)
Monocytes Absolute: 0.6 10*3/uL (ref 0.1–1.0)
Monocytes Relative: 14 %
Neutro Abs: 2.5 10*3/uL (ref 1.7–7.7)
Neutrophils Relative %: 60 %
Platelets: 170 10*3/uL (ref 150–400)
RBC: 3.07 MIL/uL — ABNORMAL LOW (ref 4.22–5.81)
RDW: 14.5 % (ref 11.5–15.5)
WBC: 4.2 10*3/uL (ref 4.0–10.5)
nRBC: 0 % (ref 0.0–0.2)

## 2023-11-27 LAB — COMPREHENSIVE METABOLIC PANEL
ALT: 23 U/L (ref 0–44)
AST: 37 U/L (ref 15–41)
Albumin: 2.5 g/dL — ABNORMAL LOW (ref 3.5–5.0)
Alkaline Phosphatase: 115 U/L (ref 38–126)
Anion gap: 8 (ref 5–15)
BUN: 11 mg/dL (ref 6–20)
CO2: 21 mmol/L — ABNORMAL LOW (ref 22–32)
Calcium: 8.7 mg/dL — ABNORMAL LOW (ref 8.9–10.3)
Chloride: 104 mmol/L (ref 98–111)
Creatinine, Ser: 0.53 mg/dL — ABNORMAL LOW (ref 0.61–1.24)
GFR, Estimated: >60 mL/min (ref >60)
Glucose, Bld: 133 mg/dL — ABNORMAL HIGH (ref 70–99)
Potassium: 3.6 mmol/L (ref 3.5–5.1)
Sodium: 133 mmol/L — ABNORMAL LOW (ref 135–145)
Total Bilirubin: 3 mg/dL — ABNORMAL HIGH (ref <1.2)
Total Protein: 7.5 g/dL (ref 6.5–8.1)

## 2023-11-27 LAB — AMMONIA: Ammonia: 79 umol/L — ABNORMAL HIGH (ref 9–35)

## 2023-11-27 MED ORDER — LACTULOSE 10 GM/15ML PO SOLN
10.0000 g | Freq: Two times a day (BID) | ORAL | 0 refills | Status: AC | PRN
  Filled 2023-11-27: qty 237, 8d supply, fill #0

## 2023-11-27 MED ORDER — DICLOFENAC SODIUM 1 % EX GEL
4.0000 g | Freq: Four times a day (QID) | CUTANEOUS | 0 refills | Status: AC
  Filled 2023-11-27: qty 50, 30d supply, fill #0

## 2023-11-27 MED ORDER — CARVEDILOL 12.5 MG PO TABS
12.5000 mg | ORAL_TABLET | Freq: Two times a day (BID) | ORAL | 0 refills | Status: AC
  Filled 2023-11-27: qty 60, 30d supply, fill #0

## 2023-11-27 MED ORDER — FOLIC ACID 1 MG PO TABS
1.0000 mg | ORAL_TABLET | Freq: Every day | ORAL | 0 refills | Status: AC
  Filled 2023-11-27: qty 30, 30d supply, fill #0

## 2023-11-27 MED ORDER — TAMSULOSIN HCL 0.4 MG PO CAPS
0.4000 mg | ORAL_CAPSULE | Freq: Every day | ORAL | 0 refills | Status: AC
  Filled 2023-11-27: qty 30, 30d supply, fill #0

## 2023-11-27 NOTE — Progress Notes (Signed)
Speech Language Pathology Discharge Summary  Patient Details  Name: Brian [name] MRN: [id number] Date of Birth: [birth year]  Date of Discharge from SLP service:November 27, 2023  Today's Date: 11/27/2023 SLP Individual Time: 7829-5621 SLP Individual Time Calculation (min): 39 min  Skilled Therapeutic Interventions:  SLP conducted skilled therapy session targeting cognitive retaining goals and discharge discussion. Patient reports that he is excited about returning home but notes independently that it will take some time to transition back to his previous environment and transition skills learned to promote success. SLP guided patient through mildly complex cognitive task with patient requiring min assist at first to orient to all task parameters, with assistance fading to supervision quickly. Patient recalls information about previous days here and about therapist with supervision and states physical/cognitive deficits with supervision in regard to return back home. Patient was left in chair with call bell in reach and chair alarm set. Patient is appropriate for discharge. See full summary below.    Patient has met 4 of 4 long term goals.  Patient to discharge at overall Supervision level.  Reasons goals not met: n/a   Clinical Impression/Discharge Summary: Patient has made excellent progress towards therapy goals this admission, meeting 4/4 long term goals set. Patient is discharging at an overall supervision level for all basic to mildly complex cognitive tasks, though sometimes can require min assist for orientation to task parameters for unfamiliar tasks. Patient would benefit from outpatient SLP services upon return home to maximize cognitive function at next venue of care. Patient and family education complete. Patient is appropriate for discharge.   Care Partner:  Caregiver Able to Provide Assistance: Yes  Type of Caregiver Assistance: Cognitive  Recommendation:  Outpatient SLP   Rationale for SLP Follow Up: Maximize cognitive function and independence   Equipment: n/a   Reasons for discharge: Discharged from hospital   Patient/Family Agrees with Progress Made and Goals Achieved: Yes   Jeannie Done, M.A., CCC-SLP   [name] 11/27/2023, 8:26 AM

## 2023-11-27 NOTE — Progress Notes (Signed)
Physical Therapy Session Note  Patient Details  Name: Brian [name] MRN: [id number] Date of Birth: [birth year]  Today's Date: 11/27/2023 PT Individual Time: 0865-7846 PT Individual Time Calculation (min): 90 min   Short Term Goals: Week 2:  PT Short Term Goal 1 (Week 2): Pt will ambulate 77' with CGA with LRAD PT Short Term Goal 1 - Progress (Week 2): Met PT Short Term Goal 2 (Week 2): Pt will consistently complete transfers with CGA with LRAD PT Short Term Goal 2 - Progress (Week 2): Met PT Short Term Goal 3 (Week 2): Pt will complete up/down 5" curb with min assist with LRAD PT Short Term Goal 3 - Progress (Week 2): Progressing toward goal  Skilled Therapeutic Interventions/Progress Updates:    Patient seen for lymphedema measurements and wrapping.  Discussed with patient plan for follow up at [facility] for lymphedema follow up.  Encouraged to leave wraps on till late Wednesday (12/11) or early Thursday (12/12) and remove hoping he gets an appointment for follow up soon.  Noted some areas on 4th and 5th toes on R foot with discoloration though pt continues to report no pain.  Wraps removed, legs cleansed and Eucerin applied and new stockinette and molelast placed and same Artiflex and comprilan wraps on both legs placed moving wraps up a little closer to knees due to noted edema pushing up above wraps just distal to knees.  Measurements as follows:                                                    L                                              R               Great Toe                    11 cm                                       11 cm             MTP's                          27.1 cm                                    27.5 cm             10 cm up from MTP    32.8 cm                                    32.5 cm             At malleoli                   30.0 cm  30.0 cm                        10 cm up from mall     26.4 cm                                     29.0 cm             10 cm up                     32.8 cm                                   36.6 cm                        10 cm up                     38.0 cm                                   43.5 cm  Therapy Documentation Precautions:  Precautions Precautions: Fall Precaution Comments: incontinence Restrictions Weight Bearing Restrictions: No  Pain: Pain Assessment Pain Scale: 0-10 Pain Score: 0-No pain     Therapy/Group: Individual Therapy  Elray Mcgregor 11/27/2023, 5:17 PM  Sheran Lawless, PT Acute Rehabilitation Services [phone] 11/27/2023

## 2023-11-27 NOTE — Progress Notes (Signed)
Inpatient Rehabilitation Discharge Medication Review by a Pharmacist  A complete drug regimen review was completed for this patient to identify any potential clinically significant medication issues.  High Risk Drug Classes Is patient taking? Indication by Medication  Antipsychotic No   Anticoagulant No   Antibiotic No   Opioid No   Antiplatelet No   Hypoglycemics/insulin No   Vasoactive Medication Yes Carvedilol - hypertension, tachycardia  Chemotherapy No   Other Yes Tamsulosin - BPH Diclofenac gel (L knee) - topical pain relief Folic acid, thiamine, multivitamin - supplements  PRN: Lactulose - hyperammonemia, target 3 stools per day     Type of Medication Issue Identified Description of Issue Recommendation(s)  Drug Interaction(s) (clinically significant)     Duplicate Therapy     Allergy     No Medication Administration End Date     Incorrect Dose     Additional Drug Therapy Needed     Significant med changes from prior encounter (inform family/care partners about these prior to discharge). Staying off prior Amlodipine, Fish Oil. Had been on Midodrine but weaned to off on 12/3. New: carvedilol, tamsulosin, diclofenac gel, folic acid Communicate changes with patient/family prior to discharge.  Other       Clinically significant medication issues were identified that warrant physician communication and completion of prescribed/recommended actions by midnight of the next day:  Yes  Name of provider notified for urgent issues identified:  - Harvel Ricks, PA-C  Provider Method of Notification: secure chat  Pharmacist comments:  - ammonia trended up again on 12/9. Noted plan to titrate Lactulose to 3-4 stools per day.  Time spent performing this drug regimen review (minutes):  [address]   Dennie Fetters, [place] 11/27/2023 11:54 AM

## 2023-11-27 NOTE — Progress Notes (Signed)
Physical Therapy Discharge Summary  Patient Details  Name: Brian [name] MRN: [id number] Date of Birth: 1961/07/13  Date of Discharge from PT service:November 27, 2023  Today's Date: 11/27/2023 PT Individual Time: 0950-1045 PT Individual Time Calculation (min): 55 min    Patient has met 7 of 8 long term goals due to improved activity tolerance, improved balance, improved postural control, increased strength, and increased range of motion.  Patient to discharge at an ambulatory level  CGA .   Patient's care partner is independent to provide the necessary physical assistance at discharge.  Reasons goals not met: pt requires min assist for sit to supine however pt reports sleeps in recliner at baseline - adequate for DC  Recommendation:  Patient will benefit from ongoing skilled PT services in home health setting to continue to advance safe functional mobility, address ongoing impairments in BLE strengthening, dynamic standing balance, gait mechanics, and minimize fall risk.  Equipment: Tub transfer bench  Reasons for discharge: treatment goals met and discharge from hospital  Patient/family agrees with progress made and goals achieved: Yes  PT Discharge Skilled Treatment Interventions and Progress Updates: Pt presents in room seated in WC, agreeable to PT, denies pain. Session focused on gait training for tolerance to upright, navigating uneven terrain, and stair negotiation as well as transfer training for car transfers, bed mobility, and sit<>stands.  Pt transported to main gym dependently for time management. Pt completes sit<>stand with supervision throughout session WC to RW. Pt ambulates with RW 190' with RW CGA for clothing management only, demonstrating no LOB and improving upright posture however pt continues to demonstrate heavy UE reliance on RW.   After seated rest break pt then completes up/down 6" step with BHRs, CGA, ascending/descending leading with RLE. Pt transported  to ortho gym dependently for time management. Pt then ambulates up/down ramp with verbal cues for RW mgmt over threshold due to pt getting RW caught on threshold in previous attempt, completes with CGA and good RW mgmt with min cues. Pt then completes stand step transfer to car with supervision for transfer, cues for hand placement and managing BLEs into/out of car with CGA for BLE mgmt. Pt completes sit>stand from car to RW with CGA and cues for hand placement, completes transfer back to WC.  Pt transported back to room. Pt completes wc>bed transfer with supervision using RW. Pt completes sit to supine with min assist for BLEs into bed due to insufficient strength, pt reports sleeping in recliner at baseline. Pt completes sit to supine modI. Pt completes stand pivot back to [facility] with supervision, provided with verbal and visual cues for using BLEs on sitting surface to push up to standing from lower surface for improved body mechanics with pt reporting understanding. Pt transferred back to [facility] and remains seated in [place] with all needs within reach, call light in place, chair alarm activated at end of session.  Precautions/Restrictions Precautions Precautions: Fall Restrictions Weight Bearing Restrictions: No Pain Interference Pain Interference Pain Effect on Sleep: 1. Rarely or not at all Pain Interference with Therapy Activities: 2. Occasionally Pain Interference with Day-to-Day Activities: 1. Rarely or not at all Cognition Overall Cognitive Status: Impaired/Different from baseline Arousal/Alertness: Awake/alert Orientation Level: Oriented X4 Year: 2024 Month: December Day of Week: Correct Attention: Focused;Sustained Focused Attention: Appears intact Sustained Attention: Appears intact Selective Attention: Impaired Selective Attention Impairment: Functional complex Memory: Appears intact Awareness: Impaired Awareness Impairment: Emergent impairment Problem Solving Impairment: Verbal  complex;Functional complex Initiating: Appears intact Self Monitoring: Impaired  Self Monitoring Impairment: Functional complex;Verbal complex Safety/Judgment: Appears intact Sensation Sensation Light Touch: Appears Intact Hot/Cold: Appears Intact Proprioception: Not tested Stereognosis: Not tested Coordination Gross Motor Movements are Fluid and Coordinated: No Fine Motor Movements are Fluid and Coordinated: No Coordination and Movement Description: minor decrease in smoothness and accuracy, improved from eval Motor  Motor Motor: Other (comment) Motor - Discharge Observations: generalized weakness, improved from eval  Mobility Bed Mobility Bed Mobility: Rolling Left;Rolling Right;Supine to Sit;Sit to Supine Rolling Right: Independent Rolling Left: Independent Supine to Sit: Independent Sit to Supine: Minimal Assistance - Patient > 75% Transfers Transfers: Stand to Sit;Sit to Stand;Stand Pivot Transfers Sit to Stand: Supervision/Verbal cueing Stand to Sit: Supervision/Verbal cueing Stand Pivot Transfers: Supervision/Verbal cueing Transfer (Assistive device): Rolling walker Locomotion  Gait Ambulation: Yes Gait Assistance: Contact Guard/Touching assist Gait Distance (Feet): 195 Feet Assistive device: Rolling walker Gait Gait: Yes Gait Pattern: Impaired Gait Pattern: Decreased step length - left;Decreased step length - right;Step-through pattern;Trunk flexed (increased UE weightbearing on RW) Gait velocity: decreased Stairs / Additional Locomotion Stairs: Yes Stairs Assistance: Contact Guard/Touching assist Stair Management Technique: Backwards;Forwards;Two rails Number of Stairs: 1 Height of Stairs: 6 Ramp: Contact Guard/touching assist Pick up small object from the floor (from standing position) activity did not occur: Safety/medical concerns Wheelchair Mobility Wheelchair Mobility: No  Trunk/Postural Assessment  Cervical Assessment Cervical Assessment: Within  Functional Limits Thoracic Assessment Thoracic Assessment: Exceptions to [facility] (rounded shoulders) Lumbar Assessment Lumbar Assessment: Exceptions to [facility] (posterior pelvic tilt) Postural Control Postural Control: Deficits on evaluation Righting Reactions: delayed and inadequate Protective Responses: decreased Postural Limitations: decreased  Balance Balance Balance Assessed: Yes Static Sitting Balance Static Sitting - Balance Support: Feet supported Static Sitting - Level of Assistance: 6: Modified independent (Device/Increase time) Dynamic Sitting Balance Dynamic Sitting - Balance Support: Feet supported Dynamic Sitting - Level of Assistance: 6: Modified independent (Device/Increase time) Static Standing Balance Static Standing - Balance Support: Bilateral upper extremity supported Static Standing - Level of Assistance: 5: Stand by assistance Dynamic Standing Balance Dynamic Standing - Balance Support: Bilateral upper extremity supported;During functional activity Dynamic Standing - Level of Assistance: 5: Stand by assistance Extremity Assessment  RLE Assessment RLE Assessment: Exceptions to [facility] General Strength Comments: tested in sitting RLE Strength Right Hip Flexion: 3-/5 Right Hip Extension: 3+/5 Right Knee Flexion: 4/5 Right Knee Extension: 3+/5 Right Ankle Dorsiflexion: 3+/5 Right Ankle Plantar Flexion: 3/5 LLE Assessment LLE Assessment: Exceptions to [facility] General Strength Comments: tested in sitting LLE Strength Left Hip Flexion: 3+/5 Left Hip Extension: 3+/5 Left Knee Flexion: 4-/5 Left Knee Extension: 4/5 Left Ankle Dorsiflexion: 4-/5 Left Ankle Plantar Flexion: 3/5   Edwin Cap PT, DPT 11/27/2023, 10:44 AM

## 2023-11-27 NOTE — Progress Notes (Signed)
Occupational Therapy Discharge Summary  Patient Details  Name: Brian [name] MRN: [id number] Date of Birth: 08-25-1961  Date of Discharge from OT service:November 27, 2023  Today's Date: 11/27/2023 OT Individual Time: 1400-1445 1st Session; 1400-1445 2nd Session  OT Individual Time Calculation (min): 45 min, 45 min    Patient has met 8 of 8 long term goals due to improved activity tolerance, improved balance, postural control, ability to compensate for deficits, functional use of  RIGHT upper, RIGHT lower, LEFT upper, and LEFT lower extremity, improved attention, improved awareness, and improved coordination.  Patient to discharge at [facility] Assist level.  Patient's care partner is independent to provide the necessary physical assistance at discharge.    Reasons goals not met: n/a   Recommendation:  Patient will benefit from ongoing skilled OT services in home health setting to continue to advance functional skills in the area of BADL, iADL, and Reduce care partner burden.  Equipment: TTB, LH sponge   Reasons for discharge: treatment goals met  Patient/family agrees with progress made and goals achieved: Yes  OT Discharge Precautions/Restrictions  Precautions Precautions: Fall Restrictions Weight Bearing Restrictions: No General   Vital Signs Therapy Vitals Temp: 98.4 F (36.9 C) Pulse Rate: 74 Resp: 16 BP: 127/62 Patient Position (if appropriate): Sitting Oxygen Therapy SpO2: 97 % O2 Device: Room Air Pain Pain Assessment Pain Scale: 0-10 Pain Score: 0-No pain ADL ADL Equipment Provided: Long-handled sponge Eating: Independent Where Assessed-Eating: Wheelchair Grooming: Setup Where Assessed-Grooming: Sitting at sink, Standing at sink Upper Body Bathing: Supervision/safety Where Assessed-Upper Body Bathing: Sitting at sink, Shower Lower Body Bathing: Contact guard Where Assessed-Lower Body Bathing: Shower, Sitting at sink, Standing at sink Upper Body  Dressing: Setup Where Assessed-Upper Body Dressing: Wheelchair, Sitting at sink Lower Body Dressing: Contact guard Where Assessed-Lower Body Dressing: Sitting at sink, Standing at sink, Wheelchair Toileting: Setup Where Assessed-Toileting: Teacher, adult education: Furniture conservator/restorer Method: Proofreader: Dealer Method: Ambulating, Stand pivot Tub/Shower Equipment: Insurance underwriter: Administrator, arts Method: Ambulating, Stand pivot Astronomer: Shower seat with back ADL Comments: Completed full shower, toileting, dressing routine this session withonly physical assist for socks over wraps Vision Baseline Vision/History: 0 No visual deficits Vision Assessment?: No apparent visual deficits Perception  Perception: Within Functional Limits Praxis Praxis: WFL Cognition Cognition Overall Cognitive Status: Impaired/Different from baseline Arousal/Alertness: Awake/alert Orientation Level: Person;Place;Situation Memory: Appears intact Focused Attention: Appears intact Sustained Attention: Appears intact Selective Attention: Impaired Selective Attention Impairment: Functional complex Awareness: Impaired Problem Solving: Impaired Problem Solving Impairment: Verbal complex;Functional complex Executive Function: Initiating;Self Monitoring Initiating: Appears intact Self Monitoring: Impaired Safety/Judgment: Appears intact Brief Interview for Mental Status (BIMS) Repetition of Three Words (First Attempt): 3 Temporal Orientation: Year: Correct Temporal Orientation: Month: Accurate within 5 days Temporal Orientation: Day: Correct Recall: "Sock": Yes, no cue required Recall: "Blue": Yes, no cue required Recall: "Bed": Yes, no cue required BIMS Summary Score: 15 Sensation Sensation Hot/Cold: Appears Intact Proprioception: Appears  Intact Stereognosis: Appears Intact Coordination Gross Motor Movements are Fluid and Coordinated: No Fine Motor Movements are Fluid and Coordinated: Yes Coordination and Movement Description: minor decrease in smoothness and accuracy, improved from eval Finger Nose Finger Test: [facility] 9 Hole Peg Test: 32 sec R, 34 sec L which is grossly WFL's Motor  Motor Motor - Skilled Clinical Observations: generalized weakness Motor - Discharge Observations: generalized weakness, improved from eval Mobility  Bed Mobility Bed  Mobility: Rolling Left;Rolling Right;Supine to Sit;Sit to Supine Rolling Left: Independent Supine to Sit: Independent Sit to Supine: Contact Guard/Touching assist Transfers Sit to Stand: Supervision/Verbal cueing Stand to Sit: Supervision/Verbal cueing  Trunk/Postural Assessment  Cervical Assessment Cervical Assessment: Within Functional Limits Thoracic Assessment Thoracic Assessment: Exceptions to [facility] Lumbar Assessment Lumbar Assessment: Exceptions to [facility] Postural Control Postural Control: Deficits on evaluation Righting Reactions: delayed and inadequate Protective Responses: decreased  Balance Static Sitting Balance Static Sitting - Balance Support: Feet supported Static Sitting - Level of Assistance: 6: Modified independent (Device/Increase time) Dynamic Sitting Balance Dynamic Sitting - Balance Support: Feet supported Dynamic Sitting - Level of Assistance: 6: Modified independent (Device/Increase time) Sitting balance - Comments: able to correct when balance is challenged Cytogeneticist Standing - Balance Support: Bilateral upper extremity supported Static Standing - Level of Assistance: 5: Stand by assistance Dynamic Standing Balance Dynamic Standing - Balance Support: Bilateral upper extremity supported;During functional activity Dynamic Standing - Level of Assistance: 5: Stand by assistance Extremity/Trunk Assessment RUE Assessment RUE  Assessment: Within Functional Limits General Strength Comments: grip 65 lbs LUE Assessment LUE Assessment: Within Functional Limits General Strength Comments: grip 55 lbs  OT Treatment/Intervention:  Session 1:  Pt seen for skilled OT session with focus on full toileitng and shower routine transfers, mobility and LE functional reach. OT waterproofed B LE wraps prior to mobility with Rw to toilet for bowel and voiding with close s. Transfer to TTB in stall shower and pt use LH sponge for back access. OT provided CGA for buttocks standing with grab bars. UB and hair washing with set up. Throughout education provided for falls prevention, energy conservation and functional reach strategies. Once back in w/c for UB dressing and grooming with set up and CGA for pants donning and total A for slipper socks over wraps. Left pt w/c level with all needs, nurse call button and needs in reach.   Pain: denies all pain   Session 2: Pt seen for final OT session. Pt up in w/c upon OT arrival just finishing use of urinal. See Flowsheet for data and pt mid I strategies for use. Pt able to stand from w/c sink side for hand washing and oral care up to 2 min with close S. OT transported pt to middle gym for Grip and 9 HPT. Back in room completed BIMS and short distance amb back to bed as per request of nurse to complete wrapping B LE's for lymphedema. Pt used RW 20 ft with close s to EOB and able to bring legs into bed with min cues. Repositioned with LE's elevated. Reinforced light UE HEP and ankle pumps with + teach back. No further skilled OT needs at CIR with rec for HHOT. Left pt bed level with all needs and nurse call button in reach and safety measured in place.    Pain: denies all pain    Vicenta Dunning 11/27/2023, 4:45 PM

## 2023-11-27 NOTE — Plan of Care (Signed)
  Problem: RH Balance Goal: LTG: Patient will maintain dynamic sitting balance (OT) Description: LTG:  Patient will maintain dynamic sitting balance with assistance during activities of daily living (OT) Outcome: Completed/Met Goal: LTG Patient will maintain dynamic standing with ADLs (OT) Description: LTG:  Patient will maintain dynamic standing balance with assist during activities of daily living (OT)  Outcome: Completed/Met   Problem: Sit to Stand Goal: LTG:  Patient will perform sit to stand in prep for activites of daily living with assistance level (OT) Description: LTG:  Patient will perform sit to stand in prep for activites of daily living with assistance level (OT) Outcome: Completed/Met   Problem: RH Dressing Goal: LTG Patient will perform upper body dressing (OT) Description: LTG Patient will perform upper body dressing with assist, with/without cues (OT). Outcome: Completed/Met Goal: LTG Patient will perform lower body dressing w/assist (OT) Description: LTG: Patient will perform lower body dressing with assist, with/without cues in positioning using equipment (OT) Outcome: Completed/Met   Problem: RH Toileting Goal: LTG Patient will perform toileting task (3/3 steps) with assistance level (OT) Description: LTG: Patient will perform toileting task (3/3 steps) with assistance level (OT)  Outcome: Completed/Met   Problem: RH Toilet Transfers Goal: LTG Patient will perform toilet transfers w/assist (OT) Description: LTG: Patient will perform toilet transfers with assist, with/without cues using equipment (OT) Outcome: Completed/Met

## 2023-11-27 NOTE — Plan of Care (Signed)
  Problem: RH Cognition - SLP Goal: RH LTG Patient will demonstrate orientation with cues Description:  LTG:  Patient will demonstrate orientation to person/place/time/situation with cues (SLP)   Outcome: Completed/Met   Problem: RH Problem Solving Goal: LTG Patient will demonstrate problem solving for (SLP) Description: LTG:  Patient will demonstrate problem solving for basic/complex daily situations with cues  (SLP) Outcome: Completed/Met   Problem: RH Memory Goal: LTG Patient will use memory compensatory aids to (SLP) Description: LTG:  Patient will use memory compensatory aids to recall biographical/new, daily complex information with cues (SLP) Outcome: Completed/Met   Problem: RH Awareness Goal: LTG: Patient will demonstrate awareness during functional activites type of (SLP) Description: LTG: Patient will demonstrate awareness during functional activites type of (SLP) Outcome: Completed/Met

## 2023-11-27 NOTE — Progress Notes (Signed)
PROGRESS NOTE   Subjective/Complaints: No events overnight.  No acute complaints.  Patient asking when lymphedema wraps will be changed out. Vitals stable. Labs with mildly reduced Na 133, T bili downtrending, ammonia pending.  Last BM 12/7, medium. All continent this weekend.   ROS: + bowel incontinence, -improved   bladder incontinence -improved Bilateral lower extremity swelling-stable with lymphedema wraps-ongoing  denies fevers, chills, N/V, abdominal pain, constipation, , SOB, cough, chest pain, headache, new weakness or new sensory changes or paraesthesias.    Objective:   No results found. Recent Labs    11/27/23 0527  WBC 4.2  HGB 10.5*  HCT 30.7*  PLT 170      Recent Labs    11/27/23 0527  NA 133*  K 3.6  CL 104  CO2 21*  GLUCOSE 133*  BUN 11  CREATININE 0.53*  CALCIUM 8.7*       Intake/Output Summary (Last 24 hours) at 11/27/2023 0724 Last data filed at 11/27/2023 0981 Gross per 24 hour  Intake 477 ml  Output 2500 ml  Net -2023 ml     Pressure Injury 11/08/23 Heel Right Deep Tissue Pressure Injury - Purple or maroon localized area of discolored intact skin or blood-filled blister due to damage of underlying soft tissue from pressure and/or shear. (Active)  11/08/23 1600  Location: Heel  Location Orientation: Right  Staging: Deep Tissue Pressure Injury - Purple or maroon localized area of discolored intact skin or blood-filled blister due to damage of underlying soft tissue from pressure and/or shear.  Wound Description (Comments):   Present on Admission: Yes    Physical Exam: Vital Signs Blood pressure 125/69, pulse 81, temperature 98.8 F (37.1 C), temperature source Oral, resp. rate 18, height 5\' 11"  (1.803 m), weight 119.9 kg, SpO2 95%.  Constitutional: No apparent distress. Appropriate appearance for age.  Morbidly obese.  Sitting up at bedside. HENT: EOMI. NCAT.  Glasses  donned. Cardiovascular: RRR, no murmurs/rub/gallops. 2-3+ bilateral pitting and lymphedema R>L, -unchanged from prior, wrapped bilaterally Respiratory: CTAB. No rales, rhonchi, or wheezing.  Abdomen: + bowel sounds hypoactive, nondistended, no  tenderness on palpation. Skin:  + Bilateral chronic lower extremity skin thickening and discolorations-stable + Bilateral buttocks and coccygeal MASD-not examined + Right heel deep tissue injury, unstageable, with underlying bruise-see photos 12-4, stable appearance with skin softening over top -palpable skin overlying 12-9 + Left heel bruising seen on photos 12/4; mild, obscured by overlying thickened skin; palpable skin overlying 12-9           MSK:     No joint deformity noted.  Full active range of motion all 4 extremities.  Neurologic exam:  Cognition: Alert and awake.  Oriented x 3.  Follows basic commands.  Difficulty with higher-level cognitive tasks. + Cognitive slowing, memory deficits-at baseline, much improved from admission  Strength: Antigravity and against resistance in all 4 extremities, 5- out of 5 proximally, 5 out of 5 distally-stable 12/9 Mood: Pleasant affect, appropriate mood.  Sensation: Grossly intact.  Assessment/Plan: 1. Functional deficits which require 3+ hours per day of interdisciplinary therapy in a comprehensive inpatient rehab setting. Physiatrist is providing close team supervision and 24 hour management of active  medical problems listed below. Physiatrist and rehab team continue to assess barriers to discharge/monitor patient progress toward functional and medical goals  Care Tool:  Bathing    Body parts bathed by patient: Right arm, Left arm, Chest, Abdomen, Front perineal area, Right upper leg, Buttocks, Left upper leg, Right lower leg, Left lower leg, Face   Body parts bathed by helper: Front perineal area, Buttocks, Right upper leg, Left upper leg, Right lower leg, Left lower leg     Bathing  assist Assist Level: Minimal Assistance - Patient > 75%     Upper Body Dressing/Undressing Upper body dressing   What is the patient wearing?: Pull over shirt    Upper body assist Assist Level: Set up assist    Lower Body Dressing/Undressing Lower body dressing      What is the patient wearing?: Pants, Incontinence brief     Lower body assist Assist for lower body dressing: Maximal Assistance - Patient 25 - 49%     Toileting Toileting    Toileting assist Assist for toileting: Maximal Assistance - Patient 25 - 49%     Transfers Chair/bed transfer  Transfers assist  Chair/bed transfer activity did not occur: Safety/medical concerns (Unsafet to get out of bed.)  Chair/bed transfer assist level: Contact Guard/Touching assist     Locomotion Ambulation   Ambulation assist   Ambulation activity did not occur: Safety/medical concerns  Assist level: Contact Guard/Touching assist Assistive device: Walker-rolling Max distance: 175'   Walk 10 feet activity   Assist  Walk 10 feet activity did not occur: Safety/medical concerns  Assist level: Contact Guard/Touching assist Assistive device: Walker-rolling   Walk 50 feet activity   Assist Walk 50 feet with 2 turns activity did not occur: Safety/medical concerns  Assist level: Contact Guard/Touching assist Assistive device: Walker-rolling    Walk 150 feet activity   Assist Walk 150 feet activity did not occur: Safety/medical concerns  Assist level: Contact Guard/Touching assist Assistive device: Walker-rolling    Walk 10 feet on uneven surface  activity   Assist Walk 10 feet on uneven surfaces activity did not occur: Safety/medical concerns         Wheelchair     Assist Is the patient using a wheelchair?: Yes Type of Wheelchair: Manual    Wheelchair assist level: Moderate Assistance - Patient 50 - 74% Max wheelchair distance: 73'    Wheelchair 50 feet with 2 turns  activity    Assist        Assist Level: Moderate Assistance - Patient 50 - 74%   Wheelchair 150 feet activity     Assist      Assist Level: Maximal Assistance - Patient 25 - 49%   Blood pressure 125/69, pulse 81, temperature 98.8 F (37.1 C), temperature source Oral, resp. rate 18, height 5\' 11"  (1.803 m), weight 119.9 kg, SpO2 95%.  Medical Problem List and Plan: 1. Functional deficits secondary to acute encephalopathy, hepatic and possible metabolic component             -patient may shower             -ELOS/Goals: 10-14 days SPV, downgrade to CGA PT 12/3 - DC 11/28/23              - Continue CIR  -11/26: Wife not returning calls per SLP. Remains Max A for LBD, min-mod transfers, walking 35 feet with RW Mod A with +2 WC follow. Min A for basic problem solving, Mod A for memory and  awareness.   12/3: Got ahold of wife, she is unable to provide any physical assistance and no one in the home for PSV assist. Needs Stedy for most transfers; CGA to Max A for toiletting depending on session. Min A bathing. PT CGA-Min A transfers with RW, walking up to 45 feet; did a 3 inch step yesterday with Min A. SLP Min A for problem solving, Mod for complex - feel he is close to baseline for cog.   2.  Antithrombotics: -DVT/anticoagulation:  Pharmaceutical: Lovenox             -antiplatelet therapy: N/A 3.Left knee pain: XR ordered, continue voltaren gel    - 11/21: No complaints today. 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  May benefit from hinged knee brace when weightbearing, or injection if medically stable.  Monitor today for therapy assessments.  - 11/22: With PT, denied pain during session yesterday with ambulation and transfers. No adjustments at this time.   4. Mood/Behavior/Sleep: Provide emotional support             -antipsychotic agents: N/A  11-26: See below, applying overnight pulse ox to screen for OSA -approximately 4 events per  hour, lowest desat in the 80s, not diagnostic of OSA.  Will not pursue aggressively water. -11/30 PRN melatonin  -12/1 sleeping better last night, continue current Insomnia resolved  5. Neuropsych/cognition: This patient is capable of making decisions on his own behalf.  6. Skin/Wound Care: Routine skin checks..Excoriation of buttocks with wound care as directed -Low-air-loss mattress, Mepilex dressing to bilateral heels to prevent breakdown--add Prevalon boot RLE 11/22; never applied, re-ordered 11/27 bilaterally d/t boggy heels  -Goodhart's Butt cream to prevent worsening MASD -Hydrocyn cream to bilateral legs for flaking, dry skin -11-23: Nursing order to increase cream to twice daily, over legs and feet, and to apply Ace wrapping from feet to mid shin to prevent worsening dependent edema. 11-24: Much improved appearance of bilateral lower extremities, continue regimen of wrapping and hydrating cream. 11-26: Lymphedema wraps now on.  Concern regarding ongoing monitoring of right heel DTI with these wraps; prevalon boots as above.  11/27 discussed reason for Prevalon boot use-patient has been compliant  -12/5: Per WOCN assessment, agree R heel DTI will likely evolve into full-thickness ulcer once skin softens. For home management, Apply Xeroform gauze to right heel wound Q day and cover with foam dressing.  Change foam dressing Q 3 days or PRN soiling.  Use Prevalon boot to reduce pressure to heel when in bed.     7. Fluids/Electrolytes/Nutrition: Routine in and outs with follow-up chemistries  -11-21: Sodium 133, repeat in a.m. for trend.  Otherwise labs stable.  11-22: Improved 134 today; next labs on Monday, monitor for cognitive changes  -Sodium up to 136 on 11/25  -12-2: Sodium 139; other labs stable.  P.o. intake adequate.  Monitor.  12-5: Sodium, BUN/creatinine stable.  Albumin increasing.  12/9: Mild hyponatremia, no acute intervention.  8.  Colitis versus ileitis-acute  appendicitis ruled out.  Follow-up outpatient general surgery  11-24: Remains with frequent loose bowel movements overnight.  Lactulose held/as needed.  Will need to continue targeted bowel movements at least 2 to 3/day, however did add Imodium for excessive bowel movements.  No current concern for infection, vital stable and no white count.  11-25/26: Unchanged, ongoing.  Questional contribution of cognitive deficits.  Bowel movements do seem to be slowing down.  12-2-3: Once daily liquid to soft bowel movements, remain  incontinent - resolved  9.  Hypertension/hypotension.  Blood pressure remains a bit soft remains on ProAmatine 10 mg 3 times daily as well as low-dose Coreg 12.5 mg twice daily.   - 11/21: Diastolic hypotension as below; resume midodrine 5 mg 2 times daily and wean as tolerated -stable 11-23  11/29 BP  well-controlled, continue current regimen.  Continue midodrine  12/1 BP controlled, continue current regimen  12-2: Has remained normotensive in mid 30s; decrease midodrine to 2.5 mg twice daily, monitor 1 to 2 days before discontinuing  12-3: Has remained normotensive, discontinue midodrine  12/4-5: Stable/normotensive off of midodrine.  Continue coreg - stable on current regimen     11/27/2023    5:31 AM 11/27/2023    5:04 AM 11/26/2023    7:20 PM  Vitals with BMI  Weight 264 lbs 5 oz    BMI 36.88    Systolic  125 118  Diastolic  69 54  Pulse  81 80    10.  Chronic lymphedema.  Follow-up outpatient.  -PT order for lymphedema wrapping and assistance and education, management 11-22 -11-23: Per PT, only 1 PT in the hospital provide lymphedema management, looking into getting them in the room to assist in education this coming week -11/24 nursing providing Ace wrapping to bilateral lower extremities, does appear to be helping. 11/25 reports gradually improving, continue current regimen 11-26: PT Elray Mcgregor applied lymphedema wraps and educated patient on care 11-25; very  much appreciate her help in this!  Patient comfortable, continue current management; PT to follow-up for tolerance and rewrap as needed and follow measurements until stable. 12/1 PT reports improving, continue current regimen -lymphedema wraps to be redone 12-2, with ongoing improvement. 12-3: Per PT, could benefit from toenail cutting for wrapping, unfortunately not available in house.  Discussed with patient outpatient follow-up for toenail management. 12/9: Per PT, wraps changed, encouraged to leave wraps on till late Wednesday (12/11) or early Thursday (12/12) and remove hoping he gets an appointment for follow up soon.  Will need outpatient referral for lymphedema management; order placed  11.History of alcohol use.  Counseling  12.  Tobacco use.  Counseling  13.  Obesity.  BMI 37.08.  Dietary follow-up  14.  Prediabetes.  Hemoglobin A1c 5.8. d/c ISS and start metformin ER release with dinner, creatinine reviewed and is stable -Blood sugars well-controlled, DC BG checks  11/25 glucose stable on BMP  15.  BPH/bladder incontinence/bowel incontinence.  Flomax 0.4 mg daily. BP reviewed and is stable -resolved  -11-23: DC Flomax, continue timed toileting and PVRs  11-24: PVRs remain low, remains continent during the day and incontinent at night.  Continue timed toileting.  11-26: Uncertain what barriers to continence are at this time, patient states that he sleeps through episodes of incontinence although he does feel the urge to go and knows to call the nurse for assistance.  Labs stable as above.  Will apply overnight pulse ox to assess for OSA/disordered sleep.  11-27: OSA evaluation nondiagnostic as above.  Incontinence that seems to be improving as bowel movements slow down.  Continue timed toileting and monitoring.  11/30 Occasional incontinence,  recheck PVR- 193, 220. Will retry flomax  12-2: Ongoing incontinence, PVRs mid 100s.  continue Flomax  12/3: PVRs stable, DC   12-4: Some  improved continence overnight! Ongoing 12/5-6  12/8: All continent this weekend   16. Hyperammonemia.  Hepatic steatosis seen on intake CT.  - 11/21: Seems more encephalopathic today, last ammonia was increased  from 40s to 60s, add on to a.m. labs and resume lactulose 7.5 mg twice daily if worsening  11-22: Ammonia within normal limits.  Continues having 4-5 stools per day.  Change lactulose to 10 mg twice daily as needed to target at least 3 stools per day.  Cognition does seem to be improving.  11-30 continue current dose lactulose 10g BID PRN  12/4: Some mild cognitive deficits this a.m., which resolved quickly with improved arousal and stimulation.  Repeat labs, ammonia in AM - pending  12-5: Per SLP, cognition improved back to baseline this a.m.   12/6: Labs yesterday significant for ammonia increased from 30s to 55.  T. bili also increased, as expected.  Discussed with patient need for as needed lactulose to reduce ammonia, titrating to 3 bowel movements daily.  Informed nursing as well to administer.  Recheck on a.m. labs Monday.  12/9: -Ammonia increased to 79, t bili down, as needed lactulose administered today.  Will educate in a.m. on titrating towards 3-4 bowel movements per day, as he does not appear to have been getting as needed over the weekend.  17. Anemia.  hemoglobin decreased on intake from 12,6 to 10.7.  FOBT, H&H in AM. -11-22: Hemoglobin improved to 11.4, FOBT pending but no obvious blood in stool. - FOBT negative  - Hemoglobin stable 12-2 - recheck 12/5, 12/9 stable  18. Dry skin: continue Eucerin  LOS: 19 days A FACE TO FACE EVALUATION WAS PERFORMED  Angelina Sheriff 11/27/2023, 7:24 AM

## 2023-11-27 NOTE — Plan of Care (Signed)
  Problem: RH Bed Mobility Goal: LTG Patient will perform bed mobility with assist (PT) Description: LTG: Patient will perform bed mobility with assistance, with/without cues (PT). Outcome: Adequate for Discharge Note: Able to complete supine to sit modI, requires min assist for sit to supine for BLEs into bed however pt reports sleeps in recliner at baseline   Problem: RH Balance Goal: LTG Patient will maintain dynamic standing balance (PT) Description: LTG:  Patient will maintain dynamic standing balance with assistance during mobility activities (PT) Outcome: Completed/Met   Problem: Sit to Stand Goal: LTG:  Patient will perform sit to stand with assistance level (PT) Description: LTG:  Patient will perform sit to stand with assistance level (PT) Outcome: Completed/Met   Problem: RH Bed to Chair Transfers Goal: LTG Patient will perform bed/chair transfers w/assist (PT) Description: LTG: Patient will perform bed to chair transfers with assistance (PT). Outcome: Completed/Met   Problem: RH Car Transfers Goal: LTG Patient will perform car transfers with assist (PT) Description: LTG: Patient will perform car transfers with assistance (PT). Outcome: Completed/Met   Problem: RH Ambulation Goal: LTG Patient will ambulate in controlled environment (PT) Description: LTG: Patient will ambulate in a controlled environment, # of feet with assistance (PT). Outcome: Completed/Met Goal: LTG Patient will ambulate in home environment (PT) Description: LTG: Patient will ambulate in home environment, # of feet with assistance (PT). Outcome: Completed/Met   Problem: RH Stairs Goal: LTG Patient will ambulate up and down stairs w/assist (PT) Description: LTG: Patient will ambulate up and down # of stairs with assistance (PT) Outcome: Completed/Met

## 2023-11-28 ENCOUNTER — Other Ambulatory Visit (HOSPITAL_COMMUNITY): Payer: Self-pay

## 2023-11-28 MED ORDER — THIAMINE HCL 100 MG PO TABS
100.0000 mg | ORAL_TABLET | Freq: Every day | ORAL | 0 refills | Status: AC
  Filled 2023-11-28: qty 30, 30d supply, fill #0

## 2023-11-28 NOTE — Progress Notes (Signed)
PROGRESS NOTE   Subjective/Complaints: No events overnight.  No acute complaints.  Reports bowel movement with lactulose yesterday.  Discussed with patient need to take lactulose to titrate bowel movements to 3-4 stools per day, to keep ammonia under control.  He is agreeable with this plan and demonstrates understanding.  Discussed lymphedema wraps, plan for him to remove them on Thursday.  He has no questions or concerns regarding wound care on his right heel.  ROS: + bowel incontinence, -improved   bladder incontinence -improved Bilateral lower extremity swelling-stable with lymphedema wraps-ongoing  denies fevers, chills, N/V, abdominal pain, constipation, , SOB, cough, chest pain, headache, new weakness or new sensory changes or paraesthesias.    Objective:   No results found. Recent Labs    11/27/23 0527  WBC 4.2  HGB 10.5*  HCT 30.7*  PLT 170      Recent Labs    11/27/23 0527  NA 133*  K 3.6  CL 104  CO2 21*  GLUCOSE 133*  BUN 11  CREATININE 0.53*  CALCIUM 8.7*       Intake/Output Summary (Last 24 hours) at 11/28/2023 1246 Last data filed at 11/28/2023 1125 Gross per 24 hour  Intake 712 ml  Output 2650 ml  Net -1938 ml     Pressure Injury 11/08/23 Heel Right Deep Tissue Pressure Injury - Purple or maroon localized area of discolored intact skin or blood-filled blister due to damage of underlying soft tissue from pressure and/or shear. (Active)  11/08/23 1600  Location: Heel  Location Orientation: Right  Staging: Deep Tissue Pressure Injury - Purple or maroon localized area of discolored intact skin or blood-filled blister due to damage of underlying soft tissue from pressure and/or shear.  Wound Description (Comments):   Present on Admission: Yes    Physical Exam: Vital Signs Blood pressure 123/70, pulse 82, temperature 98.5 F (36.9 C), resp. rate 18, height 5\' 11"  (1.803 m), weight  119.9 kg, SpO2 94%.  Constitutional: No apparent distress. Appropriate appearance for age.  Morbidly obese.  Sitting up in bedside chair. HENT: EOMI. NCAT.  Glasses donned. Cardiovascular: RRR, no murmurs/rub/gallops. 2+ bilateral pitting and lymphedema R>L, -unchanged from prior, worse of bilateral feet Respiratory: CTAB. No rales, rhonchi, or wheezing.  Abdomen: + bowel sounds hypoactive, mildly distended, no  tenderness on palpation. Skin:  + Bilateral chronic lower extremity skin thickening and discolorations-stable + Bilateral buttocks and coccygeal MASD-improved 12/10 + Right heel deep tissue injury, unstageable, with underlying bruise-see photos 12-4, stable appearance with skin softening over top -palpable skin overlying 12-10, unchanged + Left heel bruising seen on photos 12/4; mild, obscured by overlying thickened skin; palpable skin overlying 12-10, unchanged         MSK:     No joint deformity noted.  Full active range of motion all 4 extremities.  Neurologic exam:  Cognition: Alert and awake.  Oriented x 3.  Follows basic commands.  Difficulty with higher-level cognitive tasks. + Cognitive slowing, memory deficits-at baseline, much improved from admission  Strength: Antigravity and against resistance in all 4 extremities, 4- out of 5 proximally, 5- out of 5 distally Mood: Pleasant affect, appropriate mood.  Sensation:  intact  in bilateral upper and lower extremities.  Assessment/Plan: 1. Functional deficits which require 3+ hours per day of interdisciplinary therapy in a comprehensive inpatient rehab setting. Physiatrist is providing close team supervision and 24 hour management of active medical problems listed below. Physiatrist and rehab team continue to assess barriers to discharge/monitor patient progress toward functional and medical goals  Care Tool:  Bathing    Body parts bathed by patient: Right arm, Left arm, Chest, Abdomen, Front perineal area, Right  upper leg, Buttocks, Left upper leg, Right lower leg, Left lower leg, Face   Body parts bathed by helper: Front perineal area, Buttocks, Right upper leg, Left upper leg, Right lower leg, Left lower leg     Bathing assist Assist Level: Contact Guard/Touching assist     Upper Body Dressing/Undressing Upper body dressing   What is the patient wearing?: Pull over shirt    Upper body assist Assist Level: Independent with assistive device    Lower Body Dressing/Undressing Lower body dressing      What is the patient wearing?: Pants, Incontinence brief     Lower body assist Assist for lower body dressing: Contact Guard/Touching assist     Toileting Toileting    Toileting assist Assist for toileting: Set up assist     Transfers Chair/bed transfer  Transfers assist  Chair/bed transfer activity did not occur: Safety/medical concerns (Unsafet to get out of bed.)  Chair/bed transfer assist level: Supervision/Verbal cueing     Locomotion Ambulation   Ambulation assist   Ambulation activity did not occur: Safety/medical concerns  Assist level: Contact Guard/Touching assist Assistive device: Walker-rolling Max distance: 195'   Walk 10 feet activity   Assist  Walk 10 feet activity did not occur: Safety/medical concerns  Assist level: Contact Guard/Touching assist Assistive device: Walker-rolling   Walk 50 feet activity   Assist Walk 50 feet with 2 turns activity did not occur: Safety/medical concerns  Assist level: Contact Guard/Touching assist Assistive device: Walker-rolling    Walk 150 feet activity   Assist Walk 150 feet activity did not occur: Safety/medical concerns  Assist level: Contact Guard/Touching assist Assistive device: Walker-rolling    Walk 10 feet on uneven surface  activity   Assist Walk 10 feet on uneven surfaces activity did not occur: Safety/medical concerns   Assist level: Contact Guard/Touching assist Assistive device:  Walker-rolling   Wheelchair     Assist Is the patient using a wheelchair?: No Type of Wheelchair: Manual    Wheelchair assist level: Moderate Assistance - Patient 50 - 74% Max wheelchair distance: 1'    Wheelchair 50 feet with 2 turns activity    Assist        Assist Level: Moderate Assistance - Patient 50 - 74%   Wheelchair 150 feet activity     Assist      Assist Level: Maximal Assistance - Patient 25 - 49%   Blood pressure 123/70, pulse 82, temperature 98.5 F (36.9 C), resp. rate 18, height 5\' 11"  (1.803 m), weight 119.9 kg, SpO2 94%.  Medical Problem List and Plan: 1. Functional deficits secondary to acute encephalopathy, hepatic and possible metabolic component             -patient may shower             -ELOS/Goals: 10-14 days SPV, downgrade to CGA PT 12/3 - DC 11/28/23   -11/26: Wife not returning calls per SLP. Remains Max A for LBD, min-mod transfers, walking 35 feet with RW Mod A with +2  WC follow. Min A for basic problem solving, Mod A for memory and awareness.   12/3: Got ahold of wife, she is unable to provide any physical assistance and no one in the home for PSV assist. Needs Stedy for most transfers; CGA to Max A for toiletting depending on session. Min A bathing. PT CGA-Min A transfers with RW, walking up to 45 feet; did a 3 inch step yesterday with Min A. SLP Min A for problem solving, Mod for complex - feel he is close to baseline for cog.  The patient is medically ready for discharge to home and will need follow-up with [facility] PM[facility]. In addition, they will need to follow up with their PCP,  with likely OP GI referral (deferred to OP evaluation per hospitalist) .    2.  Antithrombotics: -DVT/anticoagulation:  Pharmaceutical: Lovenox             -antiplatelet therapy: N/A 3.Left knee pain: XR ordered, continue voltaren gel    - 11/21: No complaints today. 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  May benefit  from hinged knee brace when weightbearing, or injection if medically stable.  Monitor today for therapy assessments.  - 11/22: With PT, denied pain during session yesterday with ambulation and transfers. No adjustments at this time.   4. Mood/Behavior/Sleep: Provide emotional support             -antipsychotic agents: N/A  11-26: See below, applying overnight pulse ox to screen for OSA -approximately 4 events per hour, lowest desat in the 80s, not diagnostic of OSA.  Will not pursue aggressively water. -11/30 PRN melatonin  -12/1 sleeping better last night, continue current Insomnia resolved  5. Neuropsych/cognition: This patient is capable of making decisions on his own behalf.  6. Skin/Wound Care: Routine skin checks..Excoriation of buttocks with wound care as directed -Low-air-loss mattress, Mepilex dressing to bilateral heels to prevent breakdown--add Prevalon boot RLE 11/22; never applied, re-ordered 11/27 bilaterally d/t boggy heels  -Goodhart's Butt cream to prevent worsening MASD -Hydrocyn cream to bilateral legs for flaking, dry skin -11-23: Nursing order to increase cream to twice daily, over legs and feet, and to apply Ace wrapping from feet to mid shin to prevent worsening dependent edema. 11-24: Much improved appearance of bilateral lower extremities, continue regimen of wrapping and hydrating cream. 11-26: Lymphedema wraps now on.  Concern regarding ongoing monitoring of right heel DTI with these wraps; prevalon boots as above.  11/27 discussed reason for Prevalon boot use-patient has been compliant  -12/5: Per WOCN assessment, agree R heel DTI will likely evolve into full-thickness ulcer once skin softens. For home management, Apply Xeroform gauze to right heel wound Q day and cover with foam dressing.  Change foam dressing Q 3 days or PRN soiling.  Use Prevalon boot to reduce pressure to heel when in bed.     7. Fluids/Electrolytes/Nutrition: Routine in and outs with follow-up  chemistries  -11-21: Sodium 133, repeat in a.m. for trend.  Otherwise labs stable.  11-22: Improved 134 today; next labs on Monday, monitor for cognitive changes  -Sodium up to 136 on 11/25  -12-2: Sodium 139; other labs stable.  P.o. intake adequate.  Monitor.  12-5: Sodium, BUN/creatinine stable.  Albumin increasing.  12/9: Mild hyponatremia, no acute intervention.  8.  Colitis versus ileitis-acute appendicitis ruled out.  Follow-up outpatient general surgery  11-24: Remains with frequent loose bowel movements overnight.  Lactulose held/as needed.  Will need to continue targeted bowel  movements at least 2 to 3/day, however did add Imodium for excessive bowel movements.  No current concern for infection, vital stable and no white count.  11-25/26: Unchanged, ongoing.  Questional contribution of cognitive deficits.  Bowel movements do seem to be slowing down.  12-2-3: Once daily liquid to soft bowel movements, remain incontinent - resolved  9.  Hypertension/hypotension.  Blood pressure remains a bit soft remains on ProAmatine 10 mg 3 times daily as well as low-dose Coreg 12.5 mg twice daily.   - 11/21: Diastolic hypotension as below; resume midodrine 5 mg 2 times daily and wean as tolerated -stable 11-23  11/29 BP  well-controlled, continue current regimen.  Continue midodrine  12/1 BP controlled, continue current regimen  12-2: Has remained normotensive in mid 30s; decrease midodrine to 2.5 mg twice daily, monitor 1 to 2 days before discontinuing  12-3: Has remained normotensive, discontinue midodrine  12/4-5: Stable/normotensive off of midodrine.  Continue coreg - stable on current regimen     11/28/2023    5:52 AM 11/27/2023    7:41 PM 11/27/2023    1:15 PM  Vitals with BMI  Systolic 123 136 147  Diastolic 70 60 62  Pulse 82 80 74    10.  Chronic lymphedema.  Follow-up outpatient.  -PT order for lymphedema wrapping and assistance and education, management 11-22 -11-23: Per PT,  only 1 PT in the hospital provide lymphedema management, looking into getting them in the room to assist in education this coming week -11/24 nursing providing Ace wrapping to bilateral lower extremities, does appear to be helping. 11/25 reports gradually improving, continue current regimen 11-26: PT Elray Mcgregor applied lymphedema wraps and educated patient on care 11-25; very much appreciate her help in this!  Patient comfortable, continue current management; PT to follow-up for tolerance and rewrap as needed and follow measurements until stable. 12/1 PT reports improving, continue current regimen -lymphedema wraps to be redone 12-2, with ongoing improvement. 12-3: Per PT, could benefit from toenail cutting for wrapping, unfortunately not available in house.  Discussed with patient outpatient follow-up for toenail management. 12/9: Per PT, wraps changed, encouraged to leave wraps on till late Wednesday (12/11) or early Thursday (12/12) and remove hoping he gets an appointment for follow up soon.  Will need outpatient referral for lymphedema management; order placed  11.History of alcohol use.  Counseling  12.  Tobacco use.  Counseling  13.  Obesity.  BMI 37.08.  Dietary follow-up  14.  Prediabetes.  Hemoglobin A1c 5.8. d/c ISS and start metformin ER release with dinner, creatinine reviewed and is stable -Blood sugars well-controlled, DC BG checks  11/25 glucose stable on BMP  15.  BPH/bladder incontinence/bowel incontinence.  Flomax 0.4 mg daily. BP reviewed and is stable -resolved  -11-23: DC Flomax, continue timed toileting and PVRs  11-24: PVRs remain low, remains continent during the day and incontinent at night.  Continue timed toileting.  11-26: Uncertain what barriers to continence are at this time, patient states that he sleeps through episodes of incontinence although he does feel the urge to go and knows to call the nurse for assistance.  Labs stable as above.  Will apply overnight  pulse ox to assess for OSA/disordered sleep.  11-27: OSA evaluation nondiagnostic as above.  Incontinence that seems to be improving as bowel movements slow down.  Continue timed toileting and monitoring.  11/30 Occasional incontinence,  recheck PVR- 193, 220. Will retry flomax  12-2: Ongoing incontinence, PVRs mid 100s.  continue Flomax  12/3: PVRs stable, DC   12-4: Some improved continence overnight! Ongoing 12/5-6  12/8: All continent this weekend   16. Hyperammonemia.  Hepatic steatosis seen on intake CT.  - 11/21: Seems more encephalopathic today, last ammonia was increased from 40s to 60s, add on to a.m. labs and resume lactulose 7.5 mg twice daily if worsening  11-22: Ammonia within normal limits.  Continues having 4-5 stools per day.  Change lactulose to 10 mg twice daily as needed to target at least 3 stools per day.  Cognition does seem to be improving.  11-30 continue current dose lactulose 10g BID PRN  12/4: Some mild cognitive deficits this a.m., which resolved quickly with improved arousal and stimulation.  Repeat labs, ammonia in AM - pending  12-5: Per SLP, cognition improved back to baseline this a.m.   12/6: Labs yesterday significant for ammonia increased from 30s to 55.  T. bili also increased, as expected.  Discussed with patient need for as needed lactulose to reduce ammonia, titrating to 3 bowel movements daily.  Informed nursing as well to administer.  Recheck on a.m. labs Monday.  12/9: -Ammonia increased to 79, t bili down, as needed lactulose administered today.   12/10: Cognition intact/unchanged.  Educated patient on titrating towards 3-4 bowel movements per day with as needed lactulose; discussed its role in management of ammonia; patient agreeable to this and understanding on discharge  17. Anemia.  hemoglobin decreased on intake from 12,6 to 10.7.  FOBT, H&H in AM. -11-22: Hemoglobin improved to 11.4, FOBT pending but no obvious blood in stool. - FOBT negative  -  Hemoglobin stable 12-2 - recheck 12/5, 12/9 stable  18. Dry skin: continue Eucerin  LOS: 20 days A FACE TO FACE EVALUATION WAS PERFORMED  Angelina Sheriff 11/28/2023, 12:46 PM

## 2023-11-28 NOTE — Progress Notes (Signed)
Patient ID: Brian [name], male   DOB: July 16, 1961, 62 y.o.   MRN: [id number]  SW received message from pt wife requesting a 3in1 BSC. SW spoke with pt wife informing that will need to speak with therapy  team to confirm, and SW will follow-up.   SW sent charity HHPT/OT referral to [facility] and waiting on follow-up.  [id number] returned phone call to pt wife informing 3in1 BSC ordered and item will be shipped to the home.   *Pt declined for charity HH.  Cecile Sheerer, MSW, LCSW Office: [phone] Cell: [phone] Fax: [phone]

## 2023-11-29 NOTE — Progress Notes (Signed)
Inpatient Rehabilitation Care Coordinator Discharge Note   Patient Details  Name: Brian [name] MRN: [id number] Date of Birth: January 28, 1961   Discharge location: D/c to home  Length of Stay: 19 days  Discharge activity level: CGA  Home/community participation: Limited  Patient response UX:LKGMWN Literacy - How often do you need to have someone help you when you read instructions, pamphlets, or other written material from your doctor or pharmacy?: Rarely  Patient response UU:VOZDGU Isolation - How often do you feel lonely or isolated from those around you?: Never  Services provided included: MD, RD, PT, OT, SLP, CM, Pharmacy, Neuropsych, SW, TR, RN  Financial Services:  Field seismologist Utilized: Other (Comment) (Self-pay; medicaid pending)    Choices offered to/list presented to: N/A  Follow-up services arranged:  DME      DME : [employer] for TTB and 3in1 BSC    Patient response to transportation need: Is the patient able to respond to transportation needs?: Yes In the past 12 months, has lack of transportation kept you from medical appointments or from getting medications?: No In the past 12 months, has lack of transportation kept you from meetings, work, or from getting things needed for daily living?: No   Patient/Family verbalized understanding of follow-up arrangements:  Yes  Individual responsible for coordination of the follow-up plan: contact pt wife Cala Bradford [phone]  Confirmed correct DME delivered: Gretchen Short 11/29/2023    Comments (or additional information):fam edu completed  Summary of Stay    Date/Time Discharge Planning CSW  11/27/23 0934 Pt is uninsured. Pt to d/c to home with support from his wife and she is unable to physically assist him; their son who can help PRN as he works 12hr shifts. Fam edu completed. SW ordered TTB with charity DME ([employer]). Pt set up for [facility] medication assistance program. Will submit charity  HH refrral prior to discharge; if unable to secure, pt will still be sent home with HEP.  SW will confirm there are no barriers to discharge. AAC  11/20/23 1009 Pt is uninsured. Pt to d/c to home with support from his wife and she is unable to physically assist him; their son who can help PRN as he works 12hr shifts. SW will confirm there are no barriers to discharge. AAC  11/14/23 0922 Pt is uninsured. Pt to d/c to home with support from his wife, and their son who can help PRN as he works 12hr shifts. SW will confirm there are no barriers to discharge. AAC       Aarnav Steagall A Lula Olszewski

## 2024-01-01 ENCOUNTER — Encounter: Payer: Self-pay | Attending: Physical Medicine and Rehabilitation | Admitting: Physical Medicine and Rehabilitation

## 2024-01-20 DEATH — deceased
